# Patient Record
Sex: Female | Born: 1935 | Race: White | Hispanic: No | Smoking: Current every day smoker
Health system: Southern US, Community
[De-identification: ages and names within clinical notes are randomized; demographics above are authoritative.]

## PROBLEM LIST (undated history)

## (undated) DIAGNOSIS — R911 Solitary pulmonary nodule: Secondary | ICD-10-CM

## (undated) DIAGNOSIS — I480 Paroxysmal atrial fibrillation: Secondary | ICD-10-CM

## (undated) DIAGNOSIS — J449 Chronic obstructive pulmonary disease, unspecified: Secondary | ICD-10-CM

## (undated) DIAGNOSIS — Z720 Tobacco use: Secondary | ICD-10-CM

## (undated) HISTORY — PX: SKIN GRAFT: SHX250

## (undated) HISTORY — PX: PACEMAKER INSERTION: SHX728

## (undated) HISTORY — DX: Solitary pulmonary nodule: R91.1

## (undated) HISTORY — PX: SOFT TISSUE TUMOR RESECTION: SHX1054

## (undated) HISTORY — DX: Paroxysmal atrial fibrillation: I48.0

## (undated) HISTORY — DX: Tobacco use: Z72.0

## (undated) HISTORY — DX: Chronic obstructive pulmonary disease, unspecified: J44.9

---

## 1997-12-14 ENCOUNTER — Ambulatory Visit (HOSPITAL_COMMUNITY): Admission: RE | Admit: 1997-12-14 | Discharge: 1997-12-14 | Payer: Self-pay | Admitting: Cardiology

## 1999-04-09 ENCOUNTER — Ambulatory Visit (HOSPITAL_COMMUNITY): Admission: RE | Admit: 1999-04-09 | Discharge: 1999-04-09 | Payer: Self-pay | Admitting: Family Medicine

## 1999-04-09 ENCOUNTER — Encounter: Payer: Self-pay | Admitting: Family Medicine

## 1999-05-11 ENCOUNTER — Other Ambulatory Visit: Admission: RE | Admit: 1999-05-11 | Discharge: 1999-05-11 | Payer: Self-pay | Admitting: Family Medicine

## 1999-11-20 ENCOUNTER — Inpatient Hospital Stay (HOSPITAL_COMMUNITY): Admission: EM | Admit: 1999-11-20 | Discharge: 1999-11-23 | Payer: Self-pay | Admitting: *Deleted

## 2000-01-08 ENCOUNTER — Encounter: Admission: RE | Admit: 2000-01-08 | Discharge: 2000-01-08 | Payer: Self-pay | Admitting: Family Medicine

## 2000-01-08 ENCOUNTER — Encounter: Payer: Self-pay | Admitting: Family Medicine

## 2000-08-20 ENCOUNTER — Ambulatory Visit (HOSPITAL_COMMUNITY): Admission: RE | Admit: 2000-08-20 | Discharge: 2000-08-20 | Payer: Self-pay | Admitting: Cardiology

## 2000-09-09 ENCOUNTER — Ambulatory Visit (HOSPITAL_COMMUNITY): Admission: RE | Admit: 2000-09-09 | Discharge: 2000-09-09 | Payer: Self-pay | Admitting: Cardiology

## 2001-08-13 ENCOUNTER — Ambulatory Visit (HOSPITAL_COMMUNITY): Admission: RE | Admit: 2001-08-13 | Discharge: 2001-08-13 | Payer: Self-pay | Admitting: Cardiology

## 2001-10-16 ENCOUNTER — Encounter: Payer: Self-pay | Admitting: Family Medicine

## 2001-10-16 ENCOUNTER — Encounter: Admission: RE | Admit: 2001-10-16 | Discharge: 2001-10-16 | Payer: Self-pay | Admitting: Family Medicine

## 2001-11-12 ENCOUNTER — Ambulatory Visit (HOSPITAL_COMMUNITY): Admission: RE | Admit: 2001-11-12 | Discharge: 2001-11-12 | Payer: Self-pay | Admitting: Internal Medicine

## 2001-11-12 ENCOUNTER — Encounter: Payer: Self-pay | Admitting: Internal Medicine

## 2001-12-31 ENCOUNTER — Encounter (HOSPITAL_COMMUNITY): Admission: RE | Admit: 2001-12-31 | Discharge: 2002-03-31 | Payer: Self-pay | Admitting: Family Medicine

## 2002-04-14 ENCOUNTER — Encounter (HOSPITAL_COMMUNITY): Admission: RE | Admit: 2002-04-14 | Discharge: 2002-07-13 | Payer: Self-pay | Admitting: Family Medicine

## 2002-04-16 ENCOUNTER — Encounter: Payer: Self-pay | Admitting: Internal Medicine

## 2002-04-16 ENCOUNTER — Ambulatory Visit (HOSPITAL_COMMUNITY): Admission: RE | Admit: 2002-04-16 | Discharge: 2002-04-16 | Payer: Self-pay | Admitting: Internal Medicine

## 2002-09-29 ENCOUNTER — Encounter: Admission: RE | Admit: 2002-09-29 | Discharge: 2002-09-29 | Payer: Self-pay | Admitting: Family Medicine

## 2002-09-29 ENCOUNTER — Encounter: Payer: Self-pay | Admitting: Family Medicine

## 2003-07-04 ENCOUNTER — Encounter: Admission: RE | Admit: 2003-07-04 | Discharge: 2003-07-04 | Payer: Self-pay | Admitting: Internal Medicine

## 2003-09-07 ENCOUNTER — Encounter: Admission: RE | Admit: 2003-09-07 | Discharge: 2003-09-07 | Payer: Self-pay | Admitting: Family Medicine

## 2004-11-12 ENCOUNTER — Encounter: Admission: RE | Admit: 2004-11-12 | Discharge: 2004-11-12 | Payer: Self-pay | Admitting: General Surgery

## 2004-11-13 ENCOUNTER — Encounter (INDEPENDENT_AMBULATORY_CARE_PROVIDER_SITE_OTHER): Payer: Self-pay | Admitting: *Deleted

## 2004-11-13 ENCOUNTER — Ambulatory Visit (HOSPITAL_COMMUNITY): Admission: RE | Admit: 2004-11-13 | Discharge: 2004-11-13 | Payer: Self-pay | Admitting: General Surgery

## 2004-11-13 ENCOUNTER — Ambulatory Visit (HOSPITAL_BASED_OUTPATIENT_CLINIC_OR_DEPARTMENT_OTHER): Admission: RE | Admit: 2004-11-13 | Discharge: 2004-11-13 | Payer: Self-pay | Admitting: General Surgery

## 2004-11-21 ENCOUNTER — Encounter: Admission: RE | Admit: 2004-11-21 | Discharge: 2004-11-21 | Payer: Self-pay | Admitting: General Surgery

## 2004-12-11 ENCOUNTER — Ambulatory Visit: Payer: Self-pay | Admitting: Internal Medicine

## 2005-04-06 ENCOUNTER — Inpatient Hospital Stay (HOSPITAL_COMMUNITY): Admission: EM | Admit: 2005-04-06 | Discharge: 2005-04-10 | Payer: Self-pay | Admitting: *Deleted

## 2005-04-08 ENCOUNTER — Encounter (INDEPENDENT_AMBULATORY_CARE_PROVIDER_SITE_OTHER): Payer: Self-pay | Admitting: Cardiology

## 2005-04-26 ENCOUNTER — Ambulatory Visit: Payer: Self-pay | Admitting: Internal Medicine

## 2005-04-30 ENCOUNTER — Ambulatory Visit: Payer: Self-pay | Admitting: Cardiology

## 2005-06-10 ENCOUNTER — Ambulatory Visit: Payer: Self-pay | Admitting: Internal Medicine

## 2005-08-19 ENCOUNTER — Ambulatory Visit: Payer: Self-pay | Admitting: Internal Medicine

## 2005-11-07 ENCOUNTER — Other Ambulatory Visit: Admission: RE | Admit: 2005-11-07 | Discharge: 2005-11-07 | Payer: Self-pay | Admitting: Family Medicine

## 2005-11-08 ENCOUNTER — Encounter: Admission: RE | Admit: 2005-11-08 | Discharge: 2005-11-08 | Payer: Self-pay | Admitting: Family Medicine

## 2005-11-19 ENCOUNTER — Ambulatory Visit: Payer: Self-pay | Admitting: Internal Medicine

## 2005-11-20 ENCOUNTER — Ambulatory Visit: Payer: Self-pay | Admitting: Cardiology

## 2006-03-20 ENCOUNTER — Ambulatory Visit: Payer: Self-pay | Admitting: Internal Medicine

## 2006-04-10 ENCOUNTER — Encounter (HOSPITAL_COMMUNITY): Admission: RE | Admit: 2006-04-10 | Discharge: 2006-07-09 | Payer: Self-pay | Admitting: Internal Medicine

## 2006-06-03 ENCOUNTER — Inpatient Hospital Stay (HOSPITAL_COMMUNITY): Admission: EM | Admit: 2006-06-03 | Discharge: 2006-06-05 | Payer: Self-pay | Admitting: Emergency Medicine

## 2006-06-04 ENCOUNTER — Encounter (INDEPENDENT_AMBULATORY_CARE_PROVIDER_SITE_OTHER): Payer: Self-pay | Admitting: Interventional Cardiology

## 2006-06-15 ENCOUNTER — Inpatient Hospital Stay (HOSPITAL_COMMUNITY): Admission: EM | Admit: 2006-06-15 | Discharge: 2006-06-19 | Payer: Self-pay | Admitting: Emergency Medicine

## 2006-06-15 ENCOUNTER — Ambulatory Visit: Payer: Self-pay | Admitting: Internal Medicine

## 2006-06-15 ENCOUNTER — Encounter (INDEPENDENT_AMBULATORY_CARE_PROVIDER_SITE_OTHER): Payer: Self-pay | Admitting: Cardiology

## 2006-07-09 ENCOUNTER — Ambulatory Visit: Payer: Self-pay

## 2006-07-16 ENCOUNTER — Ambulatory Visit: Payer: Self-pay | Admitting: Internal Medicine

## 2006-09-18 ENCOUNTER — Ambulatory Visit: Payer: Self-pay | Admitting: Internal Medicine

## 2007-03-17 ENCOUNTER — Ambulatory Visit: Payer: Self-pay | Admitting: Internal Medicine

## 2007-09-11 DIAGNOSIS — J439 Emphysema, unspecified: Secondary | ICD-10-CM

## 2007-09-11 DIAGNOSIS — I4891 Unspecified atrial fibrillation: Secondary | ICD-10-CM

## 2007-09-11 DIAGNOSIS — F17200 Nicotine dependence, unspecified, uncomplicated: Secondary | ICD-10-CM | POA: Insufficient documentation

## 2007-09-11 DIAGNOSIS — J984 Other disorders of lung: Secondary | ICD-10-CM | POA: Insufficient documentation

## 2007-10-01 ENCOUNTER — Ambulatory Visit: Payer: Self-pay | Admitting: Internal Medicine

## 2007-10-08 ENCOUNTER — Encounter: Payer: Self-pay | Admitting: Internal Medicine

## 2007-12-29 ENCOUNTER — Telehealth: Payer: Self-pay | Admitting: Internal Medicine

## 2007-12-29 ENCOUNTER — Ambulatory Visit: Payer: Self-pay | Admitting: Internal Medicine

## 2007-12-29 DIAGNOSIS — J069 Acute upper respiratory infection, unspecified: Secondary | ICD-10-CM | POA: Insufficient documentation

## 2008-03-29 ENCOUNTER — Ambulatory Visit: Payer: Self-pay | Admitting: Internal Medicine

## 2008-09-26 ENCOUNTER — Ambulatory Visit: Payer: Self-pay | Admitting: Internal Medicine

## 2009-03-06 ENCOUNTER — Ambulatory Visit: Payer: Self-pay | Admitting: Internal Medicine

## 2009-04-05 ENCOUNTER — Ambulatory Visit: Payer: Self-pay | Admitting: Internal Medicine

## 2009-05-05 ENCOUNTER — Ambulatory Visit: Payer: Self-pay | Admitting: Internal Medicine

## 2009-09-04 ENCOUNTER — Ambulatory Visit: Payer: Self-pay | Admitting: Internal Medicine

## 2009-09-13 ENCOUNTER — Telehealth: Payer: Self-pay | Admitting: Internal Medicine

## 2009-11-27 ENCOUNTER — Telehealth: Payer: Self-pay | Admitting: Internal Medicine

## 2010-03-05 ENCOUNTER — Ambulatory Visit: Payer: Self-pay | Admitting: Internal Medicine

## 2010-04-02 ENCOUNTER — Ambulatory Visit: Payer: Self-pay | Admitting: Internal Medicine

## 2010-04-09 ENCOUNTER — Ambulatory Visit: Payer: Self-pay | Admitting: Internal Medicine

## 2010-06-14 LAB — PROTIME-INR

## 2010-07-20 ENCOUNTER — Ambulatory Visit
Admission: RE | Admit: 2010-07-20 | Discharge: 2010-07-20 | Payer: Self-pay | Source: Home / Self Care | Attending: Internal Medicine | Admitting: Internal Medicine

## 2010-07-20 ENCOUNTER — Encounter: Payer: Self-pay | Admitting: Internal Medicine

## 2010-07-27 ENCOUNTER — Telehealth (INDEPENDENT_AMBULATORY_CARE_PROVIDER_SITE_OTHER): Payer: Self-pay | Admitting: *Deleted

## 2010-08-08 ENCOUNTER — Ambulatory Visit: Admission: RE | Admit: 2010-08-08 | Discharge: 2010-08-08 | Payer: Self-pay | Source: Home / Self Care

## 2010-08-08 LAB — CONVERTED CEMR LAB: POC INR: 2.2

## 2010-08-14 NOTE — Progress Notes (Signed)
Summary: need to change xopenex to Maxair  Phone Note Outgoing Call   Call placed by: Carron Curie CMA,  Nov 27, 2009 11:20 AM Call placed to: insurance Summary of Call: Insurance comapny is requiring pt try MAxair before Xopenex will be approved. Pelase advsie if ok to change to maxair, if so please provide directions. Thanks.  Initial call taken by: Carron Curie CMA,  Nov 27, 2009 11:21 AM  Follow-up for Phone Call        OK to try Maxair, # 1, 2 puffs four times a day as needed rescue inhaler, refill as needed. Follow-up by: Waymon Budge MD,  Nov 28, 2009 10:08 AM  Additional Follow-up for Phone Call Additional follow up Details #1::        pt advised of change and rx sent.Carron Curie CMA  Nov 28, 2009 10:21 AM     New/Updated Medications: MAXAIR AUTOHALER 200 MCG/INH AERB (PIRBUTEROL ACETATE) 2 puffs four times a day as needed Prescriptions: MAXAIR AUTOHALER 200 MCG/INH AERB (PIRBUTEROL ACETATE) 2 puffs four times a day as needed  #1 x prn   Entered by:   Carron Curie CMA   Authorized by:   Waymon Budge MD   Signed by:   Carron Curie CMA on 11/28/2009   Method used:   Electronically to        [id number] (retail)       [address]       [place], [place]  [address]       Ph: [phone] or [phone]       Fax: [phone]   RxID:   [id number]

## 2010-08-14 NOTE — Assessment & Plan Note (Signed)
Summary: NP follow up - COPD   Primary Provider/Referring Provider:  Arvilla Market  CC:  1 week follow up - still prod cough and congestion.  finished abx and pred taper yesterday.  History of Present Illness: 03/06/09- COPD, CAD Acutely ill past 3 days- sore throat, then weakness, cough and dyspnea. Anorexia without nausea, vomiting or diarrhea. Denies fever, chills, headache. Scant thick wihite sputum.   May 05, 2009- COPD, CAD Needed to return to NP after my last visit, but feels she is doing well now. Today is baseline and she expects most days to have cough and phlegm. We discussed a trial of a cortisone inhaler with or instead of Spiriva. Wants flu vax.  September 04, 2009- COPD, CAD Pleased that she got flu vax. Breathing has been stable. Not worse.  Off coumadin. She uses Xopenex HFA maybe twice daily. She thinks Qvar is helping more than the Spiriva did. .Denies chest pains or productive cough. Dyspnea on stairs.  March 05, 2010- - COPD, CAD, tobacco Insurance wouldn't cover Xopenex, so we folowed their lead and switched to Maxair, used about twice daily.. She says this is too expensive. She had ablation then a pacemaker, so the Xopenex issue should no longer be important. Uses Qvar every evening. In last 2 months has had productive cough with clear mucus, but denies wheeze. She feels DOE is gradually worse, but is comfortable at rest or sleep. She continues to smoke 1 PPD despie all our efforts over the years. i explained this as a major reason for productive cough and worsening dyspnea.  April 02, 2010--Presents for an acute office visit. Complains of sore throat, fatigue, increased SOB,  prod cough, wheezing, chills/sweats x5days. Has had low grade fever.>>tx w/  augmentin/steroid taper.   April 09, 2010---Presents for 1 week follow up - still prod cough, congestion.  finished abx and pred taper yesterday She feels better but not totally gone. Feels cough is just lingering  on, wearing her down. Wheezing and dsypnea are less but not totally gone. Mucus is clearing. Denies chest pain,  orthopnea, hemoptysis, fever, n/v/d, edema, headache.   Medications Prior to Update: 1)  Coumadin 5 Mg  Tabs (Warfarin Sodium) .... As Directed 2)  Hydrochlorothiazide 25 Mg  Tabs (Hydrochlorothiazide) .... Take 1/2 Tablet By Mouth Once Daily 3)  Klor-Con 10 10 Meq  Tbcr (Potassium Chloride) .... Take 1 Tablet By Mouth Once A Day 4)  Maxair Autohaler 200 Mcg/inh Aerb (Pirbuterol Acetate) .... 2 Puffs Four Times A Day As Needed 5)  Qvar 80 Mcg/act Aers (Beclomethasone Dipropionate) .... 2 Puffs and Rinse Twice Every Day 6)  Lexapro 10 Mg Tabs (Escitalopram Oxalate) .... Take 1 By Mouth Once Daily 7)  Prednisone 10 Mg Tabs (Prednisone) .... 4 Tabs For 2 Days, Then 3 Tabs For 2 Days, 2 Tabs For 2 Days, Then 1 Tab For 2 Days, Then Stop 8)  Augmentin 875-125 Mg Tabs (Amoxicillin-Pot Clavulanate) .Marland Kitchen.. 1 By Mouth Two Times A Day 9)  Hydromet 5-1.5 Mg/71ml Syrp (Hydrocodone-Homatropine) .... 1/2-1 Tsp Every 4-6 Hr As Needed Cough, May Make You Sleepy  Current Medications (verified): 1)  Coumadin 5 Mg  Tabs (Warfarin Sodium) .... As Directed 2)  Hydrochlorothiazide 25 Mg  Tabs (Hydrochlorothiazide) .... Take 1/2 Tablet By Mouth Once Daily 3)  Klor-Con 10 10 Meq  Tbcr (Potassium Chloride) .... Take 1 Tablet By Mouth Once A Day 4)  Maxair Autohaler 200 Mcg/inh Aerb (Pirbuterol Acetate) .... 2 Puffs Four Times A Day As Needed  5)  Qvar 80 Mcg/act Aers (Beclomethasone Dipropionate) .... 2 Puffs and Rinse Twice Every Day 6)  Lexapro 10 Mg Tabs (Escitalopram Oxalate) .... Take 1 By Mouth Once Daily 7)  Hydromet 5-1.5 Mg/39ml Syrp (Hydrocodone-Homatropine) .... 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  Family History: Last updated: 12/29/2007 negative respiratory disease  Social History: Last updated: 04/02/2010 Patient is a current smoker 1ppd, x60yrs- counseling Divorced, one daughter 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  finished abx and pred taper yesterday Is Patient Diabetic? No Comments Medications reviewed with patient Daytime contact number verified with patient. Boone Master CNA/MA  April 09, 2010 11:24 AM    Physical Exam  Additional Exam:  General: A/Ox3; pleasant and cooperative, NAD,  SKIN: persistent small vasular lesions or avms on face- mala. NODES: no lymphadenopathy HEENT: [place]/AT, EOM- WNL, Conjuctivae- clear, PERRLA, TM-WNL, Nose- clear, Throat- clear and wnl, Mallampati II, dentures NECK: Supple w/ fair ROM, JVD- none, normal carotid impulses w/o bruits Thyroid- CHEST: coarse BS w/ no wheezing.  HEART: RRR, no m/g/r heard ABDOMEN: Soft and nl;  ZOX:WRUE, nl pulses, no edema  NEURO: Grossly intact to observation      Impression & Recommendations:  Problem # 1:  COPD (ICD-496) Exacerbation -slow to resolve.  Plan:  depo medrol IM , and xopenex neb in  office  Mucinex DM two times a day as needed cough/congestion Hydromet 1/2 -1 tsp every 4-6 hrs as needed cough, may make you sleepy.  Increase fluids.  and rest.   Please contact office for sooner follow up if symptoms do not improve or worsen  follow up 4  weeks with Dr. Maple Hudson and as needed   Complete Medication List: 1)  Coumadin 5 Mg Tabs (Warfarin sodium) .... As directed 2)  Hydrochlorothiazide 25 Mg Tabs (Hydrochlorothiazide) .... Take 1/2 tablet by mouth once daily 3)  Klor-con 10 10 Meq Tbcr (Potassium chloride) .... Take 1 tablet by mouth once a day 4)  Maxair Autohaler 200 Mcg/inh Aerb (Pirbuterol acetate) .... 2 puffs four times a day as needed 5)  Qvar 80 Mcg/act Aers (Beclomethasone dipropionate) .... 2 puffs and rinse twice every day 6)  Lexapro 10 Mg Tabs (Escitalopram oxalate) .... Take 1 by mouth once daily 7)  Hydromet 5-1.5 Mg/63ml Syrp (Hydrocodone-homatropine) .... 1/2-1 tsp every 4-6 hr as needed cough, may make you sleepy  Other Orders: Admin of Therapeutic Inj  intramuscular or subcutaneous (58527) Depo- Medrol 80mg  (J1040) Depo- Medrol 40mg  (J1030) Nebulizer Tx (78242) Est. Patient Level IV (35361)  Patient Instructions: 1)  Mucinex DM two times a day as needed cough/congestion 2)  Hydromet 1/2 -1 tsp every 4-6 hrs as needed cough, may make you sleepy.  3)  Increase fluids.  and rest.  4)   Please contact office for sooner follow up if symptoms do not improve or worsen  5)  follow up 4  weeks with Dr. Maple Hudson and as needed      Medication Administration  Injection # 1:    Medication: Depo- Medrol 40mg     Diagnosis: COPD (ICD-496)    Route: IM    Site: LUOQ gluteus    Exp Date: 10-2012    Lot #: [id number]    Mfr: [employer]    Comments: injection given by Reynaldo Minium, CMA    Patient tolerated injection without complications  Injection # 2:    Medication: Depo- Medrol 80mg     Diagnosis: COPD (ICD-496)    Route: IM    Site: LUOQ gluteus    Exp Date:  10-2012    Lot #: [id number]    Mfr: [employer]    Comments: injection given by Reynaldo Minium, CMA    Patient tolerated injection without complications  Orders Added: 1)  Admin of Therapeutic Inj  intramuscular or subcutaneous [96372] 2)  Depo- Medrol 80mg  [J1040] 3)  Depo- Medrol 40mg  [J1030] 4)  Nebulizer Tx [44315] 5)  Est. Patient Level IV [40086]

## 2010-08-14 NOTE — Assessment & Plan Note (Signed)
Summary: 6 months/apc   Primary Provider/Referring Provider:  Arvilla Market  CC:  Follow up visit.  History of Present Illness:  09/26/08- COPDCAD Main c/o dyspnea and "legs like jelly". Failed pulmonary rehab when it aggravated back trouble. Breathing limits her walking. Peripheral vascular diease she says was ruled out. Heart/ pacemaker is stable. Little routine cough and no chest pain. Feels comforatably stable within her limiting dyspnea.  03/06/09- COPD, CAD Acutely ill past 3 days- sore throat, then weakness, cough and dyspnea. Anorexia without nausea, vomiting or diarrhea. Denies fever, chills, headache. Scant thick wihite sputum.   May 05, 2009- COPD, CAD Needed to return to NP after my last visit, but feels she is doing well now. Today is baseline and she expects most days to have cough and phlegm. We discussed a trial of a cortisone inhaler with or instead of Spiriva. Wants flu vax.  September 04, 2009- COPD, CAD Pleased that she got flu vax. Breathing has been stable. Not worse.  Off coumadin. She uses Xopenex HFA maybe twice daily. She thinks Qvar is helping more than the Spiriva did .Denies chest pains or productive cough. Dyspnea on stairs.     Current Medications (verified): 1)  Coumadin 5 Mg  Tabs (Warfarin Sodium) .... As Directed 2)  Hydrochlorothiazide 25 Mg  Tabs (Hydrochlorothiazide) .... Take 1/2 Tablet By Mouth Once Daily 3)  Klor-Con 10 10 Meq  Tbcr (Potassium Chloride) .... Take 1 Tablet By Mouth Once A Day 4)  Xopenex Hfa 45 Mcg/act  Aero (Levalbuterol Tartrate) .... Inhale 2 Puffs Every 4 Hours As Needed 5)  Qvar 80 Mcg/act Aers (Beclomethasone Dipropionate) .... 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  The patient denies anorexia, fever, weight loss, weight gain, vision loss, decreased hearing, hoarseness, chest pain, syncope, peripheral edema, prolonged cough, headaches, hemoptysis, abdominal pain, and severe indigestion/heartburn.    Vital Signs:  Patient profile:   77 year old female Height:      64.5 inches Weight:      180.38 pounds O2 Sat:      98 % on Room air Pulse rate:   86 / minute BP sitting:   104 / 78  (left arm) Cuff size:   large  Vitals Entered By: Reynaldo Minium CMA (September 04, 2009 10:44 AM)  O2 Flow:  Room air  Physical Exam  Additional Exam:  General: A/Ox3; pleasant and cooperative, NAD, looks  comfortable and seems cheerier than she has in a while. SKIN: persistent small vasular lesions or avms on face- mala. NODES: no lymphadenopathy HEENT: [place]/AT, EOM- WNL, Conjuctivae- clear, PERRLA, TM-WNL, Nose- clear, Throat- clear and wnl, Melampatti II, dentures NECK: Supple w/ fair ROM, JVD- none, normal carotid impulses w/o bruits Thyroid- CHEST:Minimal cough and trace  endexpiratory wheeze HEART: RRR, no m/g/r heard ABDOMEN: Soft and nl;  VWU:[id number], nl pulses, no edema  NEURO: Grossly intact to observation      Impression & Recommendations:  Problem # 1:  COPD (ICD-496) Very stable despite ongoing smoking with little change, despite discussions and education on medical issues  and  support.  Problem # 2:  PULMONARY NODULE (ICD-518.89)  CXR 3/10 showed pacemaker but did not see the nodule. We wil update the cxr.  Other Orders: Est. Patient Level III (91478) T-2 View CXR (71020TC)  Patient Instructions: 1)  Please schedule a follow-up appointment in 6 months. 2)  A chest x-ray has been recommended.  Your imaging study may require preauthorization.  3)  Please do yourself a favor and work hard to quit the smoking.

## 2010-08-14 NOTE — Assessment & Plan Note (Signed)
Summary: Acute NP office visit - COPD   Primary Provider/Referring Provider:  Arvilla Market  CC:  sore throat, fatigue, increased SOB, prod cough, wheezing, and chills/sweats x5days - .  History of Present Illness: 03/06/09- COPD, CAD Acutely ill past 3 days- sore throat, then weakness, cough and dyspnea. Anorexia without nausea, vomiting or diarrhea. Denies fever, chills, headache. Scant thick wihite sputum.   May 05, 2009- COPD, CAD Needed to return to NP after my last visit, but feels she is doing well now. Today is baseline and she expects most days to have cough and phlegm. We discussed a trial of a cortisone inhaler with or instead of Spiriva. Wants flu vax.  September 04, 2009- COPD, CAD Pleased that she got flu vax. Breathing has been stable. Not worse.  Off coumadin. She uses Xopenex HFA maybe twice daily. She thinks Qvar is helping more than the Spiriva did. .Denies chest pains or productive cough. Dyspnea on stairs.  March 05, 2010- - COPD, CAD, tobacco Insurance wouldn't cover Xopenex, so we folowed their lead and switched to Maxair, used about twice daily.. She says this is too expensive. She had ablation then a pacemaker, so the Xopenex issue should no longer be important. Uses Qvar every evening. In last 2 months has had productive cough with clear mucus, but denies wheeze. She feels DOE is gradually worse, but is comfortable at rest or sleep. She continues to smoke 1 PPD despie all our efforts over the years. i explained this as a major reason for productive cough and worsening dyspnea.  April 02, 2010--Presents for an acute office visit. Complains of sore throat, fatigue, increased SOB,  prod cough, wheezing, chills/sweats x5days. Has had low grade fever. Denies chest pain,  orthopnea, hemoptysis, n/v/d, edema, headache. No otc used. Contnues to smoke.    Medications Prior to Update: 1)  Coumadin 5 Mg  Tabs (Warfarin Sodium) .... As Directed 2)  Hydrochlorothiazide 25  Mg  Tabs (Hydrochlorothiazide) .... Take 1/2 Tablet By Mouth Once Daily 3)  Klor-Con 10 10 Meq  Tbcr (Potassium Chloride) .... Take 1 Tablet By Mouth Once A Day 4)  Maxair Autohaler 200 Mcg/inh Aerb (Pirbuterol Acetate) .... 2 Puffs Four Times A Day As Needed 5)  Qvar 80 Mcg/act Aers (Beclomethasone Dipropionate) .... 2 Puffs and Rinse Twice Every Day 6)  Lexapro 10 Mg Tabs (Escitalopram Oxalate) .... Take 1 By Mouth Once Daily  Current Medications (verified): 1)  Coumadin 5 Mg  Tabs (Warfarin Sodium) .... As Directed 2)  Hydrochlorothiazide 25 Mg  Tabs (Hydrochlorothiazide) .... Take 1/2 Tablet By Mouth Once Daily 3)  Klor-Con 10 10 Meq  Tbcr (Potassium Chloride) .... Take 1 Tablet By Mouth Once A Day 4)  Maxair Autohaler 200 Mcg/inh Aerb (Pirbuterol Acetate) .... 2 Puffs Four Times A Day As Needed 5)  Qvar 80 Mcg/act Aers (Beclomethasone Dipropionate) .... 2 Puffs and Rinse Twice Every Day 6)  Lexapro 10 Mg Tabs (Escitalopram Oxalate) .... 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  Family History: Last updated: 12/29/2007 negative respiratory disease  Social History: Last updated: 04/02/2010 Patient is a current smoker 1ppd, x64yrs- counseling Divorced,  one daughter 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: Smoking Status: current (03/05/2010) Packs/Day: 1.0 (03/05/2010)  Social History: Patient is a current smoker 1ppd, x3yrs- counseling Divorced, one daughter 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 No Comments Medications reviewed with patient Daytime contact number verified with patient. Boone Master CNA/MA  April 02, 2010 3:29 PM    Physical Exam  Additional Exam:  General: A/Ox3; pleasant and cooperative, NAD,  SKIN: persistent small vasular lesions or avms on face- mala. NODES: no lymphadenopathy HEENT: [place]/AT, EOM- WNL, Conjuctivae- clear, PERRLA, TM-WNL, Nose- clear, Throat- clear and wnl, Mallampati II, dentures NECK: Supple w/ fair ROM, JVD- none, normal carotid impulses w/o bruits Thyroid- CHEST: coarse BS w/ no wheezing.  HEART: RRR, no m/g/r heard ABDOMEN: Soft and nl;  UJW:[id number], nl pulses, no edema  NEURO: Grossly intact to observation      Impression & Recommendations:  Problem # 1:  COPD (ICD-496) COPD flare w/ associated URI  Plan:  Augmentin 875mg  two times a day for 7 days Mucinex DM two times a day as needed cough/congestion Prednisone taper over next week.  Hydromet 1/2 -1 tsp every 4-6 hrs as needed cough, may make you sleepy.  Increase fluids.  and rest.  Tylenol as needed fever.  Please contact office for sooner follow up if symptoms do not improve or worsen  follow up 1 week. and as needed  Her updated medication list for this problem includes:    Hydromet 5-1.5 Mg/62ml Syrp (Hydrocodone-homatropine) .Marland Kitchen... 1/2-1 tsp every 4-6 hr as needed cough, may make you sleepy  Orders: Est. Patient Level III (47829)  Medications Added to Medication List This Visit: 1)  Prednisone 10 Mg Tabs (Prednisone) .... 4 tabs for 2 days,  then 3 tabs for 2 days, 2 tabs for 2 days, then 1 tab for 2 days, then stop 2)  Augmentin 875-125 Mg Tabs (Amoxicillin-pot clavulanate) .Marland Kitchen.. 1 by mouth two times a day 3)  Hydromet 5-1.5 Mg/35ml Syrp (Hydrocodone-homatropine) .... 1/2-1 tsp every 4-6 hr as needed cough, may make you sleepy  Complete Medication List: 1)  Coumadin 5 Mg Tabs (Warfarin sodium) .... As directed 2)  Hydrochlorothiazide 25 Mg Tabs (Hydrochlorothiazide) .... Take 1/2 tablet by mouth once daily 3)  Klor-con 10 10 Meq Tbcr (Potassium chloride) .... Take 1 tablet by mouth once a day 4)  Maxair Autohaler 200 Mcg/inh Aerb (Pirbuterol acetate) .... 2 puffs four times a day as needed 5)  Qvar 80 Mcg/act Aers (Beclomethasone dipropionate) .... 2 puffs and rinse twice every day 6)  Lexapro 10 Mg Tabs (Escitalopram oxalate) .... Take 1 by mouth once daily 7)  Prednisone 10 Mg Tabs (Prednisone) .... 4 tabs for 2 days, then 3 tabs for 2 days, 2 tabs for 2 days, then 1 tab for 2 days, then stop 8)  Augmentin  875-125 Mg Tabs (Amoxicillin-pot clavulanate) .Marland Kitchen.. 1 by mouth two times a day 9)  Hydromet 5-1.5 Mg/54ml Syrp (Hydrocodone-homatropine) .... 1/2-1 tsp every 4-6 hr as needed cough, may make you sleepy  Patient Instructions: 1)  Augmentin 875mg  two times a day for 7 days 2)  Mucinex DM two times a day as needed cough/congestion 3)  Prednisone taper over next week.  4)  Hydromet 1/2 -1 tsp every 4-6 hrs as needed cough, may make you sleepy.  5)  Increase fluids.  and rest.  6)  Tylenol as needed fever.  7)  Please contact office for sooner follow up if symptoms do not improve or worsen  8)  follow up 1 week. and as needed  Prescriptions: HYDROMET 5-1.5 MG/5ML SYRP (HYDROCODONE-HOMATROPINE) 1/2-1 tsp every 4-6 hr as needed cough, may make you sleepy  #8 oz x 0   Entered and Authorized by:   Rubye Oaks NP   Signed by:   Tammy Parrett NP on 04/02/2010   Method used:   Print then Give to Patient   RxID:    [id number] AUGMENTIN 875-125 MG TABS (AMOXICILLIN-POT CLAVULANATE) 1 by mouth two times a day  #14 x 0   Entered and Authorized by:   Rubye Oaks NP   Signed by:   Tammy Parrett NP on 04/02/2010   Method used:   Electronically to        [id number]       [address]       [place], [place]  [address]       Ph: [phone] or [phone]       Fax: [phone]   RxID:   [id number] PREDNISONE 10 MG TABS (PREDNISONE) 4 tabs for 2 days, then 3 tabs for 2 days, 2 tabs for 2 days, then 1 tab for 2 days, then stop  #20 x 0   Entered and Authorized by:   Rubye Oaks NP   Signed by:   Tammy Parrett NP on 04/02/2010   Method used:   Electronically to        [id number] ([facility]       [address]       [place], [place]  [address]       Ph: [phone] or [phone]       Fax: [phone]   RxID:   [id number]

## 2010-08-14 NOTE — Assessment & Plan Note (Signed)
Summary: 6 months/apc   Primary Provider/Referring Provider:  Arvilla Market  CC:  6 month follow up visit-COPd; SOB and wheezing all the time but increases with activity..  History of Present Illness: 03/06/09- COPD, CAD Acutely ill past 3 days- sore throat, then weakness, cough and dyspnea. Anorexia without nausea, vomiting or diarrhea. Denies fever, chills, headache. Scant thick wihite sputum.   May 05, 2009- COPD, CAD Needed to return to NP after my last visit, but feels she is doing well now. Today is baseline and she expects most days to have cough and phlegm. We discussed a trial of a cortisone inhaler with or instead of Spiriva. Wants flu vax.  September 04, 2009- COPD, CAD Pleased that she got flu vax. Breathing has been stable. Not worse.  Off coumadin. She uses Xopenex HFA maybe twice daily. She thinks Qvar is helping more than the Spiriva did. .Denies chest pains or productive cough. Dyspnea on stairs.  March 05, 2010- - COPD, CAD, tobacco Insurance wouldn't cover Xopenex, so we folowed their lead and switched to Maxair, used about twice daily.. She says this is too expensive. She had ablation then a pacemaker, so the Xopenex issue should no longer be important. Uses Qvar every evening. In last 2 months has had productive cough with clear mucus, but denies wheeze. She feels DOE is gradually worse, but is comfortable at rest or sleep. She continues to smoke 1 PPD despie all our efforts over the years. i explained this as a major reason for productive cough and worsening dyspnea.    Preventive Screening-Counseling & Management  Alcohol-Tobacco     Smoking Status: current     Smoking Cessation Counseling: yes     Packs/Day: 1.0     Year Started: 1959     Tobacco Counseling: to quit use of tobacco products  Comments: Referred to [facility] smoking cessation program.  Current Medications (verified): 1)  Coumadin 5 Mg  Tabs (Warfarin Sodium) .... As Directed 2)   Hydrochlorothiazide 25 Mg  Tabs (Hydrochlorothiazide) .... Take 1/2 Tablet By Mouth Once Daily 3)  Klor-Con 10 10 Meq  Tbcr (Potassium Chloride) .... Take 1 Tablet By Mouth Once A Day 4)  Maxair Autohaler 200 Mcg/inh Aerb (Pirbuterol Acetate) .... 2 Puffs Four Times A Day As Needed 5)  Qvar 80 Mcg/act Aers (Beclomethasone Dipropionate) .... 2 Puffs and Rinse Twice Every Day 6)  Lexapro 10 Mg Tabs (Escitalopram Oxalate) .... 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  The patient denies shortness of breath at rest, non-productive cough, coughing up blood, chest pain, irregular heartbeats, acid heartburn, indigestion, loss of appetite, weight change, abdominal pain, difficulty swallowing, sore throat, tooth/dental problems, headaches, nasal congestion/difficulty breathing through  nose, and sneezing.    Vital Signs:  Patient profile:   77 year old female Height:      64.5 inches Weight:      173 pounds BMI:     29.34 O2 Sat:      94 % on Room air Pulse rate:   86 / minute BP sitting:   140 / 78  (left arm) Cuff size:    regular  Vitals Entered By: Reynaldo Minium CMA (March 05, 2010 11:26 AM)  O2 Flow:  Room air CC: 6 month follow up visit-COPd; SOB and wheezing all the time but increases with activity.   Physical Exam  Additional Exam:  General: A/Ox3; pleasant and cooperative, NAD,  SKIN: persistent small vasular lesions or avms on face- mala. NODES: no lymphadenopathy HEENT: [place]/AT, EOM- WNL, Conjuctivae- clear, PERRLA, TM-WNL, Nose- clear, Throat- clear and wnl, Mallampati II, dentures NECK: Supple w/ fair ROM, JVD- none, normal carotid impulses w/o bruits Thyroid- CHEST: No cough or wheeze. Minimal dry crackle HEART: RRR, no m/g/r heard ABDOMEN: Soft and nl;  YQI:HKVQ, nl pulses, no edema  NEURO: Grossly intact to observation      Impression & Recommendations:  Problem # 1:  TOBACCO ABUSE (ICD-305.1)  Still smokes about one PPD. We again discussed medical issues and available support.  We are giving info on [facility] cessation program.  Problem # 2:  COPD (ICD-496) Fair control. There is gradual increase in bronchitis and dyspnea, partly related to the hot weather, but also to her ongoing smoking. We will let her try Proair as an alternaive to [employer] . She had failed trial od Spiriva.  Problem # 3:  PULMONARY NODULE (ICD-518.89) Nodule was not seen on last CXR in February as reviewed with her. We will check intermittently.  Medications Added to Medication List This Visit: 1)  Lexapro 10 Mg Tabs (Escitalopram oxalate) .... Take 1 by mouth once daily  Other Orders: Est. Patient Level IV (25956)  Patient Instructions: 1)  Please schedule a follow-up appointment in 6 months. 2)  Please call sooner if needed 3)  Flyer on [facility] smoking cessation program- please consider checking it out. 4)  Sample Proair rescue inhaler- albuterol- 2 puffs up to 4 times daily if needed as an alternative to Xopenex or Maxair. If you like it, ask your drug store what it would cost you.

## 2010-08-14 NOTE — Progress Notes (Signed)
Summary: returned call  Phone Note Call from Patient Call back at Home Phone [phone]   Caller: Patient Call For: young Summary of Call: pt returned call from [place].  Initial call taken by: Tivis Ringer, CNA,  September 13, 2009 10:33 AM  Follow-up for Phone Call        [facility].Reynaldo Minium CMA  September 13, 2009 1:57 PM   Additional Follow-up for Phone Call Additional follow up Details #1::        Pt aware of results.Reynaldo Minium CMA  September 13, 2009 4:01 PM

## 2010-08-16 NOTE — Assessment & Plan Note (Signed)
Summary: f3y   Visit Type:  3 year follow up Primary Provider:  Arvilla Market  CC:  shortness of breath-COPD.  History of Present Illness: [name] returns today after a long absence from our EP clinic.  She is a pleasant 77 yo woman with a h/o atrial flutter and atrial fib who had a very rapid ventricular response and underwent AV node ablation and BiV PPM.  From an arrhtymia perspective she has done well.  She denies c/p, or peripheral edema or syncope.  She has chronic dyspnea thought mostly due to her COPD.  She is still smoking cigarettes. No other complaints.  Problems Prior to Update: 1)  Uri, Acute  (ICD-465.9) 2)  Tobacco Abuse  (ICD-305.1) 3)  Atrial Fibrillation, Paroxysmal  (ICD-427.31) 4)  Pulmonary Nodule  (ICD-518.89) 5)  COPD  (ICD-496)  Current Medications (verified): 1)  Coumadin 5 Mg  Tabs (Warfarin Sodium) .... As Directed 2)  Hydrochlorothiazide 25 Mg  Tabs (Hydrochlorothiazide) .... Take 1/2 Tablet By Mouth Once Daily 3)  Klor-Con 10 10 Meq  Tbcr (Potassium Chloride) .... Take 1 Tablet By Mouth Once A Day 4)  Maxair Autohaler 200 Mcg/inh Aerb (Pirbuterol Acetate) .... 2 Puffs Four Times A Day As Needed 5)  Qvar 80 Mcg/act Aers (Beclomethasone Dipropionate) .... 2 Puffs and Rinse Twice Every Day 6)  Lexapro 10 Mg Tabs (Escitalopram Oxalate) .... Take 1 By Mouth Once Daily  Allergies (verified): 1)  ! 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  Vital Signs:  Patient profile:   77 year old female Height:      64.5 inches Weight:      167.25 pounds BMI:     28.37 Pulse rate:   88 / minute BP sitting:   127 / 86  (left arm) Cuff size:   regular  Vitals Entered By: Caralee Ates CMA (July 20, 2010 1:33 PM)  Physical Exam  General:  well developed, well nourished, in no acute distress Head:  normocephalic and atraumatic Eyes:   conjunctiva and sclera clear Mouth:  no deformity or lesions, sl. postnasal drip.   Neck:  no masses, thyromegaly, or abnormal cervical nodes Chest Wall:  Well healed PPM incision. Lungs:  decreased BS bilateral.  Expiratory wheezes bilaterally lower lobes. Heart:  irregular rhythm (hx. of A-fib with pacemaker), S1, S2 without murmurs, rubs, gallops, or clicks Abdomen:  bowel sounds positive; abdomen soft and non-tender without masses, or organomegaly Msk:  Back normal, normal gait. Muscle strength and tone normal. Pulses:  pulses normal in all 4 extremities Extremities:  No clubbing or cyanosis. Neurologic:  Alert and oriented x 3.   EKG  Procedure date:  07/20/2010  Findings:      Atrial fibrillation with a controlled ventricular response rate of: 81. Ventricle is paced.  PPM Follow Up Battery Voltage:  2.97 V     Battery Est. Longevity:  4.5 YRS     Right Ventricle  Amplitude: PACED mV, Impedance: 472 ohms, Threshold: 1.00 V at 0.40 msec Left Ventricle  Impedance: 892 ohms, Threshold: 1.50 V at 0.50 msec  Episodes MS Episodes:  0     Ventricular High Rate:  0     Ventricular Pacing:  99.9%  Parameters Mode:  VVIR     Lower Rate Limit:  70     Upper Rate Limit:  95 Next Remote Date:  10/18/2010     Next Cardiology  Appt Due:  07/16/2011 Tech Comments:  NORMAL DEVICE FUNCTION.  NO EPISODES SINCE LAST CHECK. NO CHANGES MADE. TRANSFERRING [facility] FROM [place]. CARELINK 10-18-10 AND ROV 12 MTHS W/GT. Vella Kohler  July 20, 2010 1:21 PM MD Comments:  Agree with above.  ICD Specifications Following MD:  Lewayne Bunting, MD     Impression & Recommendations:  Problem # 1:  ATRIAL FIBRILLATION, PAROXYSMAL (ICD-427.31) Her ventricular rate is well controlled. I will recheck in several months. Her updated medication list for this problem includes:    Coumadin 5 Mg Tabs (Warfarin sodium) .Marland Kitchen... As directed  Problem # 2:  COPD (ICD-496) She thinks that this is her biggest problem.  She will continue her inhalers. Her updated medication list for this problem includes:    Maxair Autohaler 200 Mcg/inh Aerb (Pirbuterol acetate) .Marland Kitchen... 2 puffs four times a day as needed    Qvar 80 Mcg/act Aers (Beclomethasone dipropionate) .Marland Kitchen... 2 puffs and rinse twice every day  Problem # 3:  TOBACCO ABUSE (ICD-305.1) I have asked her to count her cigarettes daily and to stop smoking by cutting back one cigaretted a day over a month.  Patient Instructions: 1)  Your physician recommends that you continue on your current medications as directed. Please refer to the Current Medication list given to you today. 2)  Your physician wants you to follow-up in: 6 months.  You will receive a reminder letter in the mail two months in advance. If you don't receive a letter, please call our office to schedule the follow-up appointment.  Appended Document: f3y pt is not enrolled in [facility].  Unable to enroll due to Device.  ROV in 6 mths w/device clinic. {USER.REALNAME}  {DATETIMESTAMP()}

## 2010-08-16 NOTE — Cardiovascular Report (Signed)
Summary: Office Visit   Office Visit   Imported By: Roderic Ovens 07/24/2010 13:47:49  _____________________________________________________________________  External Attachment:    Type:   Image     Comment:   External Document

## 2010-08-16 NOTE — Progress Notes (Signed)
Summary: inhaler prescription-LMTCB x 1  Phone Note Call from Patient Call back at Home Phone [phone]   Caller: Patient Call For: young Summary of Call: Pt states she tried to purchase a maxair auto inhaler and it cost sixty dollars, stated this was too expensive want to know if CY can prescribe something less expensive pls advise. Initial call taken by: Darletta Moll,  July 27, 2010 10:55 AM  Follow-up for Phone Call        Spoke with pt and she states unable to afford maxair and needs new rescue inhaler called in.  Has A fib so can not tolerate albuterol. Pls advise thanks Follow-up by: Vernie Murders,  July 27, 2010 11:19 AM  Additional Follow-up for Phone Call Additional follow up Details #1::        Her plan may favor a specific HFA rescue inhaler brand. Otherwise offer Proair, # 1, 2 puffs four times a day as needed  Additional Follow-up by: Waymon Budge MD,  July 27, 2010 12:43 PM    Additional Follow-up for Phone Call Additional follow up Details #2::    LMOMTCB Vernie Murders  July 27, 2010 1:55 PM   Spoke with pt and notified of the above recs per CDY.  Pt states that she would like proair, she found old sample and states that this worked well for her.  Rx was sent to pharm. Follow-up by: Vernie Murders,  July 27, 2010 3:13 PM  New/Updated Medications: PROAIR HFA 108 (90 BASE) MCG/ACT AERS (ALBUTEROL SULFATE) 2 puffs every 4-6 hrs as needed Prescriptions: PROAIR HFA 108 (90 BASE) MCG/ACT AERS (ALBUTEROL SULFATE) 2 puffs every 4-6 hrs as needed  #1 x 1   Entered by:   Vernie Murders   Authorized by:   Waymon Budge MD   Signed by:   Vernie Murders on 07/27/2010   Method used:   Electronically to        [id number] (retail)       [address]       [place], [place]  [address]       Ph: [phone] or [phone]       Fax: [phone]   RxID:   [id number]

## 2010-08-16 NOTE — Medication Information (Signed)
Summary: ccr/  Anticoagulant Therapy  Managed by: Weston Brass, PharmD Referring MD: Ladona Ridgel, MD PCP: Erma Heritage MD: Eden Emms MD, Theron Arista Indication 1: Atrial Fibrillation INR POC 2.2  Dietary changes: no    Health status changes: no    Bleeding/hemorrhagic complications: no    Recent/future hospitalizations: no    Any changes in medication regimen? no    Recent/future dental: no  Any missed doses?: no       Is patient compliant with meds? yes       Current Medications (verified): 1)  Coumadin 5 Mg  Tabs (Warfarin Sodium) .... As Directed 2)  Hydrochlorothiazide 25 Mg  Tabs (Hydrochlorothiazide) .... Take 1/2 Tablet By Mouth Once Daily 3)  Klor-Con 10 10 Meq  Tbcr (Potassium Chloride) .... Take 1 Tablet By Mouth Once A Day 4)  Maxair Autohaler 200 Mcg/inh Aerb (Pirbuterol Acetate) .... 2 Puffs Four Times A Day As Needed 5)  Qvar 80 Mcg/act Aers (Beclomethasone Dipropionate) .... 2 Puffs and Rinse Twice Every Day 6)  Lexapro 10 Mg Tabs (Escitalopram Oxalate) .... Take 1 By Mouth Once Daily 7)  Proair Hfa 108 (90 Base) Mcg/act Aers (Albuterol Sulfate) .... 2 Puffs Every 4-6 Hrs As Needed  Allergies: 1)  ! Warfarin Sodium (Warfarin Sodium)  Anticoagulation Management History:      The patient comes in today for her initial visit for anticoagulation therapy.  Positive risk factors for bleeding include an age of 75 years or older.  The bleeding index is 'intermediate risk'.  Positive CHADS2 values include Age > 46 years old.  Anticoagulation responsible provider: Eden Emms MD, Theron Arista.  INR POC: 2.2.  Cuvette Lot#: [id number].    Anticoagulation Management Assessment/Plan:      The patient's current anticoagulation dose is Coumadin 5 mg  tabs: as directed.  The next INR is due 09/05/2010.  Results were reviewed/authorized by Weston Brass, PharmD.  She was notified by Linward Headland, PharmD candidate.         Current Anticoagulation Instructions: INR 2.2 (INR goal: 2-3)  Take 1/2  tablet everyday except 1 tablet on Mondays, Wednesdays, and Fridays.  Recheck in 4 weeks.

## 2010-09-03 ENCOUNTER — Ambulatory Visit (INDEPENDENT_AMBULATORY_CARE_PROVIDER_SITE_OTHER)
Admission: RE | Admit: 2010-09-03 | Discharge: 2010-09-03 | Disposition: A | Discharge status: Home / Self Care | Payer: Medicare Other | Source: Ambulatory Visit | Attending: Internal Medicine | Admitting: Internal Medicine

## 2010-09-03 ENCOUNTER — Other Ambulatory Visit: Payer: Self-pay | Admitting: Internal Medicine

## 2010-09-03 ENCOUNTER — Encounter: Payer: Self-pay | Admitting: Internal Medicine

## 2010-09-03 ENCOUNTER — Ambulatory Visit (INDEPENDENT_AMBULATORY_CARE_PROVIDER_SITE_OTHER): Payer: Medicare Other | Admitting: Internal Medicine

## 2010-09-03 DIAGNOSIS — J984 Other disorders of lung: Secondary | ICD-10-CM

## 2010-09-03 DIAGNOSIS — J449 Chronic obstructive pulmonary disease, unspecified: Secondary | ICD-10-CM

## 2010-09-03 DIAGNOSIS — F17200 Nicotine dependence, unspecified, uncomplicated: Secondary | ICD-10-CM

## 2010-09-03 DIAGNOSIS — I4891 Unspecified atrial fibrillation: Secondary | ICD-10-CM

## 2010-09-05 ENCOUNTER — Encounter (INDEPENDENT_AMBULATORY_CARE_PROVIDER_SITE_OTHER): Payer: Medicare Other

## 2010-09-05 ENCOUNTER — Encounter: Payer: Self-pay | Admitting: Cardiology

## 2010-09-05 DIAGNOSIS — Z7901 Long term (current) use of anticoagulants: Secondary | ICD-10-CM

## 2010-09-05 DIAGNOSIS — I4891 Unspecified atrial fibrillation: Secondary | ICD-10-CM

## 2010-09-05 LAB — CONVERTED CEMR LAB: POC INR: 2.1

## 2010-09-11 NOTE — Medication Information (Signed)
Summary: rov/sp  Anticoagulant Therapy  Managed by: Bethena Midget, RN, BSN Referring MD: Ladona Ridgel, MD PCP: Erma Heritage MD: Shirlee Latch MD, Daxx Tiggs Indication 1: Atrial Fibrillation Lab Used: [facility] [place] Site: [address] INR POC 2.1 INR RANGE 2.0-3.0  Dietary changes: no    Health status changes: no    Bleeding/hemorrhagic complications: no    Recent/future hospitalizations: no    Any changes in medication regimen? yes       Details: Spriva added yesterday.   Recent/future dental: no  Any missed doses?: no       Is patient compliant with meds? yes       Allergies: 1)  ! Warfarin Sodium (Warfarin Sodium)  Anticoagulation Management History:      The patient is taking warfarin and comes in today for a routine follow up visit.  Positive risk factors for bleeding include an age of 75 years or older.  The bleeding index is 'intermediate risk'.  Positive CHADS2 values include Age > 96 years old.  Anticoagulation responsible provider: Shirlee Latch MD, Tadeusz Stahl.  INR POC: 2.1.  Cuvette Lot#: [id number].  Exp: 07/2011.    Anticoagulation Management Assessment/Plan:      The patient's current anticoagulation dose is Coumadin 5 mg  tabs: as directed.  The next INR is due 10/03/2010.  Anticoagulation instructions were given to patient.  Results were reviewed/authorized by Bethena Midget, RN, BSN.  She was notified by Bethena Midget, RN, BSN.         Prior Anticoagulation Instructions: INR 2.2 (INR goal: 2-3)  Take 1/2 tablet everyday except 1 tablet on Mondays, Wednesdays, and Fridays.  Recheck in 4 weeks.    Current Anticoagulation Instructions: INR 2.1 Continue 2.5mg s daily except 5mg s on Mondays, Wednesdays and Fridays. Recheck in 4 weeks.

## 2010-09-11 NOTE — Assessment & Plan Note (Signed)
Summary: 6 month /apc   Primary Provider/Referring Provider:  Dr Cam Hai  CC:  6 month follow up visit-COPD; SOB with rest and activity as well.Marland Kitchen  History of Present Illness:  March 05, 2010- - COPD, CAD, tobacco Insurance wouldn't cover Xopenex, so we folowed their lead and switched to Maxair, used about twice daily.. She says this is too expensive. She had ablation then a pacemaker, so the Xopenex issue should no longer be important. Uses Qvar every evening. In last 2 months has had productive cough with clear mucus, but denies wheeze. She feels DOE is gradually worse, but is comfortable at rest or sleep. She continues to smoke 1 PPD despie all our efforts over the years. i explained this as a major reason for productive cough and worsening dyspnea.  April 02, 2010--Presents for an acute office visit. Complains of sore throat, fatigue, increased SOB,  prod cough, wheezing, chills/sweats x5days. Has had low grade fever.>>tx w/  augmentin/steroid taper.   April 09, 2010---Presents for 1 week follow up - still prod cough, congestion.  finished abx and pred taper yesterday She feels better but not totally gone. Feels cough is just lingering on, wearing her down. Wheezing and dsypnea are less but not totally gone. Mucus is clearing. Denies chest pain,  orthopnea, hemoptysis, fever, n/v/d, edema, headache.  September 03, 2010- - COPD, CAD, tobacco, hx AFib/ablation Nurse-CC: 6 month follow up visit-COPD; SOB with rest and activity as well. She sees little change in chronic dyspnea, espcially with exertion. She has reduced to 1/2 pack cigs/ day, but isn't sure she can get lower. Noting herself wheeze more. Uses Proair 1-2x/ day .  Denies cough, phlegm, wheeze. Occasionally aware resting tremor left hand.      Preventive Screening-Counseling & Management  Alcohol-Tobacco     Smoking Status: current     Smoking Cessation Counseling: yes     Packs/Day: 0.75     Year Started:  1959     Tobacco Counseling: to quit use of tobacco products  Current Medications (verified): 1)  Coumadin 5 Mg  Tabs (Warfarin Sodium) .... As Directed 2)  Hydrochlorothiazide 25 Mg  Tabs (Hydrochlorothiazide) .... Take 1/2 Tablet By Mouth Once Daily 3)  Klor-Con 10 10 Meq  Tbcr (Potassium Chloride) .... Take 1 Tablet By Mouth Once A Day 4)  Lexapro 10 Mg Tabs (Escitalopram Oxalate) .... Take 1 By Mouth Once Daily 5)  Proair Hfa 108 (90 Base) Mcg/act Aers (Albuterol Sulfate) .... 2 Puffs Every 4-6 Hrs As Needed  Allergies (verified): 1)  ! 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  The patient denies shortness of breath at rest, productive cough, non-productive cough, coughing up blood, chest pain, irregular heartbeats, acid heartburn, indigestion, loss of appetite, weight  change, abdominal pain, difficulty swallowing, sore throat, tooth/dental problems, headaches, nasal congestion/difficulty breathing through nose, and sneezing.    Vital Signs:  Patient profile:   77 year old female Height:      64.5 inches Weight:      168.38 pounds BMI:     28.56 O2  Sat:      98 % on Room air Pulse rate:   84 / minute BP sitting:   116 / 78  (left arm) Cuff size:   regular  Vitals Entered By: Reynaldo Minium CMA (September 03, 2010 1:45 PM)  O2 Flow:  Room air CC: 6 month follow up visit-COPD; SOB with rest and activity as well.   Physical Exam  Additional Exam:  General: A/Ox3; pleasant and cooperative, NAD,  SKIN: persistent small vasular lesions or avms on face- mala. NODES: no lymphadenopathy HEENT: [place]/AT, EOM- WNL, Conjuctivae- clear, PERRLA, TM-WNL, Nose- clear, Throat- clear and wnl, Mallampati II, dentures NECK: Supple w/ fair ROM, JVD- none, normal carotid impulses w/o bruits Thyroid- CHEST: coarse BS with mild wheeze right lung.  HEART: RRR, no m/g/r heard ABDOMEN: Soft and nl;  BJY:NWGN, nl pulses, no edema  NEURO: Grossly intact to observation, I don't see tremor right now.       Impression & Recommendations:  Problem # 1:  COPD (ICD-496) Given her awareness of mild wheeze and hx of AFib, I would like to try again with Spiriva. Her incidental tremor is not progressive and I suggested she report it to her PCP.   Problem # 2:  TOBACCO ABUSE (ICD-305.1)  We again addressed efforts and support for smoking cessation.   Problem # 3:  PULMONARY NODULE (ICD-518.89)  No nodule seen on CXR a year ago, but with ongong smoking and COPD we will update.   Orders: Est. Patient Level IV (99214) T-2 View CXR (71020TC)  Medications Added to Medication List This Visit: 1)  Spiriva Handihaler 18 Mcg Caps (Tiotropium bromide monohydrate) .Marland Kitchen.. 1  daily  Patient Instructions: 1)  Please schedule a follow-up appointment in 6 months. Please call sooner if needed.  2)  Script sample- restart Spiriva, once daily 3)  A chest x-ray has been recommended.  Your imaging study may require preauthorization.  4)  Tell your primary doctor about your tremor Prescriptions: SPIRIVA HANDIHALER 18 MCG CAPS (TIOTROPIUM BROMIDE MONOHYDRATE) 1  daily  #30 x  prn   Entered and Authorized by:   Waymon Budge MD   Signed by:   Waymon Budge MD on 09/03/2010   Method used:   Print then Give to Patient   RxID:   [id number]

## 2010-09-24 ENCOUNTER — Encounter: Payer: Self-pay | Admitting: Internal Medicine

## 2010-10-03 ENCOUNTER — Ambulatory Visit (INDEPENDENT_AMBULATORY_CARE_PROVIDER_SITE_OTHER): Payer: Medicare Other | Admitting: *Deleted

## 2010-10-03 DIAGNOSIS — I4891 Unspecified atrial fibrillation: Secondary | ICD-10-CM

## 2010-10-03 LAB — POCT INR: INR: 2.1

## 2010-10-03 NOTE — Patient Instructions (Signed)
INR 2.1 Cont with current regimen Return to clinic in 4 weeks 

## 2010-10-05 ENCOUNTER — Encounter: Payer: Self-pay | Admitting: Internal Medicine

## 2010-10-15 ENCOUNTER — Encounter: Payer: Self-pay | Admitting: Internal Medicine

## 2010-10-31 ENCOUNTER — Encounter: Payer: Medicare Other | Admitting: *Deleted

## 2010-11-07 ENCOUNTER — Ambulatory Visit (INDEPENDENT_AMBULATORY_CARE_PROVIDER_SITE_OTHER): Payer: Medicare Other | Admitting: *Deleted

## 2010-11-07 DIAGNOSIS — I4891 Unspecified atrial fibrillation: Secondary | ICD-10-CM

## 2010-11-07 LAB — POCT INR: INR: 2.7

## 2010-11-27 NOTE — Assessment & Plan Note (Signed)
[facility]                             PULMONARY OFFICE NOTE   NAME:[name], [name]                    MRN:          [id number]  DATE:03/17/2007                            DOB:          Dec 08, 1930    PROBLEMS:  1. Chronic obstructive pulmonary disease.  2. History of lung nodule.  3. Congestive heart failure.  4. Paroxysmal atrial fibrillation.  5. Tobacco abuse.   HISTORY:  She is down to smoking about a half a pack a day.  She again  refers to some persistent cough with white sputum as a summer cold  going on now for 6 weeks with no chest pain, fever, blood, or  palpitation.  No ankle edema.  Chest CT on Nov 20, 2005, had shown no  change in small lung nodules back to 2004, consistent with being benign.  We agreed to get a chest x-ray to follow this up.  We spent time talking  about tobacco cessation, support methods and medical concerns.  I  answered her questions.   OBJECTIVE:  Weight 172 pounds, BP 132/70, pulse 75, room air saturation  97%.  Loose cough, odor of tobacco.  Regular heart sounds without  murmur.  I do not find adenopathy or edema.   IMPRESSION:  1. History of lung nodules.  2. Tobacco abuse.  3. Chronic obstructive pulmonary disease with chronic bronchitis      exacerbation.   PLAN:  1. Smoking cessation as above.  2. Chest x-ray.  3. Nebulizer treatment here, Xopenex 1.25 mg, Depo-Medrol 80 mg IM.  4. Doxycycline for 7 days.   Schedule return 6 months but earlier p.r.n.     Clinton D. Maple Hudson, MD, Tonny Bollman, FACP  Electronically Signed    CDY/MedQ  DD: 03/17/2007  DT: 03/18/2007  Job #: [id number]   cc:   Donia Guiles, M.D.  Francisca December, M.D.

## 2010-11-30 NOTE — Discharge Summary (Signed)
[name]             ACCOUNT NO.:  [id number]   MEDICAL RECORD NO.:  [id number]          PATIENT TYPE:  INP   LOCATION:  3713                         FACILITY:  [facility]   PHYSICIAN:  Francisca December, M.D.  DATE OF BIRTH:  Nov 03, 1930   DATE OF ADMISSION:  04/06/2005  DATE OF DISCHARGE:  04/10/2005                                 DISCHARGE SUMMARY   DISCHARGE DIAGNOSES:  1.  Paroxysm atrial fibrillation, currently sinus rhythm.  2.  Chronic obstructive pulmonary disease, recent exacerbation.  3.  Hypertension.  4.  Tobacco abuse.  5.  History of depression.  6.  History of Bell's palsy.   HISTORY OF PRESENT ILLNESS:  Please see admission history and physical  examination. [name] is a 77 year old woman with a history of  tobacco abuse who was admitted with atrial fibrillation and uncontrolled  ventricular response as well as marked dyspnea. Chest x-ray and clinical  examination indicated the likelihood of COPD exacerbation.   Her chest x-ray showed mild edema and changes of COPD. Admission laboratory  showed a white blood cell count of 7900, hemoglobin of 14.5, hematocrit  43.1. BNP was 361. CK-MB and troponin were negative x2. Serum electrolytes  were normal. BUN was 14, creatinine 1.3, glucose 167 on admission. Liver  associated enzymes normal. Arterial blood gas showed a pH of 7.42, pCO2 of  45, pO2 of 73. O2 saturation of 85%. The amiodarone level was 0.8. BNP  subsequently fell to 19 during hospitalization.   HOSPITAL COURSE:  The patient was admitted to telemetry and placed on IV  Cardizem, given Xopenex nebulization, and started subcutaneous Lovenox. She  was continued on her home amiodarone. Benicar and HCTZ were held. She  converted to sinus rhythm spontaneously within 24 hours. She slowly  responded to Xopenex nebulization, p.o. prednisone, and Zithromax  antibiotic. She was seen by Dr. Tresa Endo and Dr. Jomarie Longs from the hospitalist  service.   On  hospital day two, she was started on Warfarin and on the day of discharge  her INR has risen to 2.8.   A 2-D echocardiogram during hospitalization showed normal left ventricular  systolic function, no significant valvular abnormality, mild left atrial  enlargement and no LVH. Chest x-ray showed resolution of edema and evidence  of COPD at discharge.   DISCHARGE MEDICATIONS:  1.  Amiodarone 300 mg p.o. daily.  2.  Warfarin 2.5 alternating with 5 mg daily with 2.5 mg on the day of      discharge.  3.  Lexapro 10 mg p.o. daily.  4.  Spiriva 18 mcg daily.  5.  Nicotine patch 14 mg per 24 hours daily.  6.  Zithromax 250 mg daily for two more doses.  7.  HCTZ 25 mg p.o. daily.  8.  K-Dur 20 mEq p.o. daily.  9.  Prednisone decreasing dosages per internal medicine recommendation.   The patient was counseled regarding tobacco cessation during the  hospitalization and as above placed on nicotine patch. She was also seen by  the tobacco cessation counselor. She will also be discharged on home O2 at  least for  temporary purposes at two liters per minute.   DISPOSITION:  To home.   CONDITION ON DISCHARGE:  Improved.   FOLLOW UP:  The patient is to see her primary care physician, Dr. Arvilla Market,  on May 01, 2005 at 1:45 p.m. She is to follow up with me on April 25, 2005 at 10:20 in the morning. She will also be seen in our office for PT/INR  check on April 12, 2005.      Francisca December, M.D.  Electronically Signed     JHE/MEDQ  D:  04/10/2005  T:  04/11/2005  Job:  [id number]   cc:   Donia Guiles, M.D.  Fax: [phone]

## 2010-11-30 NOTE — Discharge Summary (Signed)
[facility]  Patient:    [name], [name]                    MRN: [id number] Adm. Date:  [id number] Disc. Date: [id number] Attending:  Anastasio Auerbach CC:         Desma Maxim, M.D.             Francisca December, M.D.                           Discharge Summary  DATE OF BIRTH:  05-18-2031  DISCHARGE DIAGNOSES: 1. Left lower lobe pneumonia.     a. Hypoxia (room air ABG 7.43/36/56). 2.  Hypotension, resolved. 3.  Dehydration. 4.  Chronic obstructive pulmonary disease.     a. Baseline dyspnea on exertion. 5.  History of atrial fibrillation.     a. Normal sinus rhythm at this time.     b. Cardioversion three years ago, on amiodarone now.     c. History of congestive heart failure. 6.  Chronic hypertension. 7.  Bells palsy one year ago.     a. Negative MRI (August 2001).     b. Still left face droop. 8.  Dermatofibroma sarcoma.     a. Removed in 1995 and 1996.  DISCHARGE MEDICATIONS: 1. (New) Levaquin 500 mg q.d. through 05/22 (total 14-day therapy). 2. (New) Humibid LA 600 mg two p.o. b.i.d. times 10 days. 3. (New) Albuterol nebulizer 2.5 mg q.i.d. 4. (New) Atrovent nebulizer 0.5 mg q.i.d. 5. (New) After 10 days of nebulizers, begin Combivent MDI with spacer 2 puffs    q.i.d. 6. (New) Robitussin AC 10 ml q.4h. p.r.n. cough. 7. Amiodarone 200 mg q.d. 8. Wellbutrin SR 150 mg b.i.d. 9. Altace 2.5 mg q.d.  CONDITION ON DISCHARGE:  Stable.  Oxygen saturation is 93% on room air. Weight 190.4.  Blood pressure 132/71 mmHg, heart rate 64, respiratory rate 20. Oxygen saturation 93% on room air.  DISPOSITION:  The patient is discharged home with [facility] to deliver nebulizers.  RECOMMENDED DIET:  As tolerated.  Drink plenty of water.  RECOMMENDED ACTIVITY:  Nonstrenuous.  FOLLOWUP:  Dr. Arvilla Market on Tuesday, May 22 at 10:30 a.m.  The patient was instructed to contact Dr. Arvilla Market is she develops trouble breathing, fevers, becomes unable to  take the medications, or has other problems or questions.  CONSULTANTS:  None.  PROCEDURES PERFORMED: 1. Chest x-ray (May 8):  Left lower lobe infiltrates. 2. Chest x-ray (May 10):  No change.  No pleural effusion.  HOSPITAL COURSE: #1 - LEFT LOWER LOBE PNEUMONIA:  [name] is a 77 year old Caucasian female with chronic obstructive pulmonary disease who presented with a 3-week history of nonproductive cough and a 2-week history of progressive shortness of breath.  Over the previous two days she developed fever.  She denied chest pain or nausea and vomiting.  Appetite poor.  She had smoked for about 45 years, but quit in March of this year.  She has a history of pneumonia requiring intubation approximately 10 years ago and would want that intervention done again if needed.  On presentation, she was hypotensive with a blood pressure of 93/55 mmHg.  Her respiratory rate was up at 24 and she was saturating at 94% on room air according to the pulse oximeter.  Room air ABG demonstrated a pH of 7.43, pCO2 of 36, and a pO2 of 56.  Her white count on admission  was 12,300 with an absolute neutrophil count of 9,900.  She was diagnosed with left lower lobe community-acquired pneumonia by exam and x-ray as well as clinical presentation.  The hypotension was treated with IV fluids and resolved.  She was placed on 2 liters of oxygen as well as antibiotics, nebulizers, and expectorant.  She had no significant wheezing on presentation nor throughout the hospital stay and, therefore, I did not opt to put her on steroids.  She responded to therapy and was saturating well on room air prior to discharge.  Her symptoms had markedly improved and she will go home to complete a total of 14 days of therapy including nebulizers.  She will switch to metered dose inhalers after 10 days.  Her chest x-ray should be followed to clearing.  DISCHARGE LABORATORY DATA:  Hemoglobin 12.3, WBCs 7,400, platelet  count 220,000.  Sodium 143, potassium 4.0, chloride 111, bicarbonate 27, BUN 7, creatinine 0.9, glucose 92.  Of note, 1 of 2 blood cultures grew coag-negative Staph which I believe is a contaminant. DD:  01/03/00 TD:  01/04/00 Job: [id number]

## 2010-11-30 NOTE — Assessment & Plan Note (Signed)
[facility]                             PULMONARY OFFICE NOTE   NAME:HOFFMANArmetta, [name]                    MRN:          [id number]  DATE:09/21/2006                            DOB:          1931/03/13    PROBLEM:  1. Chronic obstructive pulmonary disease.  2. History of lung nodule.  3. Congestive heart failure.  4. Paroxysmal atrial fibrillation.  5. Tobacco abuse.   HISTORY:  She has had a pacemaker and AV ablation but still in and out  of atrial fibrillation.  She had a cold and with that had productive  cough, clear mucus, scratchy throat, no real fever.  It lasted about a  week.  She still smokes about a half a pack per day and we discussed  this again.  She stopped Spiriva and is not sure why.  She would like to  have a breathing treatment today and indicates she does feel somewhat  congested.  Pulmonary function testing in November 2006 had shown severe  obstructive airways disease with an FEV-1 53% of predicted and limited  response to bronchodilator.  Chest CT in May 2007 showed no change in  scattered small pulmonary nodules going back to 2004 and interpreted by  the radiologist as compatible with benign nodules.  This was discussed  with her especially in the context of her continued smoking.  She has  had no chest pain or blood.   MEDICATION:  Potassium, Coumadin, Combivent rescue inhaler usually used  t.i.d.  She has also had Xopenex rescue inhaler.   ALLERGIES:  NO MEDICATION ALLERGY.   OTHER MEDICINES:  Most of her other medicines have been discontinued.   OBJECTIVE:  Weight 174 pounds, BP was not recorded, pulse regular 78,  room air saturation 95%.  She looks comfortable.  Breath sounds are  quite diminished.  No accessory muscle use.  There is minimal congestion  in the bases but overall pretty quiet.  Heart sounds are regular.  I do  not hear a murmur.  There is no peripheral edema.  No adenopathy.   IMPRESSION:  1.  Chronic obstructive pulmonary disease with mild bronchitic      exacerbation following what sounds like a viral respiratory illness      a couple weeks ago.  2. Ongoing tobacco abuse.   PLAN:  1. We refilled Xopenex as her primary rescue inhaler with discussion.  2. Neb treatment here today Xopenex 0.63.  Depo-Medrol 80 mg IM with      steroid talk.  3. Schedule return in 6 months.  Will need a chest x-ray on return.     Lisa D. Maple Hudson, MD, Tonny Bollman, FACP  Electronically Signed    CDY/MedQ  DD: 09/21/2006  DT: 09/21/2006  Job #: [id number]   cc:   Donia Guiles, M.D.  Francisca December, M.D.

## 2010-11-30 NOTE — H&P (Signed)
NAMEKEVIONNA, HEFFLER NO.:  [id number]   MEDICAL RECORD NO.:  [id number]          PATIENT TYPE:  INP   LOCATION:  1824                         FACILITY:  [facility]   PHYSICIAN:  Francisca December, M.D.  DATE OF BIRTH:  22-Nov-1930   DATE OF ADMISSION:  06/03/2006  DATE OF DISCHARGE:                              HISTORY & PHYSICAL   PRIMARY CARE PHYSICIAN:  Donia Guiles, MD, at [facility] at  [place].   CARDIOLOGIST:  Corliss Marcus, MD   CHIEF COMPLAINT:  Palpitations with an elevated heart rate.   HISTORY OF PRESENT ILLNESS:  Ms. Wolz is a 77 year old Caucasian  female with a history of paroxysmal atrial fibrillation, maintaining  normal sinus rhythm on Rythmol SR 325 mg twice daily, systemic  anticoagulation with Coumadin, and hypertension.  She presented to the  office today complaining of a sudden onset of palpitations on yesterday  morning.  She used her blood pressure cuff to check her heart rate and  obtained a reading between the 120s and the 130s.  Associated with the  palpitations are shortness of breath, dizziness (lightheadedness),  nausea without vomiting and fatigue.  On yesterday afternoon, her heart  rate slowed into the 70s; however, she had a recurrence at approximately  5 p.m. which lasted until the presented to the office today.  She says  she feels like I've hit by a truck.  On yesterday, she admits to being  compliant with her medications; however, today she denies taking any  medications because she was afraid to do so.  Upon presentation to the  office, a 12-lead EKG was obtained and revealed atrial flutter with  __________  A-V block with a ventricular rate of 138 beats per minute.  There were no ischemic changes noted on the EKG.  Ms. Riggi's  breathing was labored; however, O2 saturation on room air was 99%.  The  patient denies chest pain, however, admits to chest tightness.   PAST MEDICAL HISTORY:  1. Paroxysmal atrial  fibrillation.  2. Hypertension.  3. Normal LV systolic function via 2 D echocardiogram September 2006,      with an EF of 55-65%.  4. Tobacco abuse.  5. History of COPD.  6. Depression.   ALLERGIES:  No known drug allergies.   CURRENT MEDICATIONS:  1. Lexapro 10 mg daily.  2. Spiriva 18 mcg 1 puff daily.  3. HCTZ 25 mg daily.  4. Coumadin take as directed.  5. Potassium chloride mEq daily.  6. Rythmol SR 325 mg twice daily.  7. Combivent inhaler 1-2 puffs q.i.d. p.r.n.   FAMILY HISTORY:  1. Father deceased age 28, cerebral hemorrhage.  2. Mother deceased age 60, CHF, Alzheimer's.  3. Sister deceased age 61, aggressive liver cancer.   SOCIAL HISTORY:  Widowed with 1 adult daughter who is healthy.  She  lives independently in [place] and is a retired Technical sales engineer from [employer].  She has been smoking for 50 years and is now down to 1/2 pack-per-  day.  She admits to occasional alcohol, however, denies illicit drug  use.   REVIEW  OF SYSTEMS:  All other systems reviewed are negative other than  what is stated in the HPI.   PHYSICAL EXAMINATION:  GENERAL:  A 77 year old, ill-appearing female,  pleasant and cooperative, appearing in mild distress.  VITAL SIGNS:  Blood pressure 96/70, heart rate 100 beats per minute,  weight 182, height 5 feet 4-1/2 inches.  HEENT:  Unremarkable.  NECK:  Supple without JVD or bilateral carotid bruits.  Carotid  upstrokes 2+.  PULMONARY:  Diffuse expiratory wheezing.  No use of accessory muscles.  CV:  Irregular rate, irregular rhythm.  Normal S1 and S2 without  murmurs, gallops, clicks, or rubs.  ABDOMEN:  Soft, nontender, nondistended with active bowel sounds.  No  masses, organomegaly, or bilateral bruits.  EXTREMITIES:  No peripheral edema.  DP and PT pulses 2+/2 bilaterally.  SKIN:  Warm and dry without rashes or lesions.  NEUROLOGIC:  No focal motor or sensory deficits.  PSYCH:  Normal mood and affect.  BACK:  No kyphosis or  scoliosis.   LABORATORY DATA:  1. EKG, June 03, 2006, atrial flutter with variable A-V block with      a ventricular rate of 138 beats per minute.  No evidence of      ischemia.  2. 2 D echocardiogram, April 08, 2005.  Normal LV systolic      function with an EF of 55-65%.  No regional wall motion      abnormalities.  Mitral valve area by pressure half-time was 2.93 sq      cm.  Left atrial size was at the upper limits of normal.   ASSESSMENT:  1. Atrial flutter.  2. Dyspnea.  3. Chest tightness.   PLAN:  1. Admit to cardiac telemetry unit under the service of Dr. Corliss Marcus with a diagnosis of atrial flutter.  2. Cardiac panel including troponin I q.8h. x3 with the first set to      be drawn now.  3. Obtain BMET, PT, INR, PTT, CBC, TSH, BNP, magnesium, portable chest      x-ray, and EKG upon admission.  4. Obtain 2 D echocardiogram for a diagnosis of atrial flutter once      heart rate is less than 100 beats per minute.  5. Start IV Cardizem 10 mg bolus with a 5 mg/hr drip, titrating to      maintain heart rate less than 110 beats per minute at 5-20 mg/hr.  6. Continue home medications, keeping systolic blood pressure greater      than 90 mmHg.  7. Increase Rythmol SR dosage to 425 mg twice daily.  8. Initiate cardiology p.r.n. orders.  9. Daily BMET and CBC.  10.Obtain a smoking cessation consult.  11.Coumadin per pharmacy dosing with daily PT/INR checks.   Dr. Corliss Marcus was the supervising physician present in the office  today during the patient's evaluation.  The patient was seen,  interviewed, and examined by Dr. Amil Amen, who participated in the  medical decision making and plan of care.      Tylene Fantasia, [place]      Francisca December, M.D.  Electronically Signed    RDM/MEDQ  D:  06/03/2006  T:  06/03/2006  Job:  [id number]   cc:   Donia Guiles, M.D.

## 2010-11-30 NOTE — Discharge Summary (Signed)
[name], [name]             ACCOUNT NO.:  [id number]   MEDICAL RECORD NO.:  [id number]          PATIENT TYPE:  INP   LOCATION:  4738                         FACILITY:  [facility]   PHYSICIAN:  Francisca December, M.D.  DATE OF BIRTH:  07-Feb-1931   DATE OF ADMISSION:  06/03/2006  DATE OF DISCHARGE:  06/05/2006                               DISCHARGE SUMMARY   ADMISSION DIAGNOSIS:  Atrial flutter.   DISCHARGE DIAGNOSES:  1. Atrial flutter, status post intravenous digoxin and oral Rythmol SR      425 mg twice daily with spontaneous conversion to normal sinus      rhythm.  2. Hypertension, controlled.  3. History of paroxysmal atrial fibrillation.  4. Normal left ventricular systolic function via 2-D echocardiogram      September 2006 with an ejection fraction of 55-65%.  5. Tobacco abuse with recommended cessation  6. Chronic obstructive pulmonary disease.  7. Depression.   PROCEDURE:  A 2-D echocardiogram done on June 04, 2006:  LV systolic  function was at the lower limits of normal with an EF of 50%.  There was  no diagnostic evidence of LV regional wall motion abnormalities.  LV  wall thickness was mildly to moderately increased.   HOSPITAL COURSE:  [name] is a 77 year old female with a known  history of atrial fibrillation, maintaining normal sinus rhythm on oral  Rythmol SR 325 mg twice daily and Coumadin as directed.  She was  admitted to the [facility] on June 03, 2006 with a  diagnosis of atrial flutter.  She was started on IV Cardizem bolus with  the plan to continue with IV Cardizem drip.  However, she received a  local reaction to the IV Cardizem with rash, so the IV Cardizem was  discontinued.  She was given IV Solu-Medrol and was then started on IV  digoxin 0.25 mg.  Upon admission, [name] was hypotensive, so she  was given 0.45% normal saline bolus of 250 mL and her blood pressure was  rechecked.  Her IV Lopressor was held for systolic  blood pressure of  less than 90, and she was then given another 1/2 saline bolus of 250 mL  of sodium chloride.  Serial cardiac enzymes were obtained and were  negative with a peak troponin of 0.02.  Chest x-ray was negative for  acute findings.  BNP was elevated at 663 secondary to atrial flutter  however was decreased prior to admission to 209.  Later on the evening  of admission, the patient converted to normal sinus rhythm.  A 12-lead  EKG obtained prior to discharge revealed normal sinus rhythm with a  ventricular rate of 60 beats per minute without evidence of ischemia.  During the admission, the patient's Rythmol SR was increased to 425 mg  twice daily.  On June 05, 2006 the patient was discharged to home in  stable condition without any further complaint of palpitations, fatigue,  shortness of breath, dizziness (lightheadedness).  She was discharged to  home on the increased dosage of Rythmol SR, and her hydrochlorothiazide  was cut down to one-half  tablet daily secondary to hypotension.   LABORATORY DATA:  Serial cardiac enzymes:  CK total 55, 47, 91, and 92  respectively.  CK-MB 1.3, 1.3, 3.2, and 3.7 respectively.  Troponin I  0.01 x3 and 0.02 respectively.  BNP 663 and 209.  Sodium 142, potassium  4.1, chloride 106, CO2 27, glucose 81 BUN 17, creatinine 1.1.  PT and  INR upon discharge 21.8 and 1.8 respectively.  TSH 1.159.  White blood  count 9.8, hemoglobin 16.4, hematocrit 48.6, platelets 251.   X-RAYS:  COPD.  No acute abnormality.   EKG:  As stated in the hospital course.   CONDITION UPON DISCHARGE:  As stated in the hospital course.   DISCHARGE MEDICATIONS:  1. Rythmol SR 425 mg twice daily.  This represents an increase in      dosage.  2. Coumadin 5 mg take as directed.  3. Hydrochlorothiazide 25 mg 1/2 tablet daily.  This represents a      decrease in dosage.  4. K-Dur 10 mEq daily.  5. Lexapro 10 mg daily.  6. Spiriva Inhaler 1 puff daily.  7. Combivent  inhaler as needed.   DISCHARGE INSTRUCTIONS:  Low salt diet.   FOLLOWUP APPOINTMENTS:  The patient is scheduled to call [facility] at [phone] to schedule a followup appointment with Dr. Corliss Marcus for 1 week.      Tylene Fantasia, [place]      Francisca December, M.D.  Electronically Signed    RDM/MEDQ  D:  06/06/2006  T:  06/06/2006  Job:  [id number]   cc:   Donia Guiles, M.D.

## 2010-11-30 NOTE — H&P (Signed)
[name]             ACCOUNT NO.:  [id number]   MEDICAL RECORD NO.:  [id number]          PATIENT TYPE:  INP   LOCATION:  2904                         FACILITY:  [facility]   PHYSICIAN:  Andres Shad. Rudean Curt, MD     DATE OF BIRTH:  [birth year]   DATE OF ADMISSION:  06/15/2006  DATE OF DISCHARGE:                              HISTORY & PHYSICAL   CHIEF COMPLAINT:  Weakness and shortness of breath.   HISTORY OF PRESENT ILLNESS:  [name] is a 77 year old woman with a  past medical history of COPD, asthma, CHF, atrial fibrillation, and  hypertension.  She presents today with the Chief Complaint of weakness  and shortness of breath worsening over the last 24 hours.  She was an  inpatient at [facility] from November 20-23, 2007, with  Admission Diagnosis of atrial flutter. She was on the cardiology service  at that time and, during that admission, spontaneously converted to  normal sinus rhythm.  She was managed medically by the cardiology  service and had an otherwise uncomplicated hospital course.  However,  she states that she has felt quite weak for the last 2 weeks since her  discharge, and she has had worsening constitutional symptoms and dyspnea  over the last 24-48 hours.   PAST MEDICAL HISTORY:  As mentioned above, the patient has had numerous  admissions for pneumonia in the past.   SOCIAL HISTORY:  The patient is divorced.  She smokes cigarettes.   HOME MEDICATIONS:  1. Rythmol SR 425 mg twice daily.  2. Coumadin 5 mg daily.  3. Hydrochlorothiazide 12.5 mg daily.  4. K-Dur 10 mEq daily.  5. Lexapro 10 mg daily.  6. Spiriva 1 puff daily.  7. Combivent inhaler as needed.   ALLERGIES:  DILTIAZEM to which the patient a local reaction during  infusion.   EMERGENCY DEPARTMENT COURSE:  On initial evaluation, the patient was  markedly dyspneic, had decreased breath sounds and wheezes throughout  her lung fields, and had markedly labored breathing.  Vital signs  showed  blood pressure of 179/70, pulse 62, respiratory rate 30, oxygen  saturation 85% on room air, 99% on 12 liters by face mask.  She was  given 80 mg of Lasix in the emergency department, and several hours  later on my initial evaluation, the patient had significant subjective  improvement.  A chest x-ray was performed which was significant for a  standard lung volume suggestive of COPD, cardiomegaly, and increased  vascular markings as compared with the previous study on 20 November.   PHYSICAL EXAMINATION:  GENERAL APPEARANCE:  The patient was lying on the  bed in the emergency department, alert, oriented, and appropriate.  She  had mildly labored breathing but was able to speak full sentences. She  was wearing a nonrebreather face mask.  HEENT:  Mucous membranes were dry.  NECK:  There was no jugular venous distention.  CHEST: Air movement was diminished throughout.  The patient had very  prolonged expiration and expiratory wheezes.  CARDIOVASCULAR:  Heart sounds were faint.  She was tachycardic and had  normal S1  and S2.  ABDOMEN: Normal bowel sounds.  Soft, nontender, nondistended.  No  organomegaly.  EXTREMITIES: The patient had chronic skin changes on her lower  extremities.  Pulses were palpable, and she had no edema.   LABORATORY STUDIES:  Sodium 138, potassium 0.5, chloride 104,  bicarbonate 29, BUN 11, creatinine not available, glucose 125.  Hemoglobin 17, hematocrit 50.  Venous pH 7.34, venous pCO2 54.   Electrocardiogram: The patient varied between sinus rhythm with a rate  of approximately 60 and atrial fibrillation or atrial flutter with a  rate between 100 and 114.   CK-MB was less than 1.0.  Troponin was less than 0.05. Urinalysis showed  moderate hemoglobin, moderate leukocyte esterase, positive nitrite, 7-10  white blood cells, many bacteria.   ASSESSMENT AND PLAN:  [name] is a 77 year old being admitted to the  hospital with hypoxemia and dyspnea.  It  is unclear if this is primarily  due to chronic obstructive pulmonary disease or pulmonary edema.  She  does seem to have increased vascular markings on her chest x-ray;  however, she did not have rales on her lung exam, and her dyspnea is out  of proportion to the radiographic findings.  She also did not have  jugular venous distention or peripheral edema.  She has, however,  responded quite well to diuresis suggesting that she may have  bronchospasm that was induced by some fluid retention.  An  echocardiogram had been done during her previous admission showing an  ejection fraction of approximately 50%. She does, however, have a  history of hypertension, so she may have some diastolic dysfunction.   PLAN:  1. Congestive heart failure:  The patient will receive another dose of      furosemide tonight. She will also be started on lisinopril.  A BNP      will be checked to help differentiate whether this is truly      congestive heart failure or predominantly a COPD exacerbation.  Her      intake and urine output will be closely monitored during this      hospitalization.  2. Atrial fibrillation and atrial flutter:  I have discussed the case      with Dr. Mayford Knife from [facility], who is covering for Dr.      Amil Amen.  Based on her recommendation, we will hold the Rythmol      during this hospitalization or until further instructed by      cardiology.  The patient continues to alternate between atrial      fibrillation/atrial flutter and sinus rhythm.  She will be admitted      to a monitored floor, and her extended electrolytes including      magnesium will be monitored.  3. Chronic obstructive pulmonary disease:  The patient will be given      Xopenex nebulizers and treated with steroids, and this will be      followed clinically.  4. Urinary tract infection: The patient will be started on ceftriaxone     to treat both likely urinary pathogens as well as any pathogens      that  could be contributing to the COPD exacerbation.  Her urine has      been sent to the lab for culture, and antibiotics will be either      discontinued or modified based on the results of the urine culture.  5. Hypertension:  The patient remain on hydrochlorothiazide.  I am  also starting lisinopril mainly because of the      concern for heart failure but also to help control her blood      pressure which was elevated upon her arrival in the emergency      department.  6. Depression: The patient will continue Lexapro.      Andres Shad. Rudean Curt, MD  Electronically Signed     PML/MEDQ  D:  06/15/2006  T:  06/15/2006  Job:  [id number]   cc:   Donia Guiles, M.D.  Francisca December, M.D.

## 2010-11-30 NOTE — Consult Note (Signed)
NAMEJESSIA, KIEF             ACCOUNT NO.:  [id number]   MEDICAL RECORD NO.:  [id number]          PATIENT TYPE:  INP   LOCATION:  2904                         FACILITY:  [facility]   PHYSICIAN:  Doylene Canning. Ladona Ridgel, MD    DATE OF BIRTH:  13-Nov-1930   DATE OF CONSULTATION:  06/15/2006  DATE OF DISCHARGE:                                 CONSULTATION   CONSULTATION REQUESTED BY:  Dr. Armanda Magic   INDICATION FOR CONSULTATION:  Evaluation of patient with hypotension and  atrial fibrillation with a rapid ventricular response.   HISTORY OF PRESENT ILLNESS:  The patient is a 77 year old woman with  longstanding COPD and tobacco abuse with a history of  paroxysmal/persistent atrial fibrillation for many years.  She had been  on multiple medications including amiodarone and most recently high-dose  Rythmol.  She presented to the hospital this time after having prolonged  palpitations for several days and was found to be in atrial fibrillation  with a rapid ventricular response with intermittent wide QRS tachycardia  which most likely represents rate-related aberration.  The patient has  not had syncope, but she does admit to some dizziness.  I note review of  her EKGs and telemetry strips from past admissions demonstrated  symptomatic tachybrady with positive over 3 seconds at times and atrial  fibrillation with a rapid ventricular response at other times.  The  patient has denied chest pain except when she feels her heart racing  really fast but not at other times.  She has had no syncope.  She does  occasionally have dizzy spells.  The patient has a cough which has  really been mostly nonproductive in terms of sputum production.   PAST MEDICAL HISTORY:  1. Hypertension.  2. COPD, for which she is followed by our pulmonologist at [facility].  3. She has a history depression.  4. History of Bell's palsy.  5. History of mild-to-moderate obesity.   MEDICATIONS:  1. Rythmol SR 425 twice  daily.  2. Coumadin/HCTZ 25 mg 1/2 tab daily.  3. Potassium supplementation.  4. Lexapro.  5. Spiriva and combination inhalers.   FAMILY HISTORY:  Notable for father dying of stroke at age 40 and a  mother dying of complications of heart failure and Alzheimer's dementia.  She had a sister who died of liver cancer.   SOCIAL HISTORY:  The patient is widowed.  She has one adult daughter.  She lives in [place].  She has a greater than 50 pack-year smoking  history but now smokes 1/2 pack per day.  She rarely drinks alcoholic  beverages.   REVIEW OF SYSTEMS:  Really as noted in the HPI.  One thing that she does  note is prolonged dyspnea for over several years now.  She does note  fatigue and weakness and has trouble keeping up with her grandchildren.  Her rest of review of systems was reviewed and found to be negative  except for some problems with depression.   PHYSICAL EXAM:  GENERAL:  She is a pleasant 28 woman in no acute  distress.  VITAL SIGNS:  Blood pressure  was 95/50, pulse was 120 and irregularly  irregular, respirations were 24.  Oxygen saturation was 95%.  Temperature is 98.  HEENT:  Normal.  NECK:  Revealed no jugular distension.  There is no thyromegaly.  Trachea is midline.  The carotids are 2+ and symmetric.  LUNGS:  The lung exam revealed scattered wheezes but no rales or  rhonchi.  There was a mild increased work of breathing.  CARDIOVASCULAR:  Distant with an irregularly irregular tachycardia.  There are no murmurs, rubs, or gallops appreciated.  The PMI was could  not be appreciated.  ABDOMEN:  Soft, nontender, nondistended.  There is no organomegaly.  The  bowel sounds are present.  There is no rebound or guarding.  EXTREMITIES:  Demonstrated no cyanosis, clubbing, or edema.  The pulses  were 2+ and symmetric.  NEUROLOGIC:  Alert and oriented x3 with cranial nerves intact.  The  strength was 5/5 and symmetric.   The EKG demonstrates atrial fibrillation  with a rapid ventricular  response with intermittent aberration.  I cannot definitively rule out  nonsustained VT.   IMPRESSION:  1. Atrial fibrillation with a rapid ventricular response with      intermittent wide QRS tachycardia most likely secondary to Rythmol.  2. Hypotension secondary to #1.  3. Severe chronic obstructive pulmonary disease.  4. Congestive heart failure likely diastolic and in origin, though we      need to rule out systolic dysfunction secondary to a tachycardia-      induced cardiomyopathy.  At this point, the patient has failed      multiple medical therapies for her atrial fibrillation, and I have      recommended that because beta blockers are relatively      contraindicated with her chronic obstructive pulmonary disease and      because she has had unusual reaction to Cardizem (right arm redness      and swelling, questionable allergy), that I would suggest gentle      hydration tonight for her hypotension and give her intravenous      digoxin and give low dose IV Lopressor if no evidence of worsening      wheezing.  Ultimately, I think AV node ablation and pacemaker are      warranted.  I would recommend a 2-D echocardiogram to help and      assist the treatment.      Doylene Canning. Ladona Ridgel, MD  Electronically Signed     GWT/MEDQ  D:  06/15/2006  T:  06/16/2006  Job:  [id number]   cc:   Francisca December, M.D.

## 2010-11-30 NOTE — Op Note (Signed)
[name], [name]             ACCOUNT NO.:  [id number]   MEDICAL RECORD NO.:  [id number]          PATIENT TYPE:  INP   LOCATION:  2023                         FACILITY:  [facility]   PHYSICIAN:  Doylene Canning. Ladona Ridgel, MD    DATE OF BIRTH:  [birth year]   DATE OF PROCEDURE:  06/16/2006  DATE OF DISCHARGE:                               OPERATIVE REPORT   PROCEDURE PERFORMED:  Implantation of a dual-chamber biventricular  pacemaker followed by AV node ablation.   INTRODUCTION:  The patient is a 77 year old woman with a history of  atrial fibrillation and who has failed multiple medical therapies and  despite high dose Rythmol, presented with atrial fibrillation and rapid  ventricular response, congestive heart failure and wide QRS rhythm.  It  appeared that the wide QRS rhythm was in fact A fib with aberrant  conduction secondary to the Rythmol.  Rythmol was held.  She is now  referred for AV node ablation and pacemaker implantation secondary to  all the above.   PROCEDURE:  After informed consent was obtained, the patient is taken  diagnostic EP lab in fasting state.  After usual preparation and  draping, intravenous fentanyl Midazolam was given for sedation.  30 mL  lidocaine was infiltrated in the left infraclavicular region.  A 6 cm  incision was carried out over this region.  Electrocautery utilized to  dissect down to the fascial plane.  The left subclavian vein was  punctured after 10 mL of contrast demonstrated it to be patent.  The  Medtronic model 5076 52-cm active fixation pacing lead serial number  [id number] was advanced to the right ventricle and the Medtronic model  5076 45-cm active fixation pacing lead, serial number [id number] was  advanced to the right atrium.  Mapping was sharply carried out in the  right ventricle and at the final site on the RV septum.  The R-waves  measured 8 mV and the pacing threshold was 0.7 volts at 0.5  milliseconds.  The impedance was 923 ohms.   With the ventricular lead in  satisfactory position, attention was then turned placement of the atrial  lead which was placed in the residual right atrial appendage where P-  waves measured 4 mV and the pace impedance was 531 ohms.  It should be  noted that multiple sites were attempted for pacing but the pacing  thresholds were high in all.  The final site the threshold was 2 volts  at 0.5 milliseconds.  10 volts pacing in both the atrial and ventricle  did not stimulate the diaphragm.  With both the atrial and ventricular  leads in satisfactory position, attention was then turned to placement  of left ventricular lead.  It should be noted the left ventricular lead  was placed secondary to the patient's congestive heart failure and A  fib.  The 6-French hexapolar catheter was advanced by way of the EP  guiding catheter into the coronary sinus without difficulty.  Venography  of the coronary sinus then carried out demonstrating a posterior lateral  vein which was quite a nicely located vein for LV pacing.  The Medtronic  model 4194 88-cm bipolar lead was advanced into the posterolateral vein.  The lead serial number was [id number].  At the final site the pacing  threshold was 2.5 volts at 0.5 milliseconds and no diaphragmatic  stimulation was seen.  It should be noted that the pacing threshold in  the unipolar mode was 2 volts at 0.5.  With these satisfactory  parameters, the lead was secured to the subpectoralis fascia with a  figure-of-eight silk suture.  Sewing sleeve was secured with silk  suture.  Electrocautery utilized to make subcutaneous pocket.  Kanamycin  irrigation utilized to irrigate the pocket.  Electrocautery utilized to  assure hemostasis.  The Medtronic Insync III dual-chamber biventricular  pacemaker serial number [id number] was connected to the atrial RV and LV  leads and placed in the subcutaneous pocket.  Generator secured with  silk suture.  At this point additional  kanamycin was utilized to  irrigate the pocket.  The incision was closed with layer of 2-0 Vicryl  followed by layer of 3-0 Vicryl followed by layer of 4-0 Vicryl.  Benzoin was painted on the skin and Steri-Strips were applied and  patient was prepped for AV node ablation.   After the patient was prepped in the usual manner, a 7-French  quadripolar ablation catheter was inserted percutaneously in the right  femoral vein and advanced to the His bundle region.  The ablation  catheter was easily maneuvered into the region of the AV node.  Two RF  energy applications were subsequently delivered to the region of the AV  node.  The HV interval was 40 milliseconds.  It should be noted that  during RF energy application heart block immediately ensued.  The  patient was observed for approximately 10 minutes and had no AV node  conduction and the catheter was removed.  Hemostasis was assured, the  patient was returned to her room in satisfactory condition.   COMPLICATIONS:  There were no immediate procedure complications.   RESULTS:  This demonstrates successful implantation of a dual-chamber  biventricular pacemaker followed by successful AV node ablation in a  patient with atrial fibrillation, congestive heart failure who has  failed multiple antiarrhythmic drug therapy including amiodarone and  Rythmol and who was intolerant of AV nodal blocking drugs, including  Toprol and Cardizem secondary to her severe lung disease and allergic  reaction.      Doylene Canning. Ladona Ridgel, MD  Electronically Signed     GWT/MEDQ  D:  06/16/2006  T:  06/17/2006  Job:  [id number]   cc:   Francisca December, M.D.  Armanda Magic, M.D.

## 2010-11-30 NOTE — Consult Note (Signed)
[name], [name]             ACCOUNT NO.:  [id number]   MEDICAL RECORD NO.:  [id number]          PATIENT TYPE:  INP   LOCATION:  2904                         FACILITY:  [facility]   PHYSICIAN:  Armanda Magic, M.D.     DATE OF BIRTH:  20-Dec-1930   DATE OF CONSULTATION:  06/15/2006  DATE OF DISCHARGE:                                 CONSULTATION   REFERRING PHYSICIAN:  Celene Kras, M.D.   PRIMARY CARDIOLOGIST:  Francisca December, M.D.   PRIMARY CARE PHYSICIAN:  Donia Guiles, M.D.   CHIEF COMPLAINT:  Palpitations, wide complex tachycardia, and chest  pain.   HISTORY OF PRESENT ILLNESS:  This is 77 year old white female with a  history of paroxysmal atrial fibrillation and has been on Rythmol SR 325  mg b.i.d. until November 20 when she was admitted with recurrent atrial  arrhythmias, primarily atrial flutter, and Rythmol with increased to 425  mg b.i.d..  Since that admission, she has had increasing palpitations  with intermittent chest pressure only during the palpitations.  She has  noted increasing shortness of breath over several weeks.  She called me  this morning on call, very short of breath on the phone, and complaining  of palpitations, instructed to go to the emergency room where she was  found to be in mild shortness of breath and mild congestive heart  failure.  She was admitted to the hospitalist service.  Later this  afternoon, she developed wide complex tachycardia that became fairly  incessant but irregular with breaks of normal sinus rhythm and narrow  complex tachycardia consistent with atrial fibrillation with RVR and  aberration. She did hold Rythmol today because she has been feeling so  poorly.   PAST MEDICAL HISTORY:  1. Includes paroxysmal atrial fibrillation  with prior amiodarone      failure.  2. Hypertension.  3. Normal LV function, EF 55-65% in September 2006.  4. Tobacco abuse.  5. COPD.  6. Depression.  7. Congestive heart failure.  8. Bell's  palsy.  9. Dermatofibroma sarcoma.   ALLERGIES:  She has no known drug allergies.   MEDICATIONS:  1. Rythmol SR 425 mg b.i.d.  2. Coumadin.  3. Hydrochlorothiazide 25 mg 1/2 tablet daily.  4. K-Dur 10 mEq a day.  5. Lexapro 10 mg a day.  6. Spiriva inhaler 1 puff daily.  7. Combivent inhaler p.r.n.   FAMILY HISTORY:  Father died of 63 of cerebral hemorrhage. Her mother  died at 42 of CHF and Alzheimer's.  Her sister died at 19 of liver  cancer.   SOCIAL HISTORY:  She is widowed with one adult daughter.  She lives  independently in [place].  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 She  does smoke about one-half a pack per day but has smoked for 50 years.  She occasionally drinks alcohol.   REVIEW OF SYSTEMS:  Otherwise as seen in HPI is negative.   PHYSICAL EXAMINATION:  VITAL SIGNS: Blood pressure is 92/53, heart rate  121-141 beats per minute.  GENERAL: A well-developed, well-nourished white female in mild  respiratory distress.  HEENT: Benign.  NECK:  Supple without lymphadenopathy, no bruits.  LUNGS:  Have diffuse wheezing.  HEART:  Irregularly irregular and tachycardiac.  ABDOMEN:  Soft, nontender, nondistended.  Normoactive bowel sounds.  No  hepatosplenomegaly.  EXTREMITIES:  No cyanosis, erythema or edema, +2 dorsalis pedis pulses  bilaterally.   LABORATORY DATA:  Sodium 138, potassium 4.5, chloride 104, bicarb 31,  BUN 11, glucose 125. Hemoglobin 17, hematocrit 50. Myoglobin 70.3 and  83.4, MB less than 1 and 1.2, troponin less than 0.05 x2.  BNP 522.   EKG #1 shows wide complex tachycardia which is irregular consistent with  atrial fibrillation with aberrancy. EKG #2 shows normal sinus rhythm  with PAC with aberration versus PVC. EKG #3 shows wide complex  tachycardia with intermittent atrial fibrillation, narrow complex.   ASSESSMENT:  1. Wide complex tachycardia with intermittent narrow complex      tachycardia consistent with atrial fibrillation with  intermittent      aberration in the 120-140 heart rate with intermittent normal sinus      rhythm.  She has failed amiodarone and Rythmol.  2. Dyspnea secondary to congestive heart failure.  3. Mild heart failure with elevated BNP secondary rapid atrial      fibrillation.  4. Wheezing, probably cardiac, congestive heart failure,  but does      have a history of chronic obstructive pulmonary disease with      bronchospasm.  5. Chest pressure only with palpitations. Normal catheterization per      the patient several years ago.  There is no documentation in the      computer, and chart is unavailable  6. Borderline hypotension secondary rapid atrial fibrillation.  7. Tobacco abuse.  8. Chronic obstructive pulmonary disease.   PLAN:  IV fluid bolus 200 mL to increase blood pressure, then Lopressor  2.5 mg IV and then q.6 h as blood pressure tolerates. The patient is  allergic IV CARDIZEM.  IV digitalis if needed for rate control. Hold  Rythmol.  Follow cardiac enzymes.  Check a TSH. Discussed with Dr. Ladona Ridgel.  I  think, given the fact the patient has failed amiodarone and Rythmol, and  she has hemodynamically significant atrial fibrillation, she needs AV  node ablation pacemaker. Dr. Ladona Ridgel is aware and will see the patient.      Armanda Magic, M.D.  Electronically Signed     TT/MEDQ  D:  06/15/2006  T:  06/16/2006  Job:  [id number]   cc:   Donia Guiles, M.D.  Francisca December, M.D.

## 2010-11-30 NOTE — Discharge Summary (Signed)
[name], [name]             ACCOUNT NO.:  [id number]   MEDICAL RECORD NO.:  [id number]          PATIENT TYPE:  INP   LOCATION:  2023                         FACILITY:  [facility]   PHYSICIAN:  Andres Shad. Rudean Curt, MD     DATE OF BIRTH:  12/23/1930   DATE OF ADMISSION:  06/15/2006  DATE OF DISCHARGE:  06/19/2006                               DISCHARGE SUMMARY   DISCHARGE DIAGNOSES:  1. Atrial fibrillation with rapid ventricular response.  2. Status post atrioventricular node ablation and pacemaker placement.  3. Chronic obstructive pulmonary disease exacerbation.  4. Urinary tract infection.  5. Asthma.  6. Hypertension.  7. Smoking.   DISCHARGE MEDICATIONS:  1. Hydrochlorothiazide 25 mg daily.  2. Lisinopril 5 mg daily.  3. Coumadin 5 mg alternating with 2.5 mg daily alternating every other      day.  4. Digoxin 0.25 mg daily.  5. Prednisone taper over 5 days.  6. Spiriva inhaler daily.  7. Combivent inhaler 3x daily until asymptomatic.  8. Lexapro 10 mg daily.  9. K-Dur 20 mEq daily.   SUMMARY OF HOSPITALIZATION:  [name] is a 77 year old woman with a  past medical history significant for COPD, asthma, and atrial  fibrillation who presented on the 2nd of December with the chief  complaint of weakness and shortness of breath which worsened over 24  hours.  She was recently an inpatient at [facility] in late  November, 2007 with an admission diagnosis of atrial flutter.  She was  on a cardiology service at that time and during that admission  spontaneously converted to normal sinus rhythm.  She has been managed  medically by the cardiology service and has had atrial fibrillation that  has been somewhat refractory to a number of medication courses.  She has  also had some medication intolerances, including an allergic reaction to  diltiazem.  On initial evaluation in the emergency room, the patient was  markedly dyspneic, had decreased breath sounds and wheezes  throughout  her lung fields, and had markedly labored breathing.  Her vital signs  were notable for a blood pressure of 179/70, a pulse had varied between  the 50s and the 120s, a respiratory rate of 30, and an oxygen saturation  of 85% on room air.  Her heart rhythm varied between normal sinus  rhythm, sinus bradycardia, and atrial fibrillation with aberrant  conduction and rapid ventricular response.  Her physical exam was  notable for diminished air movement throughout her lung fields and with  prolonged expiration and wheezes.  She had no peripheral edema.  Initial  laboratory studies were notable for largely normal electrolytes, slight  polycythemia with a hematocrit of 50 and hemoglobin of 17 and a venous  pCO2 of 54.  CK-MB and troponin were normal.  Urinalysis showed moderate  hemoglobin, hematocrit, leukocyte esterase, positive nitrites, 7-10  white blood cells, and many bacteria.  A recent echocardiogram had shown  an ejection fraction of 50%.  BNP which was checked the following day  was 391, and her INR was 2.5.  The patient was admitted to the internal  medicine service in a stepdown telemetry bed and Cardiology and  Electrophysiology were promptly consulted.  On the 3rd of December, the  patient was taken to the EP lab where she had an AV node ablation and a  biventricular pacemaker placement.  This procedure was uncomplicated and  the patient recuperated well other than a hematoma at the left  subclavian access site.  During the remainder of her hospitalization,  the patient required some oxygen, but by the 6th of December, she was  breathing comfortably on room air with oxygen saturations 93 to 94%.  She remained afebrile and denied any subjective respiratory symptoms  other than mild wheezing.   ASSESSMENT AND PLAN:  1. Atrial fibrillation.  The patient is now rate controlled with her      pacemaker and atrioventricular node ablation.  She will be sent      home on  Coumadin 5 mg alternating with 2.5 mg as well as digoxin,      and she will follow up with Cardiology on Monday, December 10.  2. Urinary tract infection.  The patient was treated with intravenous      ceftriaxone during her hospitalization.  A follow-up urinalysis      done on December 4 showed rare bacteria and 0-2 white blood cells,      suggesting that she was adequately treated with this regimen.  She      received a total of 5 days of intravenous ceftriaxone.  The urine      culture was not initially sent from the emergency department, so      the bacterial concentration and species is not known; however, I      will not continue further treatment at this time because her urine      completely cleared on her intravenous therapy.  3. Chronic obstructive pulmonary disease exacerbation.  It seems like      bronchospasm related to mild congestive heart failure was the route      cause of this exacerbation.  She is markedly improved at this time.      I will continue her on a tapering course of prednisone over the      next 5 days as well as short and long-acting bronchodilators.  4. Hypertension.  The patient will be discharged on      hydrochlorothiazide and lisinopril.  5. Smoking.  The patient was strongly urged to quit smoking, and she      has resolved to do so.      Andres Shad. Rudean Curt, MD  Electronically Signed     PML/MEDQ  D:  06/19/2006  T:  06/20/2006  Job:  [id number]   cc:   Francisca December, M.D.  Doylene Canning. Ladona Ridgel, MD  Donia Guiles, M.D.

## 2010-11-30 NOTE — Assessment & Plan Note (Signed)
[facility]                               PULMONARY OFFICE NOTE   NAME:[name], [name]                    MRN:          [id number]  DATE:03/20/2006                            DOB:          18-Jan-1931    PROBLEM LIST:  1. Chronic obstructive pulmonary disease.  2. History of lung nodule.  3. Congestive heart failure.  4. Paroxysmal atrial fibrillation.  5. Tobacco abuse.   HISTORY:  She is still smoking about a half a pack a day.  We talked about  Chantix again but she clearly has no motivation to stick with anything.  She  does tell me that she has quit twice this year already.  she recognizes no  change in her breathing.  Little cough.  No phlegm.  No chest pain.  She  mentions that her thighs ache when she walks without calf pain or anginal  type chest pain.  She blamed it on not walking enough.  I discussed this as  possibly reflecting claudication and asked that she follow up with her other  doctors.  She rarely uses her Combivent inhaler and I explained that it  would be going off market.  We will probably switch to Atrovent.  Spiriva  works very well on a regular basis and she has had no acute events this  summer.   CURRENT MEDICATIONS:  1. Warfarin.  2. Potassium 20 mEq.  3. HCTZ 25 mg.  4. Spiriva once daily.  5. Rythmol SR 225 mg q.12.h.  6. Combivent rescue inhaler used occasionally.   OBJECTIVE:  VITAL SIGNS:  Weight:  188 lb.  Blood pressure:  108/64.  Pulse:  Regular at 110.  Room air saturation 98%.  HEART:  Heart sounds are regular.  There is no neck vein distention.  No  carotid bruit.  LUNGS:  Lung fields are quiet, unlabored, clear but distant and expiratory  phase is slow.  There is no peripheral edema or cyanosis.   IMPRESSION:  Stable chronic obstructive pulmonary disease with background of  paroxysmal atrial fibrillation because of which I have been avoiding  stimulant bronchodilators unless we really need.  Note,  that computerized  tomography scan in May was stable back to 2004 showing no change in  scattered small pulmonary nodules consistent with benign scarring.  We will  drop this issue for now.   PLAN:  Smoking cessation was reemphasized.  Walk as tolerated but see her  other doctors about the exertional leg pain.  Scheduled to return to me in  six months, earlier p.r.n.                                   Clinton D. Maple Hudson, MD, [facility], FACP   CDY/MedQ  DD:  03/20/2006  DT:  03/20/2006  Job #:  [id number]   cc:   Francisca December, M.D.  Donia Guiles, M.D.

## 2010-11-30 NOTE — H&P (Signed)
[name], [name]             ACCOUNT NO.:  [id number]   MEDICAL RECORD NO.:  [id number]          PATIENT TYPE:  INP   LOCATION:  3713                         FACILITY:  [facility]   PHYSICIAN:  Francisca December, M.D.  DATE OF BIRTH:  1930-11-28   DATE OF ADMISSION:  04/06/2005  DATE OF DISCHARGE:                                HISTORY & PHYSICAL   REASON FOR ADMISSION:  Shortness of breath and tachycardia.   HISTORY OF PRESENT ILLNESS:  [name] is a 77 year old woman well  known to me with long history of paroxysmal atrial fibrillation.  She was  recently seen in the early part of September complaining of intermittent  palpitations and extreme fatigue and lightheadedness.  A 2-week event  monitor was obtained and documented paroxysms of atrial fibrillation with  rapid ventricular response associated with these symptoms.  She had been  chronically maintained on amiodarone 200 mg.  This was increased to 800 mg  p.o. daily for 7 days and then 400 mg p.o. daily.  She called earlier this  evening with complaints of marked dyspnea and significant fatigue.  This  worsened significantly with exertion.  She has orthopnea.  She denies any  cough or sputum production, no fevers.  She was speaking in only fragmented  sentences.  I requested she come to [facility] Emergency Room, and she will be  admitted at this time for control of rapid ventricular response of atrial  fibrillation and what appears to be a COPD exacerbation at this point.   PAST MEDICAL HISTORY:  1.  Paroxysmal atrial fibrillation, known hypertensive heart disease, normal      LV systolic function.  2.  History of COPD>  3.  Tobacco abuse.  4.  Hypertension.  5.  Depression.   CURRENT MEDICATIONS:  1.  Amiodarone 400 mg p.o. daily.  2.  Benicar 20 mg p.o. daily.  3.  Hydrochlorothiazide 25 mg p.o. daily.  4.  Lexapro 10 mg p.o. daily.   ALLERGIES:  No known drug allergies.   SOCIAL HISTORY:  She is widowed,  accompanied in the emergency room by her  daughter this evening.  She lives independently, is retired.  She smokes  upwards of a pack of cigarettes a day.  No alcohol.   FAMILY HISTORY:  Noncontributory.   REVIEW OF SYSTEMS:  She denies any visual changes or headache.  No  difficulty with hearing.  No trouble with swallowing.  No cough or  hemoptysis, no hematochezia.  No abdominal pain, melena, diarrhea,  constipation.  No hematuria, nocturia, dysuria.  She has no significant  arthritic changes except in her hands.  No muscle weakness or pain.  Never  had a stroke to her knowledge.   PHYSICAL EXAMINATION:  VITAL SIGNS: Blood pressure 160/70, pulse 125 and  fairly regular, respiratory rate 32, temperature 96.4, O2 saturation on room  air 95%.  GENERAL:  This is a somewhat ill-appearing 77 year old woman using accessory  muscles of respirations, speaking in fragmented sentences.  HEENT:  Unremarkable.  Head is atraumatic and normocephalic.  Pupils equal,  round, and reactive to light  and accommodation.  Extraocular movements are  intact.  Sclerae anicteric.  Oral mucosa somewhat dry but pink.  Teeth and  gums in good repair.  NECK:  Supple.  There is no thyromegaly or masses.  Carotid upstrokes are  normal.  There is no bruit.  There is no JVD.  CHEST:  Loud inspiratory and expiratory wheezes throughout.  No rales.  HEART:  Tachycardic.  No murmur or gallop.  ABDOMEN:  Soft, nontender.  No midline pulsatile mass.  Bowel sounds present  in all quadrants.  No hepatomegaly.  EXTERNAL GENITALIA:  Without lesions, atrophic.  RECTAL:  Not performed.  EXTREMITIES:  Full range of motion, no edema.  Distal pulses are intact.  NEUROLOGIC:  Cranial nerves II-XII intact.  Motor and sensory grossly  intact.  Gait not tested.  SKIN:  Warm, dry, and clear.   ACCESSORY CLINICAL DATA:  Hemogram is normal as are the serum electrolytes.  BUN is 18, creatinine 1.3, glucose 99.   Electrocardiogram  shows atrial fibrillation/flutter at rate of 110 to 125.   Chest x-ray: No edema.  Normal heart size.  Changes of COPD.  Other  laboratory assessment including cardiac enzymes and BNP are pending at this  time.   IMPRESSION:  1.  Recurrent atrial fibrillation with uncontrolled ventricular response.      This has led to probably mild congestive heart failure, which is not      clinically detectable, but also bronchitis/bronchospasm.  2.  Normal left ventricular systolic function and no significant valvular      lesions by my recollection of her last echocardiogram.  3.  Chronic obstructive pulmonary disease ongoing exacerbation.  4.  Hypertension.  5.  Tobacco abuse, ongoing.  6.  History of depression.   PLAN:  1.  The patient is admitted for subcutaneous Lovenox and IV metoprolol as      well as IV Cardizem for rate control.  2.  She will be treated with fairly mild doses of furosemide in case there      is a component of heart failure.  3.  We will obtain a 2-D echocardiogram.  4.  Xopenex nebulization q.4-6 h.  5.  Continue amiodarone 400 mg p.o. daily, Lexapro 10 mg p.o. daily.  Will      apply a nicotine patch 14 mg to skin.  6.  Strict I's and O's, daily weights.  7.  CK-MB and troponin q.12 h. x2.  8.  CBC every 3 days.  9.  Check an amiodarone level.   Will likely need TEE-guided cardioversion in the near future, and will  obtain tobacco cessation counselor support.   In the emergency room, she has received IV Cardizem 15 mg, Xopenex  nebulization, subcutaneous Lovenox, and IV metoprolol.      Francisca December, M.D.  Electronically Signed     JHE/MEDQ  D:  04/06/2005  T:  04/07/2005  Job:  [id number]   cc:   Donia Guiles, M.D.  Fax: [phone]

## 2010-11-30 NOTE — Assessment & Plan Note (Signed)
[facility]                         ELECTROPHYSIOLOGY OFFICE NOTE   NAME:HOFFMANVyolet, Lisa                    MRN:          [id number]  DATE:07/16/2006                            DOB:          1931-01-23    [name] returns today for followup.  She is a very pleasant 77-year-  old woman with a history of longstanding lung disease, persistent atrial  fibrillation, having failed amiodarone and Rythmol, who underwent AV  node ablation and pacemaker insertion a month ago.  Her procedure was  complicated by fairly significant postoperative hematoma.  The patient  states that since discharge from the hospital she continues to have  problems with weakness and fatigue.  Many of her medications have been  discontinued.  She is still on Digoxin and Coumadin and inhalers.  She  has also been on intermittent diuretic therapy with HCTZ.  The patient  states that she feels poorly intermittently.  She denies palpitations.  Over the last week, her symptoms have improved.  She denies syncope.  The patient denies dietary indiscretion.   PHYSICAL EXAMINATION:  Her physical examination today is notable for a  pleasant 77 year old woman in no acute distress.  The blood pressure was  118/74, the pulse was 76 and regular, the respirations were 18, the  weight was 183 pounds.  NECK:  No jugular venous distention.  LUNGS:  Clear bilaterally to auscultation.  There were no wheezes, rales  or rhonchi.  CARDIOVASCULAR:  Regular rate, normal S1 and S2.  There were no murmurs,  rubs or gallops.  EXTREMITIES:  No cyanosis, clubbing or edema.   EKG:  AV sequential pacing at 70 BPM.   Interrogation of her pacemaker demonstrates a [employer] In-Sync-3 with T-  waves greater than 2, R-waves greater than 11, a pace impedence of 476  in the atrium, 548 in the right ventricle, 646 in the left ventricle.  Threshold 0.5 at 0.4 in both the atrium, the right ventricle, and 1.5 at  0.4 in the left ventricle.  Battery voltage was 3.04 volts.  Her output  was turned down to 80 BPM 1 week ago.   IMPRESSION:  1. Symptomatic atrial fibrillation with a rapid ventricular response,      having failed amiodarone and Rythmol in the past.  2. Prozac-arrhythmia on Rythmol.  3. Status post node ablation and Bi-V pacemaker implantation.  4. Congestive heart failure, now with transient hypotension.   DISCUSSION:  [name]'s blood pressure has actually improved in the  last week.  By her histogram report, she has been out of rhythm  approximately 14% of the time since her pacemaker was placed.  The  patient does note that she had improved in the last week or so.  I have  recommended that we undergo a period of watchful waiting, that she  continue on her Digoxin and Coumadin today, and that she follow back in  approximately 1-2 months.  If her atrial fibrillation has increased in  severity, then consideration for a trial of Tikosyn would be a  consideration.  If her atrial fibrillation is fairly minimal, then we  would continue on with her present medical therapy.     Doylene Canning. Ladona Ridgel, MD  Electronically Signed    GWT/MedQ  DD: 07/16/2006  DT: 07/16/2006  Job #: [id number]   cc:   Francisca December, M.D.

## 2010-11-30 NOTE — Op Note (Signed)
[name], [name]             ACCOUNT NO.:  [id number]   MEDICAL RECORD NO.:  [id number]          PATIENT TYPE:  AMB   LOCATION:  DSC                          FACILITY:  [facility]   PHYSICIAN:  Gita Kudo, M.D. DATE OF BIRTH:  04/11/1931   DATE OF PROCEDURE:  11/13/2004  DATE OF DISCHARGE:                                 OPERATIVE REPORT   OPERATIVE PROCEDURE:  Excision two skin lesions - left forearm and right  chest.   SURGEON:  Gita Kudo, M.D.   ANESTHESIA:  MAC - IV sedation, local 1% Xylocaine.   PREOPERATIVE DIAGNOSIS:  Two skin lesions, possible cancer.   POSTOPERATIVE DIAGNOSIS:  Two skin lesions, possible cancer, pending  pathology.   CLINICAL SUMMARY:  A 77 year old female with rather recent development of  skin lesions. She gives a remote history of approximately 1992 of a skin  cancer removed by Dr. Maple Hudson and then follow-up surgery in [facility] with  wide excision and skin grafting. Do not know the pathology. She comes in to  have these two newer lesions removed.   OPERATIVE FINDINGS:  The two lesions were raised, not ulcerated. They were  approximately 3.5 cm in size each.   OPERATIVE PROCEDURE:  The patient was given intravenous sedation and  positioned, prepped and draped in standard fashion. The left forearm was  approached first. After infiltration of Xylocaine, a longitudinally oriented  elliptical incision was made with wide but conservative margins around the  area. This was carried down into the fat and then the lesion removed, marked  with a suture and sent for pathology. The wound was closed with a  combination of 3-0 Vicryl, 3-0 nylon and 4-0 nylon, followed by Steri-Strips  and a sterile dressing.   The right skin lesion under the clavicle was approached in a similar  fashion. Again the Xylocaine with epinephrine was infiltrated -  approximately 20 cc. Elliptical incision based obliquely and then closure of  the same.  Specimen sent  after marking.  Sterile dressings were applied. The  patient went to the recovery room to be discharged and followed as an  outpatient.   ADDENDUM:  A preoperative chest x-ray showed a new lesion - right upper  lung. Possibly malignant - primary versus metastatic. We will get a CT scan  and workup as an outpatient after she returns to the office.      MRL/MEDQ  D:  11/13/2004  T:  11/13/2004  Job:  [id number]   cc:   Donia Guiles, M.D.  [address] [place]  [place] [address]  Fax: [phone]

## 2010-11-30 NOTE — Consult Note (Signed)
[name], [name]             ACCOUNT NO.:  [id number]   MEDICAL RECORD NO.:  [id number]          PATIENT TYPE:  INP   LOCATION:  3713                         FACILITY:  [facility]   PHYSICIAN:  Theone Stanley, MD   DATE OF BIRTH:  1930-10-25   DATE OF CONSULTATION:  04/07/2005  DATE OF DISCHARGE:                                   CONSULTATION   REQUESTING PHYSICIAN:  Francisca December, M.D., of [facility].   REASON FOR CONSULTATION:  COPD management.   HISTORY OF PRESENT ILLNESS:  [name] is a very pleasant 77 year old  female whose main complaint is increasing shortness of breath, chest  pressure, weakness, fatigue.  The shortness of breath is ongoing since she  does have an ongoing COPD; however, she has had acute exacerbation over the  past week.  She normally coughs and does not feel there is any increase in  cough or sputum production; however, since Wednesday of last week she had  had increasing wheezing episodes.  No fever or chills.  The patient states  that she has a feeling of an elephant sitting on her chest, weakness and  fatigue x2 weeks.  In addition, she has orthopnea, which she normally has  but has gotten progressively worse.  Overall there is a feeling from the  patient that since they increased her amiodarone, she has had problems.  She  denies being around any sick people and has not traveled out of [place].   PAST MEDICAL HISTORY:  1.  The patient has had a diagnosis of COPD for the past seven to eight      years.  He has no home O2.  She still smokes.  2.  She has atrial fibrillation.  3.  Hypertension.  4.  Depression.  5.  History of Bell's palsy.  6.  History of dermatofibrosarcoma removed in 1995 and 1996.   MEDICATIONS:  1.  Amiodarone 400 mg one daily.  2.  Benicar/hydrochlorothiazide 20/12.5 mg one p.o. daily.   The patient states that she does not use any nebulizers.  She was using  Spiriva; however, because she did not feel  this helped, she stopped that.   ALLERGIES:  No known drug allergies.   FAMILY HISTORY:  None.   SOCIAL HISTORY:  The patient lives in [place], is divorced, has one  child.  Smokes one pack a day for greater or equal than 50 years.  No  alcohol or illicit drug use.   REVIEW OF SYSTEMS:  Please see HPI.  Otherwise, all systems were negative.   PHYSICAL EXAMINATION:  HEENT:  Head atraumatic, normocephalic.  Eyes 4 mm,  equal and reactive to light.  Extraocular movements intact.  Ears are normal  without discharge.  Throat clear, no erythema, no exudate.  Mucosa moist.  NECK:  Supple, no lymphadenopathy, no JVD.  CARDIAC:  Regular rate and rhythm, no murmurs, rubs or gallops appreciated.  LUNGS:  The patient had expiratory wheezing bilaterally with some evidence  of diminished air movement.  EXTREMITIES:  No edema, cyanosis or clubbing.  The patient's hand  bilaterally have a bluish discoloration.  She states this has happened after  starting amiodarone.  NEUROLOGIC:  The patient is alert and oriented x3.  5/5 strength in the  upper and lower extremities.  GENITOURINARY:  Deferred.   LABS, RADIOLOGY:  A chest x-ray showed COPD/emphysema.  No infiltrates.   White count of 7, hemoglobin 14, hematocrit 46, platelets of 212.  Sodium  139, potassium 4.2, chloride at 106, BUN at 18, glucose at 99, creatinine at  1.3.  B-natriuretic peptide was 361.  Initial cardiac enzymes were negative.  EKG shows no acute changes.   ASSESSMENT AND PLAN:  1.  Chronic obstructive pulmonary disease exacerbation.  Based on the      patient's presentation, I suspect that this is a chronic obstructive      pulmonary disease exacerbation; however, with the patient being on      amiodarone, we need to consider a pulmonary process secondary to      amiodarone although this is less likely.  Also, cardiac etiology should      be considered.  She has an elevated BNP.  In the meantime, I will treat      her as  if she has a chronic obstructive pulmonary disease exacerbation,      place her on Xopenex and Atrovent for the first 24 hours and then switch      her to Spiriva.  This has been clinically shown to be better since it is      longer-acting and give her azithromycin IV and ask for a smoking      cessation consult, guaifenesin 600 mg one p.o. b.i.d., ABG in the      morning, also start her on Solu-Medrol tonight and switch her to      prednisone tomorrow.  Lovenox subcu.  Protonix.  A single dose of Lasix.      I am sure cardiology would consider an echocardiogram, but I will leave      that up to them.  2.  Hypertension.  She is on Benicar/hydrochlorothiazide.  We will leave      that up to cardiology whether they would like to continue this or not.  3.  Lexapro.  Will continue with this here in the hospital 10 mg one p.o.      daily.  4.  Paroxysmal atrial fibrillation.  The patient is currently in atrial      fibrillation.  Cardiology will be managing it.      Theone Stanley, MD  Electronically Signed     AEJ/MEDQ  D:  04/06/2005  T:  04/08/2005  Job:  [id number]

## 2010-12-05 ENCOUNTER — Ambulatory Visit (INDEPENDENT_AMBULATORY_CARE_PROVIDER_SITE_OTHER): Payer: Medicare Other | Admitting: *Deleted

## 2010-12-05 DIAGNOSIS — I4891 Unspecified atrial fibrillation: Secondary | ICD-10-CM

## 2010-12-05 LAB — POCT INR: INR: 2.3

## 2011-01-01 ENCOUNTER — Telehealth: Payer: Self-pay | Admitting: Internal Medicine

## 2011-01-01 MED ORDER — POTASSIUM CHLORIDE ER 10 MEQ PO TBCR: 10.0000 meq | EXTENDED_RELEASE_TABLET | Freq: Every day | ORAL | Status: DC

## 2011-01-01 MED ORDER — WARFARIN SODIUM 5 MG PO TABS: ORAL_TABLET | ORAL | Status: DC

## 2011-01-01 NOTE — Telephone Encounter (Signed)
Refill of coumadin 5 mg and khlor con uses [facility] said was requested last week pt almost out needs asap

## 2011-01-02 ENCOUNTER — Other Ambulatory Visit: Payer: Self-pay | Admitting: *Deleted

## 2011-01-02 ENCOUNTER — Ambulatory Visit (INDEPENDENT_AMBULATORY_CARE_PROVIDER_SITE_OTHER): Payer: Medicare Other | Admitting: *Deleted

## 2011-01-02 DIAGNOSIS — I4891 Unspecified atrial fibrillation: Secondary | ICD-10-CM

## 2011-01-02 LAB — POCT INR: INR: 2.1

## 2011-01-02 MED ORDER — HYDROCHLOROTHIAZIDE 25 MG PO TABS: ORAL_TABLET | ORAL | Status: DC

## 2011-01-14 ENCOUNTER — Encounter: Payer: Self-pay | Admitting: Internal Medicine

## 2011-01-15 ENCOUNTER — Encounter: Payer: Self-pay | Admitting: Internal Medicine

## 2011-01-15 ENCOUNTER — Ambulatory Visit (INDEPENDENT_AMBULATORY_CARE_PROVIDER_SITE_OTHER): Payer: Medicare Other | Admitting: Internal Medicine

## 2011-01-15 VITALS — BP 143/88 | HR 88 | Ht 63.0 in | Wt 163.4 lb

## 2011-01-15 DIAGNOSIS — I4891 Unspecified atrial fibrillation: Secondary | ICD-10-CM

## 2011-01-15 DIAGNOSIS — F17200 Nicotine dependence, unspecified, uncomplicated: Secondary | ICD-10-CM

## 2011-01-15 DIAGNOSIS — I428 Other cardiomyopathies: Secondary | ICD-10-CM

## 2011-01-15 DIAGNOSIS — Z45018 Encounter for adjustment and management of other part of cardiac pacemaker: Secondary | ICD-10-CM | POA: Insufficient documentation

## 2011-01-15 DIAGNOSIS — I5032 Chronic diastolic (congestive) heart failure: Secondary | ICD-10-CM

## 2011-01-15 DIAGNOSIS — I509 Heart failure, unspecified: Secondary | ICD-10-CM

## 2011-01-15 LAB — PACEMAKER DEVICE OBSERVATION
BATTERY VOLTAGE: 2.964 V
LV LEAD THRESHOLD: 1.5 V
VENTRICULAR PACING PM: 100

## 2011-01-15 NOTE — Assessment & Plan Note (Signed)
Her ventricular rate appears to be well controlled status post AV node ablation. She will continue her current medical therapy.

## 2011-01-15 NOTE — Assessment & Plan Note (Signed)
I discussed the importance of stopping smoking. She states that when she comes back to see his again, she will continue to reduce her tobacco consumption.

## 2011-01-15 NOTE — Progress Notes (Signed)
HPI [name] returns today for followup. She is a pleasant 77 year old woman with a history of chronic atrial fibrillation, symptomatic bradycardia, status post by the pacemaker insertion. She has chronic congestive heart failure class I to class II. The patient continues to smoke cigarettes though she is down to less than half a pack a day. She denies chest pain or peripheral edema. No claudication. She has no PND and no orthopnea. Allergies  Allergen Reactions  . Warfarin Sodium     REACTION: hives, (can take coumadin)     Current Outpatient Prescriptions  Medication Sig Dispense Refill  . albuterol (PROAIR HFA) 108 (90 BASE) MCG/ACT inhaler Inhale 2 puffs into the lungs every 6 (six) hours as needed.        Marland Kitchen escitalopram (LEXAPRO) 10 MG tablet Take 10 mg by mouth daily.        . hydrochlorothiazide 25 MG tablet 1/2 tab po qd  30 tablet  12  . potassium chloride (K-DUR) 10 MEQ tablet Take 1 tablet (10 mEq total) by mouth daily.  30 tablet  12  . tiotropium (SPIRIVA) 18 MCG inhalation capsule Place 18 mcg into inhaler and inhale daily.        Marland Kitchen warfarin (COUMADIN) 5 MG tablet Take as directed by Anticoagulation clinic- Pt needs brand name Coumadin    30 tablet  3     Past Medical History  Diagnosis Date  . Tobacco abuse   . Paroxysmal atrial fibrillation     AICD/pacemaker  . Pulmonary nodule   . COPD (chronic obstructive pulmonary disease)     Spirometry 06/10/05, FEV1 46% and ratio 51% with 14% better after albuterol    ROS:   All systems reviewed and negative except as noted in the HPI.   Past Surgical History  Procedure Date  . Pacemaker insertion   . Soft tissue tumor resection     From right forearm  . Skin graft      Family History  Problem Relation Age of Onset  . Other Neg Hx     Respiratory disease     History   Social History  . Marital Status: Divorced    Spouse Name: N/A    Number of Children: N/A  . 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 Smoking status: Current Everyday Smoker -- 1.0 packs/day for 50 years  . Smokeless tobacco: Not on file   Comment: Counseling  . Alcohol Use: Not on file  . Drug Use: Not on file  . Sexually Active: Not on file   Other Topics Concern  . Not on file   Social History Narrative   Divorced1 daughter     BP 143/88  Pulse 88  Ht 5\' 3"  (1.6 m)  Wt 163 lb 6.4 oz (74.118 kg)  BMI 28.95 kg/m2  Physical Exam:  Well appearing elderly woman, NAD HEENT: Unremarkable Neck:  No JVD, no thyromegally Lymphatics:  No adenopathy Back:  No CVA tenderness Lungs:  Clear. Well-healed pacemaker incision. HEART:  Iregular rate rhythm, no murmurs, no rubs, no clicks Abd:  Soft, positive bowel sounds, no organomegally, no rebound, no guarding Ext:  2 plus pulses, no edema, no cyanosis, no clubbing Skin:  No rashes no nodules Neuro:  CN II through XII intact, motor grossly intact  DEVICE  Normal device function.  See PaceArt for details.   Assess/Plan:

## 2011-01-15 NOTE — Assessment & Plan Note (Signed)
Her device is working normally. We'll recheck in several months. 

## 2011-01-15 NOTE — Assessment & Plan Note (Signed)
Her symptoms are class II. She will continue her current medical therapy. She will maintain a low-sodium diet. 

## 2011-01-15 NOTE — Patient Instructions (Addendum)
Your physician wants you to follow-up in: 12 months with Dr.TaylorYou will receive a reminder letter in the mail two months in advance. If you don't receive a letter, please call our office to schedule the follow-up appointment.   6 months with Paula/Kristin in the pacer clinic.

## 2011-01-30 ENCOUNTER — Encounter: Payer: Medicare Other | Admitting: *Deleted

## 2011-02-07 ENCOUNTER — Ambulatory Visit (INDEPENDENT_AMBULATORY_CARE_PROVIDER_SITE_OTHER): Payer: Medicare Other | Admitting: *Deleted

## 2011-02-07 DIAGNOSIS — I4891 Unspecified atrial fibrillation: Secondary | ICD-10-CM

## 2011-03-01 ENCOUNTER — Encounter: Payer: Self-pay | Admitting: Internal Medicine

## 2011-03-04 ENCOUNTER — Encounter: Payer: Self-pay | Admitting: Internal Medicine

## 2011-03-04 ENCOUNTER — Ambulatory Visit (INDEPENDENT_AMBULATORY_CARE_PROVIDER_SITE_OTHER): Payer: Medicare Other | Admitting: Internal Medicine

## 2011-03-04 VITALS — BP 122/82 | HR 83 | Ht 64.5 in | Wt 162.4 lb

## 2011-03-04 DIAGNOSIS — J449 Chronic obstructive pulmonary disease, unspecified: Secondary | ICD-10-CM

## 2011-03-04 DIAGNOSIS — J984 Other disorders of lung: Secondary | ICD-10-CM

## 2011-03-04 MED ORDER — ALBUTEROL SULFATE HFA 108 (90 BASE) MCG/ACT IN AERS: 2.0000 | INHALATION_SPRAY | Freq: Four times a day (QID) | RESPIRATORY_TRACT | Status: DC | PRN

## 2011-03-04 NOTE — Progress Notes (Signed)
Subjective:    Patient ID: [name], female    DOB: January 13, 1931, 77 y.o.   MRN: [id number]  HPI 03/04/11- 63 yo F smoker followed for tobacco abuse, COPD, hx lung nodule, complicated by PAFib, chronic diastolic heart failure/ pacemaker/ coumadin Last here September 03, 2010- Still smoking about 1/2 ppd, feels she is doing better and we kept the issue up and active, reminding again of our many conversations about this.  Spiriva really helps wheeze with minor dry mouth.  CXR- 09/03/10 COPD, ICD, but no new or active process. Has had more than one Pneumovax- I told her that was probably sufficient. She commented on her tendency to wait too long before seeking help. Gets prednisone about once/ year.  Review of Systems Constitutional:   No-   weight loss, night sweats, fevers, chills, fatigue, lassitude. HEENT:   No-  headaches, difficulty swallowing, tooth/dental problems, sore throat,       No-  sneezing, itching, ear ache, nasal congestion, post nasal drip,  CV:  No-   chest pain, orthopnea, PND, swelling in lower extremities, anasarca, dizziness, palpitations Resp: No- acute  shortness of breath with exertion or at rest.              Little   productive cough,  No non-productive cough,  No-  coughing up of blood.              No-   change in color of mucus.  No- wheezing.   Skin: No-   rash or lesions. GI:  No-   heartburn, indigestion, abdominal pain, nausea, vomiting, diarrhea,                 change in bowel habits, loss of appetite GU: No-   dysuria, change in color of urine, no urgency or frequency.  No- flank pain. MS:  No-   joint pain or swelling.  No- decreased range of motion.  No- back pain. Neuro- grossly normal to observation, Or:  Psych:  No- change in mood or affect. No depression or anxiety.  No memory loss.      Objective:   Physical Exam General- Alert, Oriented, Affect-appropriate, Distress- none acute bright and cheerfull affect today Skin- rash-none, lesions-  none, excoriation- none Lymphadenopathy- none Head- atraumatic            Eyes- Gross vision intact, PERRLA, conjunctivae clear secretions            Ears- Hearing, canals normal             Nose- Clear, No-Septal dev, mucus, polyps, erosion, perforation             Throat- Mallampati II , mucosa clear , drainage- none, tonsils- atrophic Neck- flexible , trachea midline, no stridor , thyroid nl, carotid no bruit Chest - symmetrical excursion , unlabored           Heart/CV- RRR( paced) , no murmur , no gallop  , no rub, nl s1 s2                           - JVD- none , edema- none, stasis changes- none, varices- none           Lung- clear to P&A, wheeze- none, cough- none , dullness-none, rub- none    Surprisingly clear today           Chest wall-  Abd- tender-no, distended-no, bowel sounds-present, HSM- no Br/  Gen/ Rectal- Not done, not indicated Extrem- cyanosis- none, clubbing, none, atrophy- none, strength- nl Neuro- grossly intact to observation         Assessment & Plan:

## 2011-03-04 NOTE — Patient Instructions (Signed)
We are glad you feel well.  Please keep trying to cut down and stop your cigarettes.

## 2011-03-07 ENCOUNTER — Ambulatory Visit (INDEPENDENT_AMBULATORY_CARE_PROVIDER_SITE_OTHER): Payer: Medicare Other | Admitting: *Deleted

## 2011-03-07 DIAGNOSIS — I4891 Unspecified atrial fibrillation: Secondary | ICD-10-CM

## 2011-03-08 ENCOUNTER — Encounter: Payer: Self-pay | Admitting: Internal Medicine

## 2011-03-08 NOTE — Assessment & Plan Note (Signed)
Severe COPD  and still smoking, but has been stable. Present meds are sufficient.

## 2011-03-08 NOTE — Assessment & Plan Note (Signed)
We will watch with CXR but no concerning nodule is apparent now.

## 2011-03-29 ENCOUNTER — Other Ambulatory Visit: Payer: Self-pay | Admitting: Internal Medicine

## 2011-04-04 ENCOUNTER — Ambulatory Visit (INDEPENDENT_AMBULATORY_CARE_PROVIDER_SITE_OTHER): Payer: Medicare Other | Admitting: *Deleted

## 2011-04-04 DIAGNOSIS — I4891 Unspecified atrial fibrillation: Secondary | ICD-10-CM

## 2011-04-04 LAB — POCT INR: INR: 3

## 2011-05-02 ENCOUNTER — Ambulatory Visit (INDEPENDENT_AMBULATORY_CARE_PROVIDER_SITE_OTHER): Payer: Medicare Other | Admitting: *Deleted

## 2011-05-02 DIAGNOSIS — I4891 Unspecified atrial fibrillation: Secondary | ICD-10-CM

## 2011-05-02 LAB — POCT INR: INR: 2.8

## 2011-05-02 MED ORDER — WARFARIN SODIUM 5 MG PO TABS: ORAL_TABLET | ORAL | Status: DC

## 2011-06-12 ENCOUNTER — Telehealth: Payer: Self-pay | Admitting: Internal Medicine

## 2011-06-12 NOTE — Telephone Encounter (Signed)
Pt calling re appt, made for pt 07-25-11 @ 12p

## 2011-06-13 ENCOUNTER — Ambulatory Visit (INDEPENDENT_AMBULATORY_CARE_PROVIDER_SITE_OTHER): Payer: Medicare Other | Admitting: *Deleted

## 2011-06-13 DIAGNOSIS — I4891 Unspecified atrial fibrillation: Secondary | ICD-10-CM

## 2011-06-13 LAB — POCT INR: INR: 2.3

## 2011-07-01 ENCOUNTER — Ambulatory Visit: Payer: Medicare Other | Admitting: Internal Medicine

## 2011-07-22 ENCOUNTER — Encounter: Payer: Self-pay | Admitting: Internal Medicine

## 2011-07-22 ENCOUNTER — Ambulatory Visit (INDEPENDENT_AMBULATORY_CARE_PROVIDER_SITE_OTHER): Payer: Medicare Other | Admitting: Internal Medicine

## 2011-07-22 VITALS — BP 120/80 | HR 88 | Ht 64.0 in | Wt 159.2 lb

## 2011-07-22 DIAGNOSIS — J449 Chronic obstructive pulmonary disease, unspecified: Secondary | ICD-10-CM

## 2011-07-22 DIAGNOSIS — F17200 Nicotine dependence, unspecified, uncomplicated: Secondary | ICD-10-CM

## 2011-07-22 NOTE — Patient Instructions (Signed)
Continue present meds- please call as needed   

## 2011-07-22 NOTE — Progress Notes (Signed)
Subjective:    Patient ID: [name], female    DOB: 1930-09-06, 78 y.o.   MRN: [id number]  HPI 03/04/11- 78 yo F smoker followed for tobacco abuse, COPD, hx lung nodule, complicated by PAFib, chronic diastolic heart failure/ pacemaker/ coumadin Last here September 03, 2010- Still smoking about 1/2 ppd, feels she is doing better and we kept the issue up and active, reminding again of our many conversations about this.  Spiriva really helps wheeze with minor dry mouth.  CXR- 09/03/10 COPD, ICD, but no new or active process. Has had more than one Pneumovax- I told her that was probably sufficient. She commented on her tendency to wait too long before seeking help. Gets prednisone about once/ year.   07/22/11- 78 yo F smoker followed for tobacco abuse, COPD, hx lung nodule, complicated by PAFib, chronic diastolic heart failure/ pacemaker/ coumadin  She is pleased to report feeling extremely well at this time.  She had flu vaccine. Has not had a significant respiratory illness this winter.  Still smoking just under 1 pack per day. I talked with her again about smoking cessation options and motivation.  She likes Spiriva.  *RADIOLOGY REPORT* 08/2010 Clinical Data: Shortness of breath and wheezing. Smoker.  CHEST - 2 VIEW  Comparison: 09/04/2009  Findings: There is a left chest wall ICD with leads in the right  atrial appendage and right ventricle.  The heart size is normal.  No pleural effusions or pulmonary edema.  Increased lung volumes.  There are coarsened interstitial markings suggestive of COPD.  No airspace consolidation.  Review of the visualized osseous structures is unremarkable.  IMPRESSION:  1. No acute cardiopulmonary abnormalities.  Original Report Authenticated By: Rosealee Albee, M.D.    Review of Systems Constitutional:   No-   weight loss, night sweats, fevers, chills, fatigue, lassitude. HEENT:   No-  headaches, difficulty swallowing, tooth/dental problems, sore  throat,       No-  sneezing, itching, ear ache, nasal congestion, post nasal drip,  CV:  No-   chest pain, orthopnea, PND, swelling in lower extremities, anasarca, dizziness, palpitations Resp: No- acute  shortness of breath with exertion or at rest.              Little   productive cough,  No non-productive cough,  No-  coughing up of blood.              No-   change in color of mucus.  No- wheezing.   Skin: No-   rash or lesions. GI:  No-   heartburn, indigestion, abdominal pain, nausea, vomiting, diarrhea,                 change in bowel habits, loss of appetite GU: MS:  No-   joint pain or swelling.  No- decreased range of motion.  No- back pain. Neuro- grossly normal to observation, Or:  Psych:  No- change in mood or affect. No depression or anxiety.  No memory loss.      Objective:   Physical Exam General- Alert, Oriented, Affect-appropriate, Distress- none acute, bright and cheerfull affect today Skin- rash-none, lesions- none, excoriation- none Lymphadenopathy- none Head- atraumatic            Eyes- Gross vision intact, PERRLA, conjunctivae clear secretions            Ears- Hearing, canals normal             Nose- Clear, No-Septal dev, mucus, polyps, erosion,  perforation             Throat- Mallampati II , mucosa clear , drainage- none, tonsils- atrophic Neck- flexible , trachea midline, no stridor , thyroid nl, carotid no bruit Chest - symmetrical excursion , unlabored           Heart/CV- RRR( paced) , no murmur , no gallop  , no rub, nl s1 s2                           - JVD- none , edema- none, stasis changes- none, varices- none           Lung- clear to P&A, distant, wheeze- none, cough- none , dullness-none, rub- none    Surprisingly clear today           Chest wall-  Abd- tender-no, distended-no, bowel sounds-present, HSM- no Br/ Gen/ Rectal- Not done, not indicated Extrem- cyanosis- none, clubbing, none, atrophy- none, strength- nl Neuro- grossly intact to  observation

## 2011-07-23 NOTE — Assessment & Plan Note (Signed)
Well controlled chronic bronchitis and emphysema. She looks better today than I have seen her in quite a long time. Hopefully she will get through this winter without an acute viral infection. We reviewed medications again.

## 2011-07-23 NOTE — Assessment & Plan Note (Signed)
Smoking cessation counseling an attempted motivation again today.

## 2011-07-25 ENCOUNTER — Ambulatory Visit (INDEPENDENT_AMBULATORY_CARE_PROVIDER_SITE_OTHER): Payer: Medicare Other | Admitting: *Deleted

## 2011-07-25 ENCOUNTER — Encounter: Payer: Self-pay | Admitting: Internal Medicine

## 2011-07-25 ENCOUNTER — Other Ambulatory Visit: Payer: Self-pay | Admitting: Internal Medicine

## 2011-07-25 DIAGNOSIS — I495 Sick sinus syndrome: Secondary | ICD-10-CM

## 2011-07-25 DIAGNOSIS — I4891 Unspecified atrial fibrillation: Secondary | ICD-10-CM

## 2011-07-25 LAB — PACEMAKER DEVICE OBSERVATION
BATTERY VOLTAGE: 2.947 V
RV LEAD IMPEDENCE PM: 477 Ohm
RV LEAD THRESHOLD: 1 V

## 2011-07-25 LAB — POCT INR: INR: 3

## 2011-07-25 NOTE — Progress Notes (Signed)
PPM check 

## 2011-09-03 ENCOUNTER — Telehealth: Payer: Self-pay | Admitting: Internal Medicine

## 2011-09-03 MED ORDER — AZITHROMYCIN 250 MG PO TABS: ORAL_TABLET | ORAL | Status: AC

## 2011-09-03 NOTE — Telephone Encounter (Signed)
Per CY-okay to give Zpak #1 take as directed no refills. Pt aware that Rx sent. Will call back if no better.

## 2011-09-03 NOTE — Telephone Encounter (Signed)
Encounter closed in error, will forward msg to CDY

## 2011-09-03 NOTE — Telephone Encounter (Signed)
Spoke with pt. She c/o loss of appetite, vomiting, increased SOB, chest tightness and prod cough with large amounts of sputum- ? Color she states that she just swallows it. No fever or other complaints. Please advise recs thanks! Allergies  Allergen Reactions  . Warfarin Sodium     REACTION: hives, (can take coumadin)

## 2011-09-09 ENCOUNTER — Ambulatory Visit (INDEPENDENT_AMBULATORY_CARE_PROVIDER_SITE_OTHER): Payer: Medicare Other | Admitting: *Deleted

## 2011-09-09 DIAGNOSIS — I4891 Unspecified atrial fibrillation: Secondary | ICD-10-CM

## 2011-09-09 LAB — POCT INR: INR: 3.7

## 2011-09-11 ENCOUNTER — Other Ambulatory Visit: Payer: Self-pay | Admitting: Internal Medicine

## 2011-09-11 ENCOUNTER — Other Ambulatory Visit: Payer: Self-pay

## 2011-09-12 ENCOUNTER — Other Ambulatory Visit: Payer: Self-pay | Admitting: Internal Medicine

## 2011-10-02 ENCOUNTER — Ambulatory Visit (INDEPENDENT_AMBULATORY_CARE_PROVIDER_SITE_OTHER): Payer: Medicare Other | Admitting: *Deleted

## 2011-10-02 DIAGNOSIS — I4891 Unspecified atrial fibrillation: Secondary | ICD-10-CM

## 2011-10-02 LAB — POCT INR: INR: 2.7

## 2011-10-30 ENCOUNTER — Ambulatory Visit (INDEPENDENT_AMBULATORY_CARE_PROVIDER_SITE_OTHER): Payer: Medicare Other | Admitting: *Deleted

## 2011-10-30 DIAGNOSIS — I4891 Unspecified atrial fibrillation: Secondary | ICD-10-CM

## 2011-11-07 ENCOUNTER — Other Ambulatory Visit: Payer: Self-pay | Admitting: Internal Medicine

## 2011-11-26 ENCOUNTER — Other Ambulatory Visit: Payer: Self-pay | Admitting: Internal Medicine

## 2011-11-27 ENCOUNTER — Ambulatory Visit (INDEPENDENT_AMBULATORY_CARE_PROVIDER_SITE_OTHER): Payer: Medicare Other | Admitting: Pharmacist

## 2011-11-27 DIAGNOSIS — I4891 Unspecified atrial fibrillation: Secondary | ICD-10-CM

## 2012-01-01 ENCOUNTER — Ambulatory Visit (INDEPENDENT_AMBULATORY_CARE_PROVIDER_SITE_OTHER): Payer: Medicare Other | Admitting: *Deleted

## 2012-01-01 DIAGNOSIS — I4891 Unspecified atrial fibrillation: Secondary | ICD-10-CM

## 2012-01-11 ENCOUNTER — Other Ambulatory Visit: Payer: Self-pay | Admitting: Internal Medicine

## 2012-01-20 ENCOUNTER — Ambulatory Visit: Payer: Medicare Other | Admitting: Internal Medicine

## 2012-01-29 ENCOUNTER — Ambulatory Visit (INDEPENDENT_AMBULATORY_CARE_PROVIDER_SITE_OTHER): Payer: Medicare Other | Admitting: Pharmacist

## 2012-01-29 DIAGNOSIS — I4891 Unspecified atrial fibrillation: Secondary | ICD-10-CM

## 2012-01-29 LAB — POCT INR: INR: 2.8

## 2012-02-05 ENCOUNTER — Encounter: Payer: Self-pay | Admitting: Internal Medicine

## 2012-02-24 ENCOUNTER — Ambulatory Visit (INDEPENDENT_AMBULATORY_CARE_PROVIDER_SITE_OTHER): Payer: Medicare Other | Admitting: Internal Medicine

## 2012-02-24 ENCOUNTER — Ambulatory Visit (INDEPENDENT_AMBULATORY_CARE_PROVIDER_SITE_OTHER): Payer: Medicare Other | Admitting: Pharmacist

## 2012-02-24 ENCOUNTER — Encounter: Payer: Self-pay | Admitting: Internal Medicine

## 2012-02-24 VITALS — BP 92/54 | HR 72 | Ht 65.0 in | Wt 147.2 lb

## 2012-02-24 DIAGNOSIS — I4891 Unspecified atrial fibrillation: Secondary | ICD-10-CM

## 2012-02-24 DIAGNOSIS — I5032 Chronic diastolic (congestive) heart failure: Secondary | ICD-10-CM

## 2012-02-24 DIAGNOSIS — J069 Acute upper respiratory infection, unspecified: Secondary | ICD-10-CM

## 2012-02-24 DIAGNOSIS — Z45018 Encounter for adjustment and management of other part of cardiac pacemaker: Secondary | ICD-10-CM

## 2012-02-24 LAB — PACEMAKER DEVICE OBSERVATION
LV LEAD THRESHOLD: 1.5 V
VENTRICULAR PACING PM: 100

## 2012-02-24 NOTE — Assessment & Plan Note (Signed)
Her atrial fibrillation is now chronic. She will continue her anticoagulation for thromboembolic prevention.

## 2012-02-24 NOTE — Progress Notes (Signed)
HPI [name] returns today for followup. She is a 78 year old woman with atrial fibrillation and a rapid ventricular response. She is status post AV node ablation and biventricular pacemaker insertion nearly 6 years ago. Since then she has remained out of the hospital. She does admit to a sedentary lifestyle and has had gradual leg weakness over the last several months. No syncope. No peripheral edema. No Known Allergies   Current Outpatient Prescriptions  Medication Sig Dispense Refill  . escitalopram (LEXAPRO) 10 MG tablet Take 10 mg by mouth daily.        . hydrochlorothiazide (HYDRODIURIL) 25 MG tablet TAKE 1/2 TABLET EVERY DAY  45 tablet  2  . KLOR-CON 10 10 MEQ tablet TAKE 1 TABLET BY MOUTH EVERY DAY  30 tablet  11  . PROAIR HFA 108 (90 BASE) MCG/ACT inhaler TAKE 2 PUFFS EVERY 4-6 HRS AS NEEDED  1 Inhaler  2  . SPIRIVA HANDIHALER 18 MCG inhalation capsule INHALE CONTENT OF 1 CAPSULE EVERY DAY AS DIRECTED  30 each  2  . warfarin (COUMADIN) 5 MG tablet TAKE AS DIRECTED BY ANTICOAGULATION CLINIC  30 tablet  3     Past Medical History  Diagnosis Date  . Tobacco abuse   . Paroxysmal atrial fibrillation     AICD/pacemaker  . Pulmonary nodule   . COPD (chronic obstructive pulmonary disease)     Spirometry 06/10/05, FEV1 46% and ratio 51% with 14% better after albuterol    ROS:   All systems reviewed and negative except as noted in the HPI.   Past Surgical History  Procedure Date  . Pacemaker insertion   . Soft tissue tumor resection     From right forearm  . Skin graft      Family History  Problem Relation Age of Onset  . Other Neg Hx     Respiratory disease     History   Social History  . Marital Status: Divorced    Spouse Name: N/A    Number of Children: N/A  . 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 Smoking status: Current Everyday Smoker -- 1.0 packs/day for 50 years  . Smokeless  tobacco: Never Used   Comment: Counseling, down to less than half ppd as of 03/04/11  . Alcohol Use: No  . Drug Use: Not on file  . Sexually Active: Not on file   Other Topics Concern  . Not on file   Social History Narrative   Divorced1 daughter     BP 92/54  Pulse 72  Ht 5\' 5"  (1.651 m)  Wt 147 lb 2.4 oz (66.747 kg)  BMI 24.49 kg/m2  Physical Exam:  Well appearing 78 year old woman, NAD HEENT: Unremarkable Neck:  No JVD, no thyromegally Lungs:  Clear with no wheezes, rales, or rhonchi. HEART:  Regular rate rhythm, no murmurs, no rubs, no clicks Abd:  soft, positive bowel sounds, no organomegally, no rebound, no guarding Ext:  2 plus pulses, no edema, no cyanosis, no clubbing Skin:  No rashes no nodules Neuro:  CN II through XII intact, motor grossly intact  DEVICE  Normal device function.  See PaceArt for details.   Assess/Plan:

## 2012-02-24 NOTE — Assessment & Plan Note (Signed)
Her device is working normally. She has a Medtronic biventricular pacemaker. She is approximately 2 years from elective replacement.

## 2012-02-24 NOTE — Patient Instructions (Addendum)
Your physician wants you to follow-up in: 12 months with Dr. Taylor. You will receive a reminder letter in the mail two months in advance. If you don't receive a letter, please call our office to schedule the follow-up appointment.    

## 2012-02-24 NOTE — Assessment & Plan Note (Signed)
Her heart failure symptoms remain class II. She will continue her current medical therapy and maintain a low-sodium diet. I've recommended that she start exercising by walking.

## 2012-04-06 ENCOUNTER — Ambulatory Visit (INDEPENDENT_AMBULATORY_CARE_PROVIDER_SITE_OTHER): Payer: Medicare Other | Admitting: Pharmacist

## 2012-04-06 DIAGNOSIS — I4891 Unspecified atrial fibrillation: Secondary | ICD-10-CM

## 2012-04-14 ENCOUNTER — Other Ambulatory Visit: Payer: Self-pay | Admitting: Internal Medicine

## 2012-04-20 ENCOUNTER — Encounter: Payer: Self-pay | Admitting: Internal Medicine

## 2012-04-20 ENCOUNTER — Ambulatory Visit (INDEPENDENT_AMBULATORY_CARE_PROVIDER_SITE_OTHER): Payer: Medicare Other | Admitting: Internal Medicine

## 2012-04-20 VITALS — BP 122/72 | HR 82 | Ht 64.0 in | Wt 148.2 lb

## 2012-04-20 DIAGNOSIS — J984 Other disorders of lung: Secondary | ICD-10-CM

## 2012-04-20 DIAGNOSIS — F17200 Nicotine dependence, unspecified, uncomplicated: Secondary | ICD-10-CM

## 2012-04-20 DIAGNOSIS — J449 Chronic obstructive pulmonary disease, unspecified: Secondary | ICD-10-CM

## 2012-04-20 DIAGNOSIS — Z23 Encounter for immunization: Secondary | ICD-10-CM

## 2012-04-20 NOTE — Patient Instructions (Addendum)
Flu vax  Please seriously think about trying to quit smoking. The patches and electronic cigarettes are helping people who want to quit.  Please call as needed.

## 2012-04-20 NOTE — Progress Notes (Signed)
Subjective:    Patient ID: [name], female    DOB: [birth year], 78 y.o.   MRN: [id number]  HPI 03/04/11- 30 yo F smoker followed for tobacco abuse, COPD, hx lung nodule, complicated by PAFib, chronic diastolic heart failure/ pacemaker/ coumadin Last here September 03, 2010- Still smoking about 1/2 ppd, feels she is doing better and we kept the issue up and active, reminding again of our many conversations about this.  Spiriva really helps wheeze with minor dry mouth.  CXR- 09/03/10 COPD, ICD, but no new or active process. Has had more than one Pneumovax- I told her that was probably sufficient. She commented on her tendency to wait too long before seeking help. Gets prednisone about once/ year.   07/22/11- 25 yo F smoker followed for tobacco abuse, COPD, hx lung nodule, complicated by PAFib, chronic diastolic heart failure/ pacemaker/ coumadin  She is pleased to report feeling extremely well at this time.  She had flu vaccine. Has not had a significant respiratory illness this winter.  Still smoking just under 1 pack per day. I talked with her again about smoking cessation options and motivation.  She likes Spiriva.  *RADIOLOGY REPORT* 08/2010 Clinical Data: Shortness of breath and wheezing. Smoker.  CHEST - 2 VIEW  Comparison: 09/04/2009  Findings: There is a left chest wall ICD with leads in the right  atrial appendage and right ventricle.  The heart size is normal.  No pleural effusions or pulmonary edema.  Increased lung volumes.  There are coarsened interstitial markings suggestive of COPD.  No airspace consolidation.  Review of the visualized osseous structures is unremarkable.  IMPRESSION:  1. No acute cardiopulmonary abnormalities.  Original Report Authenticated By: Rosealee Albee, M.D.   04/20/12- 51 yo F smoker followed for tobacco abuse, COPD, hx lung nodule, complicated by PAFib, chronic diastolic heart failure/ pacemaker/ coumadin SOB with acitivity-not at rest; leg  weakness She considers her breathing at baseline with usual dyspnea on exertion and leg weakness but nothing new. COPD assessment test (CABG) score 18/40 CXR 01/21/12- Dr Clelia Croft- COPD, nothing new.  Review of Systems- see HPI Constitutional:   No-   weight loss, night sweats, fevers, chills, fatigue, lassitude. HEENT:   No-  headaches, difficulty swallowing, tooth/dental problems, sore throat,       No-  sneezing, itching, ear ache, nasal congestion, post nasal drip,  CV:  No-   chest pain, orthopnea, PND, swelling in lower extremities, anasarca, dizziness, palpitations Resp: No- acute  shortness of breath with exertion or at rest.              Little   productive cough,  No non-productive cough,  No-  coughing up of blood.              No-   change in color of mucus.  No- wheezing.   Skin: No-   rash or lesions. GI:  No-   heartburn, indigestion, abdominal pain, nausea, vomiting,  GU: MS:  No-   joint pain or swelling.   Neuro- nothing new Psych:  No- change in mood or affect. No depression or anxiety.  No memory loss.    Objective:   Physical Exam General- Alert, Oriented, Affect-appropriate, Distress- none acute, bright and cheerfull affect today Skin- rash-none, lesions- none, excoriation- none Lymphadenopathy- none Head- atraumatic            Eyes- Gross vision intact, PERRLA, conjunctivae clear secretions. R eye- chronic droop.  Ears- Hearing, canals normal             Nose- Clear, No-Septal dev, mucus, polyps, erosion, perforation             Throat- Mallampati II , mucosa clear , drainage- none, tonsils- atrophic Neck- flexible , trachea midline, no stridor , thyroid nl, carotid no bruit Chest - symmetrical excursion , unlabored           Heart/CV- RRR( paced) , no murmur , no gallop  , no rub, nl s1 s2                           - JVD- none , edema- none, stasis changes- none, varices- none           Lung- + crackles in bases, distant, + expiratory wheeze in upper  chest, cough- none , dullness-none, rub- none               Chest wall-  Abd-  Br/ Gen/ Rectal- Not done, not indicated Extrem- cyanosis- none, clubbing, none, atrophy- none, strength- nl Neuro- grossly intact to observation

## 2012-04-21 DIAGNOSIS — Z23 Encounter for immunization: Secondary | ICD-10-CM

## 2012-04-26 NOTE — Assessment & Plan Note (Signed)
Continued surveillance is appropriate

## 2012-04-26 NOTE — Assessment & Plan Note (Signed)
She still smokes and is frank about making no effort despite our conversations. I spoke with her about medical consequences and available support.

## 2012-04-26 NOTE — Assessment & Plan Note (Signed)
She has a long smoking history and she knows she has COPD. Plan-smoking cessation emphasized. Flu shot

## 2012-05-20 ENCOUNTER — Ambulatory Visit (INDEPENDENT_AMBULATORY_CARE_PROVIDER_SITE_OTHER): Payer: Medicare Other | Admitting: Pharmacist

## 2012-05-20 DIAGNOSIS — I4891 Unspecified atrial fibrillation: Secondary | ICD-10-CM

## 2012-06-09 ENCOUNTER — Other Ambulatory Visit: Payer: Self-pay | Admitting: Internal Medicine

## 2012-07-01 ENCOUNTER — Ambulatory Visit (INDEPENDENT_AMBULATORY_CARE_PROVIDER_SITE_OTHER): Payer: Medicare Other | Admitting: Pharmacist

## 2012-07-01 DIAGNOSIS — I4891 Unspecified atrial fibrillation: Secondary | ICD-10-CM

## 2012-07-01 LAB — POCT INR: INR: 2.3

## 2012-08-14 ENCOUNTER — Ambulatory Visit (INDEPENDENT_AMBULATORY_CARE_PROVIDER_SITE_OTHER): Payer: Medicare Other | Admitting: *Deleted

## 2012-08-14 DIAGNOSIS — I4891 Unspecified atrial fibrillation: Secondary | ICD-10-CM

## 2012-08-14 LAB — POCT INR: INR: 3

## 2012-09-07 ENCOUNTER — Ambulatory Visit (INDEPENDENT_AMBULATORY_CARE_PROVIDER_SITE_OTHER): Payer: Medicare Other | Admitting: *Deleted

## 2012-09-07 ENCOUNTER — Other Ambulatory Visit: Payer: Self-pay | Admitting: Internal Medicine

## 2012-09-07 DIAGNOSIS — I4891 Unspecified atrial fibrillation: Secondary | ICD-10-CM

## 2012-09-07 DIAGNOSIS — Z45018 Encounter for adjustment and management of other part of cardiac pacemaker: Secondary | ICD-10-CM

## 2012-09-07 LAB — PACEMAKER DEVICE OBSERVATION
BATTERY VOLTAGE: 2.906 V
LV LEAD THRESHOLD: 1.5 V
RV LEAD IMPEDENCE PM: 518 Ohm
RV LEAD THRESHOLD: 1 V

## 2012-09-07 NOTE — Progress Notes (Signed)
Pacer check in clinic  

## 2012-09-22 ENCOUNTER — Encounter: Payer: Self-pay | Admitting: Internal Medicine

## 2012-09-24 ENCOUNTER — Encounter: Payer: Self-pay | Admitting: Internal Medicine

## 2012-09-25 ENCOUNTER — Ambulatory Visit (INDEPENDENT_AMBULATORY_CARE_PROVIDER_SITE_OTHER): Payer: Medicare Other | Admitting: *Deleted

## 2012-09-25 DIAGNOSIS — I4891 Unspecified atrial fibrillation: Secondary | ICD-10-CM

## 2012-09-25 LAB — POCT INR: INR: 2.2

## 2012-10-07 ENCOUNTER — Telehealth: Payer: Self-pay | Admitting: Internal Medicine

## 2012-10-07 MED ORDER — WARFARIN SODIUM 5 MG PO TABS: ORAL_TABLET | ORAL | Status: DC

## 2012-10-07 NOTE — Telephone Encounter (Signed)
Rx sent.  Unable to reach pt to inform her.

## 2012-10-07 NOTE — Telephone Encounter (Signed)
New Problem:     Patient called in needing a refill of her warfarin (COUMADIN) 5 MG tablet.  Patient is out of her medication.

## 2012-10-19 ENCOUNTER — Ambulatory Visit: Payer: Medicare Other | Admitting: Internal Medicine

## 2012-11-06 ENCOUNTER — Ambulatory Visit (INDEPENDENT_AMBULATORY_CARE_PROVIDER_SITE_OTHER): Payer: Medicare Other | Admitting: *Deleted

## 2012-11-06 DIAGNOSIS — I4891 Unspecified atrial fibrillation: Secondary | ICD-10-CM

## 2012-11-06 LAB — POCT INR: INR: 2.8

## 2012-11-19 ENCOUNTER — Other Ambulatory Visit: Payer: Self-pay | Admitting: *Deleted

## 2012-11-19 MED ORDER — WARFARIN SODIUM 5 MG PO TABS: ORAL_TABLET | ORAL | Status: DC

## 2012-12-06 ENCOUNTER — Other Ambulatory Visit: Payer: Self-pay | Admitting: Internal Medicine

## 2012-12-14 ENCOUNTER — Ambulatory Visit (INDEPENDENT_AMBULATORY_CARE_PROVIDER_SITE_OTHER)
Admission: RE | Admit: 2012-12-14 | Discharge: 2012-12-14 | Disposition: A | Discharge status: Home / Self Care | Payer: Medicare Other | Source: Ambulatory Visit | Attending: Internal Medicine | Admitting: Internal Medicine

## 2012-12-14 ENCOUNTER — Ambulatory Visit (INDEPENDENT_AMBULATORY_CARE_PROVIDER_SITE_OTHER): Payer: Medicare Other | Admitting: Internal Medicine

## 2012-12-14 ENCOUNTER — Encounter: Payer: Self-pay | Admitting: Internal Medicine

## 2012-12-14 VITALS — BP 110/76 | HR 82 | Ht 64.0 in | Wt 141.2 lb

## 2012-12-14 DIAGNOSIS — F17200 Nicotine dependence, unspecified, uncomplicated: Secondary | ICD-10-CM

## 2012-12-14 DIAGNOSIS — J449 Chronic obstructive pulmonary disease, unspecified: Secondary | ICD-10-CM

## 2012-12-14 DIAGNOSIS — J438 Other emphysema: Secondary | ICD-10-CM

## 2012-12-14 DIAGNOSIS — J4489 Other specified chronic obstructive pulmonary disease: Secondary | ICD-10-CM

## 2012-12-14 DIAGNOSIS — J439 Emphysema, unspecified: Secondary | ICD-10-CM

## 2012-12-14 MED ORDER — TIOTROPIUM BROMIDE MONOHYDRATE 18 MCG IN CAPS: 18.0000 ug | ORAL_CAPSULE | Freq: Every day | RESPIRATORY_TRACT | Status: DC

## 2012-12-14 MED ORDER — ALBUTEROL SULFATE HFA 108 (90 BASE) MCG/ACT IN AERS: 2.0000 | INHALATION_SPRAY | Freq: Four times a day (QID) | RESPIRATORY_TRACT | Status: DC | PRN

## 2012-12-14 NOTE — Progress Notes (Signed)
Subjective:    Patient ID: [name], female    DOB: 1931-03-23, 79 y.o.   MRN: [id number]  HPI 03/04/11- 28 yo F smoker followed for tobacco abuse, COPD, hx lung nodule, complicated by PAFib, chronic diastolic heart failure/ pacemaker/ coumadin Last here September 03, 2010- Still smoking about 1/2 ppd, feels she is doing better and we kept the issue up and active, reminding again of our many conversations about this.  Spiriva really helps wheeze with minor dry mouth.  CXR- 09/03/10 COPD, ICD, but no new or active process. Has had more than one Pneumovax- I told her that was probably sufficient. She commented on her tendency to wait too long before seeking help. Gets prednisone about once/ year.   07/22/11- 34 yo F smoker followed for tobacco abuse, COPD, hx lung nodule, complicated by PAFib, chronic diastolic heart failure/ pacemaker/ coumadin  She is pleased to report feeling extremely well at this time.  She had flu vaccine. Has not had a significant respiratory illness this winter.  Still smoking just under 1 pack per day. I talked with her again about smoking cessation options and motivation.  She likes Spiriva.  *RADIOLOGY REPORT* 08/2010 Clinical Data: Shortness of breath and wheezing. Smoker.  CHEST - 2 VIEW  Comparison: 09/04/2009  Findings: There is a left chest wall ICD with leads in the right  atrial appendage and right ventricle.  The heart size is normal.  No pleural effusions or pulmonary edema.  Increased lung volumes.  There are coarsened interstitial markings suggestive of COPD.  No airspace consolidation.  Review of the visualized osseous structures is unremarkable.  IMPRESSION:  1. No acute cardiopulmonary abnormalities.  Original Report Authenticated By: Rosealee Albee, M.D.   04/20/12- 46 yo F smoker followed for tobacco abuse, COPD, hx lung nodule, complicated by PAFib, chronic diastolic heart failure/ pacemaker/ coumadin SOB with acitivity-not at rest; leg  weakness She considers her breathing at baseline with usual dyspnea on exertion and leg weakness but nothing new. COPD assessment test (CABG) score 18/40 CXR 01/21/12- Dr Clelia Croft- COPD, nothing new.  12/14/12- 54 yo F smoker followed for tobacco abuse, COPD, hx lung nodule, complicated by PAFib, chronic diastolic heart failure/ pacemaker-ICD/ coumadin FOLLOWS FOR: Continutes to have SOB with activity-leg weakness as well; denies any wheezing, cough, or congestion. Still smokes with no effort to stop- discussed again. Very good winter, no colds. No acute events. Discussed meds.  Review of Systems- see HPI Constitutional:   No-   weight loss, night sweats, fevers, chills, fatigue, lassitude. HEENT:   No-  headaches, difficulty swallowing, tooth/dental problems, sore throat,       No-  sneezing, itching, ear ache, nasal congestion, post nasal drip,  CV:  No-   chest pain, orthopnea, PND, swelling in lower extremities, anasarca, dizziness, palpitations Resp: No- acute  shortness of breath with exertion or at rest.              No- productive cough,  No non-productive cough,  No-  coughing up of blood.              No-   change in color of mucus.  No- wheezing.   Skin: Cutaneous horns she picks at. I suggested she seem dermatologist. GI:  No-   heartburn, indigestion, abdominal pain, nausea, vomiting,  GU: MS:  No-   joint pain or swelling.   Neuro- nothing new Psych:  No- change in mood or affect. No depression or anxiety.  No  memory loss.    Objective:   Physical Exam General- Alert, Oriented, Affect-appropriate, Distress- none acute, bright and cheerfull affect today Skin- Actinic keratoses, picked -at. Lymphadenopathy- none Head- atraumatic            Eyes- Gross vision intact, PERRLA, conjunctivae clear secretions. R eye- chronic droop.            Ears- Hearing, canals normal             Nose- Clear, No-Septal dev, mucus, polyps, erosion, perforation             Throat- Mallampati II ,  mucosa clear , drainage- none, tonsils- atrophic Neck- flexible , trachea midline, no stridor , thyroid nl, carotid no bruit Chest - symmetrical excursion , unlabored           Heart/CV- RRR( paced) , no murmur , no gallop  , no rub, nl s1 s2                           - JVD- none , edema- none, stasis changes- none, varices- none           Lung- clear,very distant, unlabored, cough- none , dullness-none, rub- none               Chest wall- L ICD/ pacemaker Abd-  Br/ Gen/ Rectal- Not done, not indicated Extrem- cyanosis- none, clubbing, none, atrophy- none, strength- nl Neuro- grossly intact to observation

## 2012-12-14 NOTE — Patient Instructions (Addendum)
Med refills   Order- CXR   Dx COPD  Please keep trying to stop smoking !  Please call as needed

## 2012-12-18 ENCOUNTER — Ambulatory Visit (INDEPENDENT_AMBULATORY_CARE_PROVIDER_SITE_OTHER): Payer: Medicare Other | Admitting: *Deleted

## 2012-12-18 DIAGNOSIS — I4891 Unspecified atrial fibrillation: Secondary | ICD-10-CM

## 2012-12-18 LAB — POCT INR: INR: 1.7

## 2012-12-25 NOTE — Assessment & Plan Note (Signed)
We talked for 10 or 15 minutes about the importance of smoking cessation and available support to help her quit. I get no sense that she is motivated to try.

## 2012-12-25 NOTE — Assessment & Plan Note (Signed)
At least moderate COPD, better controlled. Emphasis on smoking cessation.

## 2013-01-11 ENCOUNTER — Ambulatory Visit (INDEPENDENT_AMBULATORY_CARE_PROVIDER_SITE_OTHER): Payer: Medicare Other | Admitting: *Deleted

## 2013-01-11 DIAGNOSIS — I4891 Unspecified atrial fibrillation: Secondary | ICD-10-CM

## 2013-01-26 ENCOUNTER — Other Ambulatory Visit: Payer: Self-pay | Admitting: Internal Medicine

## 2013-02-16 ENCOUNTER — Ambulatory Visit (INDEPENDENT_AMBULATORY_CARE_PROVIDER_SITE_OTHER): Payer: Medicare Other | Admitting: *Deleted

## 2013-02-16 DIAGNOSIS — I4891 Unspecified atrial fibrillation: Secondary | ICD-10-CM

## 2013-03-03 ENCOUNTER — Encounter: Payer: Self-pay | Admitting: Internal Medicine

## 2013-03-03 ENCOUNTER — Ambulatory Visit (INDEPENDENT_AMBULATORY_CARE_PROVIDER_SITE_OTHER): Payer: Medicare Other | Admitting: *Deleted

## 2013-03-03 ENCOUNTER — Ambulatory Visit (INDEPENDENT_AMBULATORY_CARE_PROVIDER_SITE_OTHER): Payer: Medicare Other | Admitting: Internal Medicine

## 2013-03-03 VITALS — BP 118/0 | HR 72 | Ht 64.0 in | Wt 138.8 lb

## 2013-03-03 DIAGNOSIS — Z45018 Encounter for adjustment and management of other part of cardiac pacemaker: Secondary | ICD-10-CM

## 2013-03-03 DIAGNOSIS — I4891 Unspecified atrial fibrillation: Secondary | ICD-10-CM

## 2013-03-03 LAB — PACEMAKER DEVICE OBSERVATION
BATTERY VOLTAGE: 2.86 V
RV LEAD IMPEDENCE PM: 518 Ohm

## 2013-03-03 LAB — POCT INR: INR: 2.1

## 2013-03-03 NOTE — Progress Notes (Signed)
HPI [name] returns today for followup. She is a pleasant 79 year old woman with complete heart block, and chronic atrial fibrillation, and congestive heart failure, status post biventricular pacemaker insertion. In the interim, she has been stable. She denies chest pain, shortness of breath except with exertion, or syncope. She is approximately one year out from elective replacement. The patient denies chest pain. She has class II heart failure symptoms.  She has minimal peripheral edema. No Known Allergies   Current Outpatient Prescriptions  Medication Sig Dispense Refill  . albuterol (PROAIR HFA) 108 (90 BASE) MCG/ACT inhaler Inhale 2 puffs into the lungs 4 (four) times daily as needed for wheezing.  8.5 each  11  . escitalopram (LEXAPRO) 10 MG tablet Take 10 mg by mouth daily.        . hydrochlorothiazide (HYDRODIURIL) 25 MG tablet TAKE 1/2 TABLET EVERY DAY  45 tablet  3  . potassium chloride (KLOR-CON 10) 10 MEQ tablet Take 1 tab daily  30 tablet  2  . tiotropium (SPIRIVA HANDIHALER) 18 MCG inhalation capsule Place 1 capsule (18 mcg total) into inhaler and inhale daily.  30 capsule  11  . warfarin (COUMADIN) 5 MG tablet TAKE AS DIRECTED BY ANTICOAGULATION CLINIC  30 tablet  3   No current facility-administered medications for this visit.     Past Medical History  Diagnosis Date  . Tobacco abuse   . Paroxysmal atrial fibrillation     AICD/pacemaker  . Pulmonary nodule   . COPD (chronic obstructive pulmonary disease)     Spirometry 06/10/05, FEV1 46% and ratio 51% with 14% better after albuterol    ROS:   All systems reviewed and negative except as noted in the HPI.   Past Surgical History  Procedure Laterality Date  . Pacemaker insertion    . Soft tissue tumor resection      From right forearm  . Skin graft       Family History  Problem Relation Age of Onset  . Other Neg Hx     Respiratory disease     History   Social History  . Marital Status: Divorced    Spouse Name: N/A    Number of Children: N/A  . 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 Smoking status: Current Every Day Smoker -- 1.00 packs/day for 50 years  . Smokeless tobacco: Never Used     Comment: Counseling, down to less than half ppd as of 03/04/11  . Alcohol Use: No  . Drug Use: Not on file  . Sexual Activity: Not on file   Other Topics Concern  . Not on file   Social History Narrative   Divorced   1 daughter     BP 118/0  Pulse 72  Ht 5\' 4"  (1.626 m)  Wt 138 lb 12.8 oz (62.959 kg)  BMI 23.81 kg/m2  Physical Exam:  elderly appearing 79 year old woman, NAD HEENT: Unremarkable Neck:  7 cm JVD, no thyromegally Lungs:  Clear with no wheezes, rales, or rhonchi. Decreased breath sounds. HEART:  Regular rate rhythm, no murmurs, no rubs, no clicks Abd:  soft, positive bowel sounds, no organomegally, no rebound, no guarding Ext:  2 plus pulses, no edema, no cyanosis, no clubbing Skin:  No rashes no nodules Neuro:  CN II through XII intact, motor grossly intact  EKG - atrial fibrillation with ventricular pacing  DEVICE  Normal device function.  See PaceArt  for details. She is approaching elective replacement  Assess/Plan:

## 2013-03-03 NOTE — Assessment & Plan Note (Signed)
Her Medtronic biventricular pacemaker is working satisfactorily. We'll recheck in several months.

## 2013-03-03 NOTE — Assessment & Plan Note (Signed)
Her atrial fibrillation is now permanent. Her ventricular rate is well controlled. She will continue her current dose of anticoagulation.

## 2013-03-03 NOTE — Patient Instructions (Addendum)
Your physician recommends that you schedule a follow-up appointment in: 3 months as scheduled in device clinic and 12 months with Dr Ladona Ridgel

## 2013-04-02 ENCOUNTER — Ambulatory Visit (INDEPENDENT_AMBULATORY_CARE_PROVIDER_SITE_OTHER): Payer: Medicare Other | Admitting: *Deleted

## 2013-04-02 DIAGNOSIS — I4891 Unspecified atrial fibrillation: Secondary | ICD-10-CM

## 2013-04-09 ENCOUNTER — Ambulatory Visit (INDEPENDENT_AMBULATORY_CARE_PROVIDER_SITE_OTHER): Payer: Medicare Other

## 2013-04-09 ENCOUNTER — Telehealth: Payer: Self-pay | Admitting: Internal Medicine

## 2013-04-09 DIAGNOSIS — Z23 Encounter for immunization: Secondary | ICD-10-CM

## 2013-04-09 NOTE — Telephone Encounter (Signed)
These have been added to pt chart.

## 2013-04-30 ENCOUNTER — Ambulatory Visit (INDEPENDENT_AMBULATORY_CARE_PROVIDER_SITE_OTHER): Payer: Medicare Other | Admitting: Pharmacist

## 2013-04-30 DIAGNOSIS — I4891 Unspecified atrial fibrillation: Secondary | ICD-10-CM

## 2013-04-30 LAB — POCT INR: INR: 2.5

## 2013-06-03 ENCOUNTER — Ambulatory Visit (INDEPENDENT_AMBULATORY_CARE_PROVIDER_SITE_OTHER): Payer: Medicare Other | Admitting: *Deleted

## 2013-06-03 DIAGNOSIS — I4891 Unspecified atrial fibrillation: Secondary | ICD-10-CM

## 2013-06-03 DIAGNOSIS — I442 Atrioventricular block, complete: Secondary | ICD-10-CM

## 2013-06-03 LAB — MDC_IDC_ENUM_SESS_TYPE_INCLINIC
Battery Remaining Longevity: 7 mo
Battery Voltage: 2.82 V
Brady Statistic RV Percent Paced: 100 %
Implantable Pulse Generator Model: 8042
Lead Channel Setting Pacing Amplitude: 2.5 V
Lead Channel Setting Pacing Pulse Width: 0.4 ms
Lead Channel Setting Sensing Sensitivity: 2.8 mV

## 2013-06-03 NOTE — Progress Notes (Signed)
Pacemaker check only---battery longevity 7 months. ROV 07-16-13 same day as PT check.

## 2013-06-17 ENCOUNTER — Ambulatory Visit: Payer: Medicare Other | Admitting: Internal Medicine

## 2013-07-05 ENCOUNTER — Encounter: Payer: Self-pay | Admitting: Internal Medicine

## 2013-07-16 ENCOUNTER — Ambulatory Visit (INDEPENDENT_AMBULATORY_CARE_PROVIDER_SITE_OTHER): Payer: Medicare Other | Admitting: Pharmacist

## 2013-07-16 ENCOUNTER — Ambulatory Visit (INDEPENDENT_AMBULATORY_CARE_PROVIDER_SITE_OTHER): Payer: Medicare Other | Admitting: *Deleted

## 2013-07-16 DIAGNOSIS — I4891 Unspecified atrial fibrillation: Secondary | ICD-10-CM

## 2013-07-16 DIAGNOSIS — I442 Atrioventricular block, complete: Secondary | ICD-10-CM

## 2013-07-16 LAB — MDC_IDC_ENUM_SESS_TYPE_INCLINIC
Battery Remaining Longevity: 9 mo
Battery Voltage: 2.84 V
Brady Statistic RV Percent Paced: 100 %
Lead Channel Setting Pacing Amplitude: 2.5 V
Lead Channel Setting Pacing Amplitude: 2.5 V
Lead Channel Setting Pacing Pulse Width: 0.4 ms
Lead Channel Setting Pacing Pulse Width: 0.5 ms
Lead Channel Setting Sensing Sensitivity: 2.8 mV
MDC IDC MSMT LEADCHNL LV IMPEDANCE VALUE: 1008 Ohm
MDC IDC MSMT LEADCHNL RA IMPEDANCE VALUE: 0 Ohm
MDC IDC MSMT LEADCHNL RV IMPEDANCE VALUE: 538 Ohm
MDC IDC SESS DTM: 20150102121024

## 2013-07-16 LAB — POCT INR: INR: 2.5

## 2013-07-16 NOTE — Progress Notes (Signed)
Pacemaker check in clinic. Battery longevity 9 months with range of 7 to 10 months. Normal device function. Pt scheduled for battery check on 08-26-13 @ 1200 (Coumadin check at 1145).

## 2013-08-10 ENCOUNTER — Other Ambulatory Visit: Payer: Self-pay | Admitting: Internal Medicine

## 2013-08-10 ENCOUNTER — Encounter: Payer: Self-pay | Admitting: Internal Medicine

## 2013-08-26 ENCOUNTER — Ambulatory Visit (INDEPENDENT_AMBULATORY_CARE_PROVIDER_SITE_OTHER): Payer: Medicare Other | Admitting: *Deleted

## 2013-08-26 ENCOUNTER — Ambulatory Visit (INDEPENDENT_AMBULATORY_CARE_PROVIDER_SITE_OTHER): Payer: Medicare Other | Admitting: Pharmacist

## 2013-08-26 DIAGNOSIS — I4891 Unspecified atrial fibrillation: Secondary | ICD-10-CM

## 2013-08-26 LAB — MDC_IDC_ENUM_SESS_TYPE_INCLINIC

## 2013-08-26 LAB — POCT INR: INR: 2.6

## 2013-08-26 NOTE — Progress Notes (Signed)
Interrogation only for battery longevity.  Estimated longevity 7-10 months with an average of 9 months.  ROV in 6 weeks.

## 2013-08-28 ENCOUNTER — Other Ambulatory Visit: Payer: Self-pay | Admitting: Internal Medicine

## 2013-09-14 ENCOUNTER — Encounter: Payer: Self-pay | Admitting: Internal Medicine

## 2013-10-08 ENCOUNTER — Ambulatory Visit (INDEPENDENT_AMBULATORY_CARE_PROVIDER_SITE_OTHER): Payer: Medicare Other | Admitting: *Deleted

## 2013-10-08 DIAGNOSIS — I4891 Unspecified atrial fibrillation: Secondary | ICD-10-CM

## 2013-10-08 DIAGNOSIS — I442 Atrioventricular block, complete: Secondary | ICD-10-CM

## 2013-10-08 LAB — MDC_IDC_ENUM_SESS_TYPE_INCLINIC
Battery Remaining Longevity: 9 mo
Brady Statistic RV Percent Paced: 100 %
Date Time Interrogation Session: 20150327154130
Implantable Pulse Generator Model: 8042
Lead Channel Impedance Value: 512 Ohm
Lead Channel Impedance Value: 965 Ohm
Lead Channel Setting Pacing Amplitude: 2.5 V
Lead Channel Setting Pacing Amplitude: 2.5 V
Lead Channel Setting Pacing Pulse Width: 0.4 ms
MDC IDC MSMT BATTERY VOLTAGE: 2.84 V
MDC IDC MSMT LEADCHNL RA IMPEDANCE VALUE: 0 Ohm
MDC IDC SET LEADCHNL LV PACING PULSEWIDTH: 0.5 ms
MDC IDC SET LEADCHNL RV SENSING SENSITIVITY: 2.8 mV

## 2013-10-08 LAB — POCT INR: INR: 4

## 2013-10-11 NOTE — Addendum Note (Signed)
Addended by: Sebastian AcheSAUNDERS, SHAKILA S on: 10/11/2013 10:52 AM   Modules accepted: Level of Service

## 2013-10-11 NOTE — Progress Notes (Signed)
Interrogation for battery longevity only(N/C). Patient has an estimated 9 months left on the device with a range of 7-3410months. Testing was not performed this office visit. No ventricular high rates noted. Patient will follow up with the device clinic on 5-8 (billable) and with GT in August.

## 2013-10-22 ENCOUNTER — Ambulatory Visit (INDEPENDENT_AMBULATORY_CARE_PROVIDER_SITE_OTHER): Payer: Medicare Other

## 2013-10-22 DIAGNOSIS — I4891 Unspecified atrial fibrillation: Secondary | ICD-10-CM

## 2013-10-22 LAB — POCT INR: INR: 2.8

## 2013-11-04 ENCOUNTER — Encounter: Payer: Self-pay | Admitting: Internal Medicine

## 2013-11-19 ENCOUNTER — Ambulatory Visit (INDEPENDENT_AMBULATORY_CARE_PROVIDER_SITE_OTHER): Payer: Medicare Other | Admitting: Pharmacist

## 2013-11-19 ENCOUNTER — Ambulatory Visit (INDEPENDENT_AMBULATORY_CARE_PROVIDER_SITE_OTHER): Payer: Medicare Other | Admitting: *Deleted

## 2013-11-19 DIAGNOSIS — I4891 Unspecified atrial fibrillation: Secondary | ICD-10-CM

## 2013-11-19 LAB — MDC_IDC_ENUM_SESS_TYPE_INCLINIC: Implantable Pulse Generator Model: 8042

## 2013-11-19 LAB — POCT INR: INR: 3.3

## 2013-11-19 NOTE — Progress Notes (Signed)
Pacemaker check in clinic. Battery longevity 7 months with range < 5 to 8 months.  Monthly check for battery.

## 2013-11-30 ENCOUNTER — Encounter: Payer: Self-pay | Admitting: Internal Medicine

## 2013-12-05 ENCOUNTER — Other Ambulatory Visit: Payer: Self-pay | Admitting: Internal Medicine

## 2013-12-10 ENCOUNTER — Ambulatory Visit (INDEPENDENT_AMBULATORY_CARE_PROVIDER_SITE_OTHER): Payer: Medicare Other | Admitting: *Deleted

## 2013-12-10 ENCOUNTER — Ambulatory Visit (INDEPENDENT_AMBULATORY_CARE_PROVIDER_SITE_OTHER): Payer: Medicare Other | Admitting: Pharmacist

## 2013-12-10 DIAGNOSIS — I442 Atrioventricular block, complete: Secondary | ICD-10-CM

## 2013-12-10 DIAGNOSIS — I4891 Unspecified atrial fibrillation: Secondary | ICD-10-CM

## 2013-12-10 LAB — MDC_IDC_ENUM_SESS_TYPE_INCLINIC
Battery Remaining Longevity: 7 mo
Brady Statistic RV Percent Paced: 99 %
Date Time Interrogation Session: 20150529135732
Implantable Pulse Generator Model: 8042
Lead Channel Impedance Value: 988 Ohm
Lead Channel Setting Pacing Amplitude: 2.5 V
Lead Channel Setting Pacing Amplitude: 2.5 V
Lead Channel Setting Pacing Pulse Width: 0.5 ms
MDC IDC MSMT BATTERY VOLTAGE: 2.81 V
MDC IDC MSMT LEADCHNL RA IMPEDANCE VALUE: 0 Ohm
MDC IDC MSMT LEADCHNL RV IMPEDANCE VALUE: 561 Ohm
MDC IDC SET LEADCHNL RV PACING PULSEWIDTH: 0.4 ms
MDC IDC SET LEADCHNL RV SENSING SENSITIVITY: 2.8 mV

## 2013-12-10 LAB — POCT INR: INR: 3

## 2013-12-10 NOTE — Progress Notes (Signed)
Batt check only (N/C). Estimated longevity of 7 months. No mode switch or ventricular high rate episodes. Testing was not performed this session. Patient will follow up with the Device Clinic on 6-26 and with GT in August 2015 (billable).

## 2013-12-21 ENCOUNTER — Encounter: Payer: Self-pay | Admitting: Psychiatry

## 2014-01-04 ENCOUNTER — Other Ambulatory Visit: Payer: Self-pay | Admitting: Internal Medicine

## 2014-01-07 ENCOUNTER — Ambulatory Visit (INDEPENDENT_AMBULATORY_CARE_PROVIDER_SITE_OTHER): Payer: Medicare Other | Admitting: *Deleted

## 2014-01-07 ENCOUNTER — Encounter: Payer: Self-pay | Admitting: Internal Medicine

## 2014-01-07 ENCOUNTER — Ambulatory Visit (INDEPENDENT_AMBULATORY_CARE_PROVIDER_SITE_OTHER): Payer: Medicare Other

## 2014-01-07 DIAGNOSIS — I4891 Unspecified atrial fibrillation: Secondary | ICD-10-CM

## 2014-01-07 DIAGNOSIS — I482 Chronic atrial fibrillation, unspecified: Secondary | ICD-10-CM

## 2014-01-07 LAB — MDC_IDC_ENUM_SESS_TYPE_INCLINIC
Battery Remaining Longevity: 6 mo
Battery Voltage: 2.81 V
Brady Statistic RV Percent Paced: 100 %
Date Time Interrogation Session: 20150626122519
Lead Channel Impedance Value: 0 Ohm
Lead Channel Setting Pacing Amplitude: 2.5 V
Lead Channel Setting Pacing Amplitude: 2.5 V
Lead Channel Setting Pacing Pulse Width: 0.4 ms
Lead Channel Setting Pacing Pulse Width: 0.5 ms
Lead Channel Setting Sensing Sensitivity: 2.8 mV
MDC IDC MSMT LEADCHNL LV IMPEDANCE VALUE: 810 Ohm
MDC IDC MSMT LEADCHNL RV IMPEDANCE VALUE: 486 Ohm

## 2014-01-07 LAB — POCT INR: INR: 2.9

## 2014-01-07 NOTE — Progress Notes (Signed)
CRT-P batt check only (N/C). Estimated longevity 6 months with a minimum of <5 months. Permanent AF + Warfarin. No VHR episodes noted. 10 VS episodes----max dur. 26 sec, Max V 83. Patient will follow up with the Device Clinic on 7-24 and with GT in 02-2014.

## 2014-02-04 ENCOUNTER — Ambulatory Visit (INDEPENDENT_AMBULATORY_CARE_PROVIDER_SITE_OTHER): Payer: Medicare Other | Admitting: *Deleted

## 2014-02-04 ENCOUNTER — Telehealth: Payer: Self-pay | Admitting: Internal Medicine

## 2014-02-04 DIAGNOSIS — I4891 Unspecified atrial fibrillation: Secondary | ICD-10-CM

## 2014-02-04 DIAGNOSIS — I442 Atrioventricular block, complete: Secondary | ICD-10-CM

## 2014-02-04 DIAGNOSIS — I482 Chronic atrial fibrillation, unspecified: Secondary | ICD-10-CM

## 2014-02-04 LAB — MDC_IDC_ENUM_SESS_TYPE_INCLINIC
Battery Remaining Longevity: 5 mo
Date Time Interrogation Session: 20150724143230
Lead Channel Impedance Value: 0 Ohm
Lead Channel Impedance Value: 506 Ohm
Lead Channel Impedance Value: 894 Ohm
Lead Channel Setting Pacing Amplitude: 2.5 V
Lead Channel Setting Pacing Amplitude: 2.5 V
MDC IDC MSMT BATTERY VOLTAGE: 2.8 V
MDC IDC SET LEADCHNL LV PACING PULSEWIDTH: 0.5 ms
MDC IDC SET LEADCHNL RV PACING PULSEWIDTH: 0.4 ms
MDC IDC SET LEADCHNL RV SENSING SENSITIVITY: 2.8 mV
MDC IDC STAT BRADY RV PERCENT PACED: 100 %

## 2014-02-04 LAB — POCT INR: INR: 2.7

## 2014-02-04 NOTE — Telephone Encounter (Signed)
Walk in pt Form " Disability Pla-Card" [facility]

## 2014-02-08 NOTE — Progress Notes (Signed)
CRT-P check in clinic for batt only (N/C). Estimated battery longevity is currently 5 months with a range of <4-6 months. Testing was not performed this ov. Patient will follow up with GT in 02-2014 as scheduled.

## 2014-02-18 ENCOUNTER — Telehealth: Payer: Self-pay | Admitting: Internal Medicine

## 2014-02-18 NOTE — Telephone Encounter (Signed)
Mailed today

## 2014-02-18 NOTE — Telephone Encounter (Signed)
New message     Have Dr Ladona Ridgelaylor signed her handicapp parking pass?  She dropped it off at the front desk.  Please call

## 2014-03-01 ENCOUNTER — Other Ambulatory Visit: Payer: Self-pay | Admitting: *Deleted

## 2014-03-01 MED ORDER — WARFARIN SODIUM 5 MG PO TABS: 5.0000 mg | ORAL_TABLET | ORAL | Status: DC

## 2014-03-08 ENCOUNTER — Encounter: Payer: Self-pay | Admitting: Internal Medicine

## 2014-03-08 ENCOUNTER — Ambulatory Visit (INDEPENDENT_AMBULATORY_CARE_PROVIDER_SITE_OTHER): Payer: Medicare Other | Admitting: Internal Medicine

## 2014-03-08 ENCOUNTER — Ambulatory Visit (INDEPENDENT_AMBULATORY_CARE_PROVIDER_SITE_OTHER): Payer: Medicare Other | Admitting: *Deleted

## 2014-03-08 VITALS — BP 129/81 | HR 81 | Ht 64.5 in | Wt 129.0 lb

## 2014-03-08 DIAGNOSIS — Z950 Presence of cardiac pacemaker: Secondary | ICD-10-CM

## 2014-03-08 DIAGNOSIS — I482 Chronic atrial fibrillation, unspecified: Secondary | ICD-10-CM

## 2014-03-08 DIAGNOSIS — I4891 Unspecified atrial fibrillation: Secondary | ICD-10-CM

## 2014-03-08 DIAGNOSIS — F17200 Nicotine dependence, unspecified, uncomplicated: Secondary | ICD-10-CM

## 2014-03-08 LAB — MDC_IDC_ENUM_SESS_TYPE_INCLINIC
Battery Remaining Longevity: 5 mo
Battery Voltage: 2.8 V
Date Time Interrogation Session: 20150825121748
Lead Channel Impedance Value: 536 Ohm
Lead Channel Impedance Value: 958 Ohm
Lead Channel Pacing Threshold Amplitude: 1 V
Lead Channel Pacing Threshold Pulse Width: 0.5 ms
Lead Channel Setting Pacing Amplitude: 2.5 V
Lead Channel Setting Pacing Pulse Width: 0.5 ms
MDC IDC MSMT LEADCHNL RA IMPEDANCE VALUE: 0 Ohm
MDC IDC MSMT LEADCHNL RV PACING THRESHOLD AMPLITUDE: 1 V
MDC IDC MSMT LEADCHNL RV PACING THRESHOLD PULSEWIDTH: 0.4 ms
MDC IDC SET LEADCHNL RV PACING AMPLITUDE: 2.5 V
MDC IDC SET LEADCHNL RV PACING PULSEWIDTH: 0.4 ms
MDC IDC SET LEADCHNL RV SENSING SENSITIVITY: 2.8 mV
MDC IDC STAT BRADY RV PERCENT PACED: 99 %

## 2014-03-08 LAB — POCT INR: INR: 2.9

## 2014-03-08 NOTE — Patient Instructions (Signed)
Your physician recommends that you schedule a follow-up appointment in: 4 weeks with device clinic.  Call Belenda Cruise to get date

## 2014-03-08 NOTE — Progress Notes (Signed)
HPI [name] returns today for followup. She is a pleasant 80 year old woman with complete heart block, and chronic atrial fibrillation, and congestive heart failure, status post biventricular pacemaker insertion. In the interim, she has been stable. She denies chest pain, shortness of breath except with exertion, or syncope. She is approximately 6 months out from elective replacement. The patient denies chest pain. She has class II heart failure symptoms.  She has minimal peripheral edema. She is still smoking.  No Known Allergies   Current Outpatient Prescriptions  Medication Sig Dispense Refill  . escitalopram (LEXAPRO) 10 MG tablet Take 10 mg by mouth daily.        . hydrochlorothiazide (HYDRODIURIL) 25 MG tablet TAKE 1/2 TABLET EVERY DAY  45 tablet  3  . KLOR-CON M10 10 MEQ tablet TAKE 1 TABLET BY MOUTH EVERY DAY  30 tablet  6  . PROAIR HFA 108 (90 BASE) MCG/ACT inhaler INHALE 2 PUFFS INTO THE LUNGS 4 (FOUR) TIMES DAILY AS NEEDED FOR WHEEZING.  8.5 each  0  . SPIRIVA HANDIHALER 18 MCG inhalation capsule PLACE 1 CAPSULE (18 MCG TOTAL) INTO INHALER AND INHALE DAILY.  30 capsule  0  . warfarin (COUMADIN) 5 MG tablet Take 1 tablet (5 mg total) by mouth as directed.  30 tablet  3   No current facility-administered medications for this visit.     Past Medical History  Diagnosis Date  . Tobacco abuse   . Paroxysmal atrial fibrillation     AICD/pacemaker  . Pulmonary nodule   . COPD (chronic obstructive pulmonary disease)     Spirometry 06/10/05, FEV1 46% and ratio 51% with 14% better after albuterol    ROS:   All systems reviewed and negative except as noted in the HPI.   Past Surgical History  Procedure Laterality Date  . Pacemaker insertion    . Soft tissue tumor resection      From right forearm  . Skin graft       Family History  Problem Relation Age of Onset  . Other Neg Hx     Respiratory disease     History   Social History  . Marital Status: Divorced   Spouse Name: N/A    Number of Children: N/A  . 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 Smoking status: Current Every Day Smoker -- 1.00 packs/day for 50 years  . Smokeless tobacco: Never Used     Comment: Counseling, down to less than half ppd as of 03/04/11  . Alcohol Use: No  . Drug Use: Not on file  . Sexual Activity: Not on file   Other Topics Concern  . Not on file   Social History Narrative   Divorced   1 daughter     BP 129/81  Pulse 81  Ht 5' 4.5" (1.638 m)  Wt 129 lb (58.514 kg)  BMI 21.81 kg/m2  Physical Exam:  elderly appearing 80 year old woman, NAD HEENT: Unremarkable Neck:  7 cm JVD, no thyromegally Lungs:  Clear with no wheezes, rales, or rhonchi. Decreased breath sounds. HEART:  Regular rate rhythm, no murmurs, no rubs, no clicks Abd:  soft, positive bowel sounds, no organomegally, no rebound, no guarding Ext:  2 plus pulses, no edema, no cyanosis, no clubbing Skin:  No rashes no nodules Neuro:  CN II through XII intact, motor grossly intact  EKG - atrial fibrillation with ventricular pacing  DEVICE  Normal  device function.  See PaceArt for details. She is approaching elective replacement  Assess/Plan:

## 2014-03-08 NOTE — Assessment & Plan Note (Signed)
Her Medtronic BiV PM is working normally. Will recheck in several months. 

## 2014-03-08 NOTE — Assessment & Plan Note (Signed)
I continue to encourage her to stop smoking.

## 2014-03-08 NOTE — Assessment & Plan Note (Signed)
She is now chronically in atrial fib and her rate is controlled. Will follow.

## 2014-03-15 ENCOUNTER — Encounter: Payer: Self-pay | Admitting: Internal Medicine

## 2014-03-18 ENCOUNTER — Encounter: Payer: Self-pay | Admitting: Internal Medicine

## 2014-04-19 ENCOUNTER — Ambulatory Visit (INDEPENDENT_AMBULATORY_CARE_PROVIDER_SITE_OTHER): Payer: Medicare Other | Admitting: *Deleted

## 2014-04-19 ENCOUNTER — Encounter: Payer: Self-pay | Admitting: Internal Medicine

## 2014-04-19 DIAGNOSIS — I442 Atrioventricular block, complete: Secondary | ICD-10-CM

## 2014-04-19 DIAGNOSIS — I4891 Unspecified atrial fibrillation: Secondary | ICD-10-CM

## 2014-04-19 LAB — MDC_IDC_ENUM_SESS_TYPE_INCLINIC
Implantable Pulse Generator Model: 8042
Lead Channel Setting Pacing Amplitude: 2.5 V
Lead Channel Setting Pacing Pulse Width: 0.5 ms
MDC IDC SET LEADCHNL RV PACING AMPLITUDE: 2.5 V
MDC IDC SET LEADCHNL RV PACING PULSEWIDTH: 0.4 ms
MDC IDC SET LEADCHNL RV SENSING SENSITIVITY: 2.8 mV

## 2014-04-19 LAB — POCT INR: INR: 2.6

## 2014-04-19 NOTE — Progress Notes (Signed)
Pacemaker check for battery. Battery longevity 3 months with range < 2 to 3 months. ROV in 6 weeks with Coumadin check. Next billable in November.

## 2014-06-01 ENCOUNTER — Ambulatory Visit (INDEPENDENT_AMBULATORY_CARE_PROVIDER_SITE_OTHER): Payer: Medicare Other | Admitting: *Deleted

## 2014-06-01 DIAGNOSIS — I4891 Unspecified atrial fibrillation: Secondary | ICD-10-CM

## 2014-06-01 DIAGNOSIS — I442 Atrioventricular block, complete: Secondary | ICD-10-CM

## 2014-06-01 LAB — MDC_IDC_ENUM_SESS_TYPE_INCLINIC
Battery Remaining Longevity: 1 mo
Date Time Interrogation Session: 20151118170744
Lead Channel Impedance Value: 488 Ohm
Lead Channel Impedance Value: 722 Ohm
Lead Channel Setting Pacing Amplitude: 2.5 V
Lead Channel Setting Pacing Amplitude: 2.5 V
Lead Channel Setting Pacing Pulse Width: 0.5 ms
Lead Channel Setting Sensing Sensitivity: 2.8 mV
MDC IDC MSMT BATTERY VOLTAGE: 2.64 V
MDC IDC MSMT LEADCHNL RA IMPEDANCE VALUE: 0 Ohm
MDC IDC SET LEADCHNL RV PACING PULSEWIDTH: 0.4 ms
MDC IDC STAT BRADY RV PERCENT PACED: 99 %

## 2014-06-01 LAB — POCT INR: INR: 2.7

## 2014-06-01 NOTE — Progress Notes (Signed)
CRT-P check in clinic for batt estimate only (N/C). Longevity estimated at 1 month (range of <1-2 months. 3 "VS" episodes recorded---(1)?NSVT x 7 bts @ 233bpm. Patient to F/U with Device Clinic on 12-16 (billable) and with GT in 02-2015.

## 2014-06-21 ENCOUNTER — Encounter: Payer: Self-pay | Admitting: Internal Medicine

## 2014-06-29 ENCOUNTER — Ambulatory Visit (INDEPENDENT_AMBULATORY_CARE_PROVIDER_SITE_OTHER): Payer: Medicare Other | Admitting: *Deleted

## 2014-06-29 DIAGNOSIS — Z950 Presence of cardiac pacemaker: Secondary | ICD-10-CM

## 2014-06-29 DIAGNOSIS — I482 Chronic atrial fibrillation, unspecified: Secondary | ICD-10-CM

## 2014-06-29 DIAGNOSIS — I4891 Unspecified atrial fibrillation: Secondary | ICD-10-CM

## 2014-06-29 DIAGNOSIS — I5032 Chronic diastolic (congestive) heart failure: Secondary | ICD-10-CM

## 2014-06-29 LAB — MDC_IDC_ENUM_SESS_TYPE_INCLINIC
Battery Voltage: 2.67 V
Brady Statistic RV Percent Paced: 99 %
Date Time Interrogation Session: 20151216122758
Implantable Pulse Generator Model: 8042
Lead Channel Impedance Value: 0 Ohm
Lead Channel Impedance Value: 553 Ohm
MDC IDC MSMT BATTERY REMAINING LONGEVITY: 0 mo
MDC IDC MSMT LEADCHNL LV IMPEDANCE VALUE: 0 Ohm
MDC IDC SET LEADCHNL RV PACING AMPLITUDE: 2.5 V
MDC IDC SET LEADCHNL RV PACING PULSEWIDTH: 0.4 ms
MDC IDC SET LEADCHNL RV SENSING SENSITIVITY: 2.8 mV

## 2014-06-29 LAB — POCT INR: INR: 2.6

## 2014-06-29 NOTE — Progress Notes (Signed)
In Sync III battery recall---Device reached ERI 06/07/14, device reverted to VVI/65. Pt dependent. Changed mode to VVIR/70. ROV w/ Dr. Ladona Ridgelaylor 07/12/14.

## 2014-07-03 ENCOUNTER — Other Ambulatory Visit: Payer: Self-pay | Admitting: Internal Medicine

## 2014-07-12 ENCOUNTER — Encounter: Payer: Self-pay | Admitting: *Deleted

## 2014-07-12 ENCOUNTER — Encounter: Payer: Self-pay | Admitting: Internal Medicine

## 2014-07-12 ENCOUNTER — Ambulatory Visit (INDEPENDENT_AMBULATORY_CARE_PROVIDER_SITE_OTHER): Payer: Medicare Other | Admitting: Internal Medicine

## 2014-07-12 VITALS — BP 158/74 | HR 65 | Ht 64.0 in | Wt 126.0 lb

## 2014-07-12 DIAGNOSIS — Z950 Presence of cardiac pacemaker: Secondary | ICD-10-CM

## 2014-07-12 DIAGNOSIS — I482 Chronic atrial fibrillation, unspecified: Secondary | ICD-10-CM

## 2014-07-12 DIAGNOSIS — I5032 Chronic diastolic (congestive) heart failure: Secondary | ICD-10-CM

## 2014-07-12 DIAGNOSIS — I442 Atrioventricular block, complete: Secondary | ICD-10-CM

## 2014-07-12 DIAGNOSIS — Z720 Tobacco use: Secondary | ICD-10-CM

## 2014-07-12 DIAGNOSIS — F17200 Nicotine dependence, unspecified, uncomplicated: Secondary | ICD-10-CM

## 2014-07-12 LAB — CBC WITH DIFFERENTIAL/PLATELET
BASOS ABS: 0 10*3/uL (ref 0.0–0.1)
BASOS PCT: 0.3 % (ref 0.0–3.0)
Eosinophils Absolute: 0.1 10*3/uL (ref 0.0–0.7)
Eosinophils Relative: 0.9 % (ref 0.0–5.0)
HCT: 43.1 % (ref 36.0–46.0)
HEMOGLOBIN: 13.9 g/dL (ref 12.0–15.0)
LYMPHS PCT: 30 % (ref 12.0–46.0)
Lymphs Abs: 2 10*3/uL (ref 0.7–4.0)
MCHC: 32.1 g/dL (ref 30.0–36.0)
MCV: 87.2 fl (ref 78.0–100.0)
Monocytes Absolute: 0.5 10*3/uL (ref 0.1–1.0)
Monocytes Relative: 7 % (ref 3.0–12.0)
NEUTROS PCT: 61.8 % (ref 43.0–77.0)
Neutro Abs: 4.1 10*3/uL (ref 1.4–7.7)
Platelets: 155 10*3/uL (ref 150.0–400.0)
RBC: 4.95 Mil/uL (ref 3.87–5.11)
RDW: 14.2 % (ref 11.5–15.5)
WBC: 6.6 10*3/uL (ref 4.0–10.5)

## 2014-07-12 LAB — BASIC METABOLIC PANEL
BUN: 16 mg/dL (ref 6–23)
CALCIUM: 8.9 mg/dL (ref 8.4–10.5)
CHLORIDE: 108 meq/L (ref 96–112)
CO2: 30 meq/L (ref 19–32)
Creatinine, Ser: 0.8 mg/dL (ref 0.4–1.2)
GFR: 71.05 mL/min (ref >60.00)
Glucose, Bld: 88 mg/dL (ref 70–99)
Potassium: 4.2 mEq/L (ref 3.5–5.1)
SODIUM: 144 meq/L (ref 135–145)

## 2014-07-12 LAB — PROTIME-INR
INR: 1.9 ratio — ABNORMAL HIGH (ref 0.8–1.0)
PROTHROMBIN TIME: 20.9 s — AB (ref 9.6–13.1)

## 2014-07-12 NOTE — Assessment & Plan Note (Signed)
Her ventricular rate is well controlled. No change in meds. 

## 2014-07-12 NOTE — Assessment & Plan Note (Signed)
Her current device has reached ERI. She will be scheduled for elective removal of her old PPM and insertion of a new PPM. The risks/benefits/goals/expectations of the procedure have been discussed with the patient and she wishes to proceed. Will check labs.

## 2014-07-12 NOTE — Assessment & Plan Note (Signed)
Her smoking is less. She is encouraged to stop.

## 2014-07-12 NOTE — Assessment & Plan Note (Signed)
Her symptoms are class 2. She will continue her current meds and maintain a low sodium diet. 

## 2014-07-12 NOTE — Progress Notes (Signed)
HPI [name] returns today for followup. She is a pleasant 80 year old woman with complete heart block, and chronic atrial fibrillation, and congestive heart failure, status post biventricular pacemaker insertion. In the interim, she has been stable. She denies chest pain, shortness of breath except with exertion, or syncope. She is approximately 6 months out from elective replacement. The patient denies chest pain. She has class II heart failure symptoms.  She has minimal peripheral edema. She is still smoking. She has reached ERI.  No Known Allergies   Current Outpatient Prescriptions  Medication Sig Dispense Refill  . escitalopram (LEXAPRO) 20 MG tablet Take 20 mg by mouth daily.    Marland Kitchen. PROAIR HFA 108 (90 BASE) MCG/ACT inhaler INHALE 2 PUFFS INTO THE LUNGS 4 (FOUR) TIMES DAILY AS NEEDED FOR WHEEZING. 8.5 each 0  . SPIRIVA HANDIHALER 18 MCG inhalation capsule PLACE 1 CAPSULE (18 MCG TOTAL) INTO INHALER AND INHALE DAILY. 30 capsule 0  . warfarin (COUMADIN) 5 MG tablet TAKE 1 TABLET BY MOUTH AS DIRECTED 30 tablet 3   No current facility-administered medications for this visit.     Past Medical History  Diagnosis Date  . Tobacco abuse   . Paroxysmal atrial fibrillation     AICD/pacemaker  . Pulmonary nodule   . COPD (chronic obstructive pulmonary disease)     Spirometry 06/10/05, FEV1 46% and ratio 51% with 14% better after albuterol    ROS:   All systems reviewed and negative except as noted in the HPI.   Past Surgical History  Procedure Laterality Date  . Pacemaker insertion    . Soft tissue tumor resection      From right forearm  . Skin graft       Family History  Problem Relation Age of Onset  . Other Neg Hx     Respiratory disease     History   Social History  . Marital Status: Divorced    Spouse Name: N/A    Number of Children: N/A  . 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 Smoking  status: Current Every Day Smoker -- 1.00 packs/day for 50 years  . Smokeless tobacco: Never Used     Comment: Counseling, down to less than half ppd as of 03/04/11  . Alcohol Use: No  . Drug Use: Not on file  . Sexual Activity: Not on file   Other Topics Concern  . Not on file   Social History Narrative   Divorced   1 daughter     BP 158/74 mmHg  Pulse 65  Ht 5\' 4"  (1.626 m)  Wt 126 lb (57.153 kg)  BMI 21.62 kg/m2  Physical Exam:  elderly appearing 80 year old woman, NAD HEENT: Unremarkable Neck:  7 cm JVD, no thyromegally Lungs:  Clear with no wheezes, rales, or rhonchi. Decreased breath sounds. HEART:  Regular rate rhythm, no murmurs, no rubs, no clicks Abd:  soft, positive bowel sounds, no organomegally, no rebound, no guarding Ext:  2 plus pulses, no edema, no cyanosis, no clubbing Skin:  No rashes no nodules Neuro:  CN II through XII intact, motor grossly intact  EKG - atrial fibrillation with ventricular pacing  DEVICE  Normal device function.  See PaceArt for details. She has reached elective replacement  Assess/Plan:

## 2014-07-12 NOTE — Patient Instructions (Addendum)
Your physician recommends that you schedule a follow-up appointment in: 7-10 days from 08/05/14 in device clinic for wound check  Your physician recommends that you return for lab work today   Pacemaker Battery Change A pacemaker battery usually lasts 4 to 12 years. Once or twice per year, you will be asked to visit your health care provider to have a full evaluation of your pacemaker. When a battery needs to be replaced, the entire pacemaker is replaced so that you can benefit from new circuitry and any new features that have been added to pacemakers. Most often, this procedure is very simple because the leads are already in place.  There are many things that affect how long a pacemaker battery will last, including:   The age of the pacemaker.   The number of leads (1, 2, or 3).   The pacemaker work load. If the pacemaker is helping the heart more often, the battery will not last as long as it would if the pacemaker did not need to help the heart.   Power (voltage) settings. LET [facility] KNOW ABOUT:   Any allergies you have.   All medicines you are taking, including vitamins, herbs, eye drops, creams, and over-the-counter medicines.   Previous problems you or members of your family have had with the use of anesthetics.   Any blood disorders you have.   Previous surgeries you have had, especially since your last pacemaker placement.   Medical conditions you have.   Possibility of pregnancy, if this applies.  Symptoms of chest pain, trouble breathing, palpitations, light-headedness, or feelings of an abnormal or irregular heartbeat. RISKS AND COMPLICATIONS  Generally, this is a safe procedure. However, as with any procedure, problems can occur and include:   Bleeding.   Bruising of the skin around where the incision was made.   Pain at the incision site.   Pulling apart of the skin at the incision site.   Infection.   Allergic reaction to  anesthetics or other medicines used during the procedure.  People with diabetes may have a temporary increase in their blood sugar after any surgical procedure.  BEFORE THE PROCEDURE   Wash all of the skin around the area of the chest where the pacemaker is located.   Ask your health care provider for help with any medicine adjustments before the pacemaker is replaced.   Do not eat or drink anything after midnight on the night before the procedure or as directed by your health care provider.  Ask your health care provider if you can take a sip of water with any approved medicines the morning of the procedure. PROCEDURE   After giving medicine to numb the skin (local anesthetic), your health care provider will make a cut to reopen the pocket holding the pacemaker.   The old pacemaker will be disconnected from its leads.   The leads will be tested.   If needed, the leads will be replaced. If the leads are functioning properly, the new pacemaker may be connected to the existing leads.  A heart monitor and the pacemaker programmer will be used to make sure that the new pacemaker is working properly.  The incision site will then be closed. A dressing will be placed over the pacemaker site. The dressing will be removed 24-48 hours afterward. AFTER THE PROCEDURE   You will be taken to a recovery area after the new pacemaker implant is completed. Your vital signs such as blood pressure, heart rate,  breathing, and oxygen levels will be monitored.  Your health care provider will tell you when you will need to next test your pacemaker or when to return to the office for follow-up for removal of stitches. Document Released: 10/09/2006 Document Revised: 11/15/2013 Document Reviewed: 01/13/2013 [facility] Patient Information 2015 [employer], [place]. This information is not intended to replace advice given to you by your health care provider. Make sure you discuss any questions you have with your  health care provider.

## 2014-07-13 ENCOUNTER — Other Ambulatory Visit: Payer: Self-pay | Admitting: *Deleted

## 2014-07-13 DIAGNOSIS — Z950 Presence of cardiac pacemaker: Secondary | ICD-10-CM

## 2014-07-13 DIAGNOSIS — I5032 Chronic diastolic (congestive) heart failure: Secondary | ICD-10-CM

## 2014-07-13 DIAGNOSIS — I482 Chronic atrial fibrillation, unspecified: Secondary | ICD-10-CM

## 2014-07-13 DIAGNOSIS — I442 Atrioventricular block, complete: Secondary | ICD-10-CM

## 2014-07-27 ENCOUNTER — Encounter: Payer: Self-pay | Admitting: Internal Medicine

## 2014-07-27 DIAGNOSIS — I442 Atrioventricular block, complete: Secondary | ICD-10-CM | POA: Diagnosis present

## 2014-07-27 DIAGNOSIS — I482 Chronic atrial fibrillation: Secondary | ICD-10-CM | POA: Diagnosis not present

## 2014-07-27 DIAGNOSIS — I5022 Chronic systolic (congestive) heart failure: Secondary | ICD-10-CM | POA: Diagnosis not present

## 2014-07-27 MED ORDER — SODIUM CHLORIDE 0.9 % IV SOLN
INTRAVENOUS | Status: DC
  Administered 2014-07-28: 08:00:00 via INTRAVENOUS

## 2014-07-27 MED ORDER — SODIUM CHLORIDE 0.9 % IV SOLN: 250.0000 mL | INTRAVENOUS | Status: DC

## 2014-07-27 MED ORDER — CEFAZOLIN SODIUM-DEXTROSE 2-3 GM-% IV SOLR: 2.0000 g | INTRAVENOUS | Status: DC

## 2014-07-27 MED ORDER — SODIUM CHLORIDE 0.9 % IJ SOLN: 3.0000 mL | Freq: Two times a day (BID) | INTRAMUSCULAR | Status: DC

## 2014-07-27 MED ORDER — SODIUM CHLORIDE 0.9 % IJ SOLN: 3.0000 mL | INTRAMUSCULAR | Status: DC | PRN

## 2014-07-27 MED ORDER — SODIUM CHLORIDE 0.9 % IR SOLN
80.0000 mg | Status: DC
  Filled 2014-07-27: qty 2

## 2014-07-27 MED ORDER — MUPIROCIN 2 % EX OINT
1.0000 "application " | TOPICAL_OINTMENT | Freq: Once | CUTANEOUS | Status: AC
  Administered 2014-07-28: 1 via TOPICAL
  Filled 2014-07-27: qty 22

## 2014-07-28 ENCOUNTER — Encounter (HOSPITAL_COMMUNITY): Payer: Self-pay

## 2014-07-28 ENCOUNTER — Encounter (HOSPITAL_COMMUNITY): Payer: Self-pay | Admitting: Internal Medicine

## 2014-07-28 ENCOUNTER — Ambulatory Visit (HOSPITAL_COMMUNITY)
Admission: RE | Admit: 2014-07-28 | Discharge: 2014-07-28 | Disposition: A | Discharge status: Home / Self Care | Payer: Medicare Other | Source: Ambulatory Visit | Attending: Internal Medicine | Admitting: Internal Medicine

## 2014-07-28 ENCOUNTER — Encounter (HOSPITAL_COMMUNITY)
Admission: RE | Disposition: A | Discharge status: Home / Self Care | Payer: Self-pay | Source: Ambulatory Visit | Attending: Internal Medicine

## 2014-07-28 DIAGNOSIS — I482 Chronic atrial fibrillation, unspecified: Secondary | ICD-10-CM

## 2014-07-28 DIAGNOSIS — I5032 Chronic diastolic (congestive) heart failure: Secondary | ICD-10-CM

## 2014-07-28 DIAGNOSIS — I442 Atrioventricular block, complete: Secondary | ICD-10-CM | POA: Insufficient documentation

## 2014-07-28 DIAGNOSIS — I5022 Chronic systolic (congestive) heart failure: Secondary | ICD-10-CM | POA: Insufficient documentation

## 2014-07-28 DIAGNOSIS — Z950 Presence of cardiac pacemaker: Secondary | ICD-10-CM

## 2014-07-28 DIAGNOSIS — Z45010 Encounter for checking and testing of cardiac pacemaker pulse generator [battery]: Secondary | ICD-10-CM

## 2014-07-28 HISTORY — PX: BIV PACEMAKER GENERATOR CHANGE OUT: SHX5746

## 2014-07-28 LAB — SURGICAL PCR SCREEN
MRSA, PCR: NEGATIVE
Staphylococcus aureus: NEGATIVE

## 2014-07-28 LAB — CBC
HCT: 40.1 % (ref 36.0–46.0)
Hemoglobin: 12.7 g/dL (ref 12.0–15.0)
MCH: 28.2 pg (ref 26.0–34.0)
MCHC: 31.7 g/dL (ref 30.0–36.0)
MCV: 88.9 fL (ref 78.0–100.0)
Platelets: 196 10*3/uL (ref 150–400)
RBC: 4.51 MIL/uL (ref 3.87–5.11)
RDW: 13.7 % (ref 11.5–15.5)
WBC: 6.8 10*3/uL (ref 4.0–10.5)

## 2014-07-28 LAB — BASIC METABOLIC PANEL
Anion gap: 13 (ref 5–15)
BUN: 11 mg/dL (ref 6–23)
CHLORIDE: 102 meq/L (ref 96–112)
CO2: 31 mmol/L (ref 19–32)
CREATININE: 0.92 mg/dL (ref 0.50–1.10)
Calcium: 9.2 mg/dL (ref 8.4–10.5)
GFR, EST AFRICAN AMERICAN: 66 mL/min — AB (ref >90)
GFR, EST NON AFRICAN AMERICAN: 57 mL/min — AB (ref >90)
Glucose, Bld: 97 mg/dL (ref 70–99)
Potassium: 4.1 mmol/L (ref 3.5–5.1)
SODIUM: 146 mmol/L — AB (ref 135–145)

## 2014-07-28 LAB — PROTIME-INR
INR: 1.26 (ref 0.00–1.49)
PROTHROMBIN TIME: 15.9 s — AB (ref 11.6–15.2)

## 2014-07-28 SURGERY — BIV PACEMAKER GENERATOR CHANGE OUT
Anesthesia: LOCAL

## 2014-07-28 MED ORDER — CEFAZOLIN SODIUM-DEXTROSE 2-3 GM-% IV SOLR
INTRAVENOUS | Status: AC
  Filled 2014-07-28: qty 50

## 2014-07-28 MED ORDER — LIDOCAINE HCL (PF) 1 % IJ SOLN
INTRAMUSCULAR | Status: AC
  Filled 2014-07-28: qty 60

## 2014-07-28 MED ORDER — MIDAZOLAM HCL 5 MG/5ML IJ SOLN
INTRAMUSCULAR | Status: AC
  Filled 2014-07-28: qty 5

## 2014-07-28 MED ORDER — MUPIROCIN 2 % EX OINT
TOPICAL_OINTMENT | CUTANEOUS | Status: AC
  Administered 2014-07-28: 1 via TOPICAL
  Filled 2014-07-28: qty 22

## 2014-07-28 MED ORDER — ACETAMINOPHEN 325 MG PO TABS
325.0000 mg | ORAL_TABLET | ORAL | Status: DC | PRN
  Filled 2014-07-28: qty 2

## 2014-07-28 MED ORDER — CHLORHEXIDINE GLUCONATE 4 % EX LIQD
60.0000 mL | Freq: Once | CUTANEOUS | Status: DC
Start: 2014-07-28 — End: 2014-07-28
  Filled 2014-07-28: qty 60

## 2014-07-28 MED ORDER — FENTANYL CITRATE 0.05 MG/ML IJ SOLN
INTRAMUSCULAR | Status: AC
  Filled 2014-07-28: qty 2

## 2014-07-28 MED ORDER — ONDANSETRON HCL 4 MG/2ML IJ SOLN: 4.0000 mg | Freq: Four times a day (QID) | INTRAMUSCULAR | Status: DC | PRN

## 2014-07-28 NOTE — CV Procedure (Signed)
Electrophysiology Procedure Note  Procedure: removal of a previously implanted BiV PM which had reached ERI and insertion of a new BiV PPM.  Preoperative diagnosis: Complete heart block, chronic atrial fibrillation, chronic systolic heart failure, status post biventricular pacemaker with the current device at elective replacement.  Postoperative diagnosis: Same as preoperative diagnosis  Description of the procedure: After informed consent was obtained, the patient was taken to the diagnostic electrophysiology laboratory in the fasting state. After the usual preparation and draping, intravenous Versed and fentanyl were used for sedation. 30 cc of lidocaine was infiltrated into the left infraclavicular region. A 5 cm incision was carried out and electrocautery utilized to dissect down to the pacemaker pocket. The pacemaker generator was removed with gentle traction. The leads were disconnected from the generator and a new Medtronic biventricular pacemaker, serial number [id number] was connected to the old atrial, RV, and LV pacing leads. At this point, additional lidocaine was infiltrated into the floor of the pacemaker pocket and down into the deeper pectoralis major. Electrocautery and blunt dissection were utilized to make a subpectoral pocket. Electrocautery was utilized to assure hemostasis. The new biventricular pacemaker was inserted into the subpectoral pocket. The muscle was closed with Vicryl suture as was the subcutaneous tissue. Benzoin and Steri-Strips her pain on the skin. The patient was returned to her room in satisfactory condition.  Complications: There were no immediate procedure complications  Conclusion: Successful removal of a previously implanted biventricular pacemaker which had reached elective replacement, followed by insertion of a new biventricular pacemaker with relocation of the pocket into the subpectoral position.  Lewayne BuntingGregg Yanelly Cantrelle, M.D.

## 2014-07-28 NOTE — Discharge Instructions (Signed)
Pacemaker Battery Change, Care After °Refer to this sheet in the next few weeks. These instructions provide you with information on caring for yourself after your procedure. Your health care provider may also give you more specific instructions. Your treatment has been planned according to current medical practices, but problems sometimes occur. Call your health care provider if you have any problems or questions after your procedure. °WHAT TO EXPECT AFTER THE PROCEDURE °After your procedure, it is typical to have the following sensations: °· Soreness at the pacemaker site. °HOME CARE INSTRUCTIONS  °· Keep the incision clean and dry. °· Unless advised otherwise, you may shower beginning 48 hours after your procedure. °· For the first week after the replacement, avoid stretching motions that pull at the incision site, and avoid heavy exercise with the arm that is on the same side as the incision. °· Take medicines only as directed by your health care provider. °· Keep all follow-up visits as directed by your health care provider. °SEEK MEDICAL CARE IF:  °· You have pain at the incision site that is not relieved by over-the-counter or prescription medicine. °· There is drainage or pus from the incision site. °· There is swelling larger than a lime at the incision site. °· You develop red streaking that extends above or below the incision site. °· You feel brief, intermittent palpitations, light-headedness, or any symptoms that you feel might be related to your heart. °SEEK IMMEDIATE MEDICAL CARE IF:  °· You experience chest pain that is different than the pain at the pacemaker site. °· You experience shortness of breath. °· You have palpitations or irregular heartbeat. °· You have light-headedness that does not go away quickly. °· You faint. °· You have pain that gets worse and is not relieved by medicine. °Document Released: 04/21/2013 Document Revised: 11/15/2013 Document Reviewed: 04/21/2013 °Exit[employer]® Patient  Information ©2015 [employer]. This information is not intended to replace advice given to you by your health care provider. Make sure you discuss any questions you have with your health care provider. ° °

## 2014-08-04 ENCOUNTER — Ambulatory Visit: Payer: Medicare Other

## 2014-08-11 ENCOUNTER — Ambulatory Visit (INDEPENDENT_AMBULATORY_CARE_PROVIDER_SITE_OTHER): Payer: Medicare Other | Admitting: *Deleted

## 2014-08-11 DIAGNOSIS — I482 Chronic atrial fibrillation, unspecified: Secondary | ICD-10-CM

## 2014-08-11 DIAGNOSIS — I4891 Unspecified atrial fibrillation: Secondary | ICD-10-CM

## 2014-08-11 LAB — MDC_IDC_ENUM_SESS_TYPE_INCLINIC
Battery Remaining Longevity: 105 mo
Battery Voltage: 3.11 V
Brady Statistic AP VP Percent: 0 %
Brady Statistic RV Percent Paced: 99.87 %
Date Time Interrogation Session: 20160128141045
Lead Channel Impedance Value: 266 Ohm
Lead Channel Impedance Value: 361 Ohm
Lead Channel Impedance Value: 399 Ohm
Lead Channel Impedance Value: 399 Ohm
Lead Channel Impedance Value: 570 Ohm
Lead Channel Impedance Value: 703 Ohm
Lead Channel Pacing Threshold Amplitude: 1 V
Lead Channel Pacing Threshold Pulse Width: 0.4 ms
Lead Channel Sensing Intrinsic Amplitude: 2.875 mV
Lead Channel Sensing Intrinsic Amplitude: 4.25 mV
Lead Channel Setting Pacing Amplitude: 2 V
Lead Channel Setting Pacing Amplitude: 2 V
MDC IDC MSMT LEADCHNL LV IMPEDANCE VALUE: 703 Ohm
MDC IDC MSMT LEADCHNL RA IMPEDANCE VALUE: 437 Ohm
MDC IDC MSMT LEADCHNL RV IMPEDANCE VALUE: 494 Ohm
MDC IDC MSMT LEADCHNL RV PACING THRESHOLD AMPLITUDE: 1 V
MDC IDC MSMT LEADCHNL RV PACING THRESHOLD PULSEWIDTH: 0.4 ms
MDC IDC SET LEADCHNL LV PACING PULSEWIDTH: 0.4 ms
MDC IDC SET LEADCHNL RV PACING PULSEWIDTH: 0.4 ms
MDC IDC SET LEADCHNL RV SENSING SENSITIVITY: 5.6 mV
MDC IDC STAT BRADY AP VS PERCENT: 0 %
MDC IDC STAT BRADY AS VP PERCENT: 99.87 %
MDC IDC STAT BRADY AS VS PERCENT: 0.13 %
MDC IDC STAT BRADY RA PERCENT PACED: 0 %
Zone Setting Detection Interval: 400 ms
Zone Setting Detection Interval: 400 ms

## 2014-08-11 LAB — POCT INR: INR: 2.1

## 2014-08-11 NOTE — Progress Notes (Signed)
Wound check appointment. Steri-strips removed. Wound without redness or edema. Incision edges approximated, wound well healed. Normal device function. Thresholds, sensing, and impedances consistent with implant measurements.  Histogram distribution appropriate for patient and level of activity. No high ventricular rates noted. Patient educated about wound care, arm mobility, lifting restrictions. ROV in 3 months with implanting physician.

## 2014-09-02 ENCOUNTER — Encounter: Payer: Self-pay | Admitting: Internal Medicine

## 2014-09-08 ENCOUNTER — Telehealth: Payer: Self-pay | Admitting: Cardiology

## 2014-09-08 NOTE — Telephone Encounter (Signed)
[facility]

## 2014-09-12 ENCOUNTER — Emergency Department (HOSPITAL_COMMUNITY): Payer: Medicare Other

## 2014-09-12 ENCOUNTER — Encounter (HOSPITAL_COMMUNITY): Payer: Self-pay | Admitting: *Deleted

## 2014-09-12 ENCOUNTER — Inpatient Hospital Stay (HOSPITAL_COMMUNITY)
Admission: EM | Admit: 2014-09-12 | Discharge: 2014-09-16 | DRG: 552 | Disposition: A | Discharge status: Skilled Nursing Facility | Payer: Medicare Other | Attending: Surgery | Admitting: Surgery

## 2014-09-12 DIAGNOSIS — R0902 Hypoxemia: Secondary | ICD-10-CM | POA: Diagnosis present

## 2014-09-12 DIAGNOSIS — S2241XA Multiple fractures of ribs, right side, initial encounter for closed fracture: Secondary | ICD-10-CM | POA: Diagnosis present

## 2014-09-12 DIAGNOSIS — S2231XA Fracture of one rib, right side, initial encounter for closed fracture: Secondary | ICD-10-CM

## 2014-09-12 DIAGNOSIS — I480 Paroxysmal atrial fibrillation: Secondary | ICD-10-CM | POA: Diagnosis present

## 2014-09-12 DIAGNOSIS — Z950 Presence of cardiac pacemaker: Secondary | ICD-10-CM | POA: Diagnosis not present

## 2014-09-12 DIAGNOSIS — S22088A Other fracture of T11-T12 vertebra, initial encounter for closed fracture: Secondary | ICD-10-CM | POA: Diagnosis present

## 2014-09-12 DIAGNOSIS — F0390 Unspecified dementia without behavioral disturbance: Secondary | ICD-10-CM | POA: Diagnosis present

## 2014-09-12 DIAGNOSIS — Z7901 Long term (current) use of anticoagulants: Secondary | ICD-10-CM

## 2014-09-12 DIAGNOSIS — J439 Emphysema, unspecified: Secondary | ICD-10-CM | POA: Diagnosis present

## 2014-09-12 DIAGNOSIS — W109XXA Fall (on) (from) unspecified stairs and steps, initial encounter: Secondary | ICD-10-CM | POA: Diagnosis present

## 2014-09-12 DIAGNOSIS — I5032 Chronic diastolic (congestive) heart failure: Secondary | ICD-10-CM | POA: Diagnosis present

## 2014-09-12 DIAGNOSIS — K429 Umbilical hernia without obstruction or gangrene: Secondary | ICD-10-CM | POA: Diagnosis present

## 2014-09-12 DIAGNOSIS — D62 Acute posthemorrhagic anemia: Secondary | ICD-10-CM | POA: Diagnosis present

## 2014-09-12 DIAGNOSIS — Z79899 Other long term (current) drug therapy: Secondary | ICD-10-CM

## 2014-09-12 DIAGNOSIS — W19XXXA Unspecified fall, initial encounter: Secondary | ICD-10-CM | POA: Diagnosis present

## 2014-09-12 DIAGNOSIS — F17210 Nicotine dependence, cigarettes, uncomplicated: Secondary | ICD-10-CM | POA: Diagnosis present

## 2014-09-12 DIAGNOSIS — Z7951 Long term (current) use of inhaled steroids: Secondary | ICD-10-CM

## 2014-09-12 DIAGNOSIS — S22089A Unspecified fracture of T11-T12 vertebra, initial encounter for closed fracture: Secondary | ICD-10-CM | POA: Diagnosis present

## 2014-09-12 DIAGNOSIS — S12100A Unspecified displaced fracture of second cervical vertebra, initial encounter for closed fracture: Principal | ICD-10-CM | POA: Diagnosis present

## 2014-09-12 DIAGNOSIS — M542 Cervicalgia: Secondary | ICD-10-CM | POA: Diagnosis not present

## 2014-09-12 DIAGNOSIS — S0990XA Unspecified injury of head, initial encounter: Secondary | ICD-10-CM

## 2014-09-12 LAB — CBC WITH DIFFERENTIAL/PLATELET
Basophils Absolute: 0 10*3/uL (ref 0.0–0.1)
Basophils Relative: 0 % (ref 0–1)
Eosinophils Absolute: 0 10*3/uL (ref 0.0–0.7)
Eosinophils Relative: 0 % (ref 0–5)
HCT: 40.9 % (ref 36.0–46.0)
Hemoglobin: 12.9 g/dL (ref 12.0–15.0)
Lymphocytes Relative: 8 % — ABNORMAL LOW (ref 12–46)
Lymphs Abs: 1 10*3/uL (ref 0.7–4.0)
MCH: 28.5 pg (ref 26.0–34.0)
MCHC: 31.5 g/dL (ref 30.0–36.0)
MCV: 90.3 fL (ref 78.0–100.0)
MONO ABS: 0.5 10*3/uL (ref 0.1–1.0)
Monocytes Relative: 5 % (ref 3–12)
NEUTROS PCT: 87 % — AB (ref 43–77)
Neutro Abs: 10.2 10*3/uL — ABNORMAL HIGH (ref 1.7–7.7)
Platelets: 171 10*3/uL (ref 150–400)
RBC: 4.53 MIL/uL (ref 3.87–5.11)
RDW: 13.6 % (ref 11.5–15.5)
WBC: 11.7 10*3/uL — ABNORMAL HIGH (ref 4.0–10.5)

## 2014-09-12 LAB — COMPREHENSIVE METABOLIC PANEL
ALT: 19 U/L (ref 0–35)
AST: 27 U/L (ref 0–37)
Albumin: 3.6 g/dL (ref 3.5–5.2)
Alkaline Phosphatase: 151 U/L — ABNORMAL HIGH (ref 39–117)
Anion gap: 8 (ref 5–15)
BUN: 11 mg/dL (ref 6–23)
CHLORIDE: 105 mmol/L (ref 96–112)
CO2: 27 mmol/L (ref 19–32)
CREATININE: 0.93 mg/dL (ref 0.50–1.10)
Calcium: 8.6 mg/dL (ref 8.4–10.5)
GFR calc Af Amer: 65 mL/min — ABNORMAL LOW (ref >90)
GFR, EST NON AFRICAN AMERICAN: 56 mL/min — AB (ref >90)
Glucose, Bld: 119 mg/dL — ABNORMAL HIGH (ref 70–99)
Potassium: 4.4 mmol/L (ref 3.5–5.1)
Sodium: 140 mmol/L (ref 135–145)
Total Bilirubin: 1.1 mg/dL (ref 0.3–1.2)
Total Protein: 6.7 g/dL (ref 6.0–8.3)

## 2014-09-12 LAB — ETHANOL: Alcohol, Ethyl (B): <5 mg/dL (ref 0–9)

## 2014-09-12 LAB — I-STAT CHEM 8, ED
BUN: 16 mg/dL (ref 6–23)
CALCIUM ION: 1.09 mmol/L — AB (ref 1.13–1.30)
CHLORIDE: 103 mmol/L (ref 96–112)
CREATININE: 0.9 mg/dL (ref 0.50–1.10)
Glucose, Bld: 118 mg/dL — ABNORMAL HIGH (ref 70–99)
HCT: 44 % (ref 36.0–46.0)
Hemoglobin: 15 g/dL (ref 12.0–15.0)
Potassium: 4.4 mmol/L (ref 3.5–5.1)
Sodium: 140 mmol/L (ref 135–145)
TCO2: 25 mmol/L (ref 0–100)

## 2014-09-12 LAB — URINALYSIS, ROUTINE W REFLEX MICROSCOPIC
Glucose, UA: NEGATIVE mg/dL
Ketones, ur: 15 mg/dL — AB
NITRITE: NEGATIVE
PH: 5.5 (ref 5.0–8.0)
Protein, ur: 100 mg/dL — AB
Specific Gravity, Urine: >1.046 — ABNORMAL HIGH (ref 1.005–1.030)
UROBILINOGEN UA: 1 mg/dL (ref 0.0–1.0)

## 2014-09-12 LAB — CDS SEROLOGY

## 2014-09-12 LAB — ABO/RH: ABO/RH(D): A POS

## 2014-09-12 LAB — URINE MICROSCOPIC-ADD ON

## 2014-09-12 LAB — TYPE AND SCREEN
ABO/RH(D): A POS
Antibody Screen: NEGATIVE

## 2014-09-12 LAB — PROTIME-INR
INR: 2.43 — AB (ref 0.00–1.49)
Prothrombin Time: 26.6 seconds — ABNORMAL HIGH (ref 11.6–15.2)

## 2014-09-12 MED ORDER — DOCUSATE SODIUM 100 MG PO CAPS
200.0000 mg | ORAL_CAPSULE | Freq: Two times a day (BID) | ORAL | Status: DC
  Administered 2014-09-12 – 2014-09-16 (×8): 200 mg via ORAL
  Filled 2014-09-12 (×7): qty 2

## 2014-09-12 MED ORDER — ALBUTEROL SULFATE (2.5 MG/3ML) 0.083% IN NEBU
3.0000 mL | INHALATION_SOLUTION | Freq: Four times a day (QID) | RESPIRATORY_TRACT | Status: DC | PRN
  Administered 2014-09-15 – 2014-09-16 (×2): 3 mL via RESPIRATORY_TRACT
  Filled 2014-09-12 (×2): qty 3

## 2014-09-12 MED ORDER — POLYETHYLENE GLYCOL 3350 17 G PO PACK
17.0000 g | PACK | Freq: Every day | ORAL | Status: DC
  Administered 2014-09-12 – 2014-09-16 (×3): 17 g via ORAL
  Filled 2014-09-12 (×5): qty 1

## 2014-09-12 MED ORDER — ENSURE COMPLETE PO LIQD
237.0000 mL | Freq: Two times a day (BID) | ORAL | Status: DC
  Administered 2014-09-13: 237 mL via ORAL

## 2014-09-12 MED ORDER — ONDANSETRON HCL 4 MG/2ML IJ SOLN: 4.0000 mg | Freq: Four times a day (QID) | INTRAMUSCULAR | Status: DC | PRN

## 2014-09-12 MED ORDER — TRAMADOL HCL 50 MG PO TABS
50.0000 mg | ORAL_TABLET | Freq: Four times a day (QID) | ORAL | Status: DC | PRN
  Administered 2014-09-12 – 2014-09-13 (×2): 100 mg via ORAL
  Administered 2014-09-15: 50 mg via ORAL
  Filled 2014-09-12: qty 1
  Filled 2014-09-12 (×3): qty 2

## 2014-09-12 MED ORDER — FENTANYL CITRATE 0.05 MG/ML IJ SOLN
INTRAMUSCULAR | Status: AC | PRN
  Administered 2014-09-12: 25 ug via INTRAVENOUS

## 2014-09-12 MED ORDER — POTASSIUM CHLORIDE IN NACL 20-0.45 MEQ/L-% IV SOLN
INTRAVENOUS | Status: DC
  Administered 2014-09-12: 23:00:00 via INTRAVENOUS
  Filled 2014-09-12 (×3): qty 1000

## 2014-09-12 MED ORDER — ESCITALOPRAM OXALATE 10 MG PO TABS
20.0000 mg | ORAL_TABLET | Freq: Every day | ORAL | Status: DC
  Administered 2014-09-12 – 2014-09-16 (×5): 20 mg via ORAL
  Filled 2014-09-12: qty 1
  Filled 2014-09-12: qty 2
  Filled 2014-09-12 (×4): qty 1

## 2014-09-12 MED ORDER — CETYLPYRIDINIUM CHLORIDE 0.05 % MT LIQD
7.0000 mL | Freq: Two times a day (BID) | OROMUCOSAL | Status: DC
  Administered 2014-09-12 – 2014-09-16 (×8): 7 mL via OROMUCOSAL

## 2014-09-12 MED ORDER — SODIUM CHLORIDE 0.9 % IV SOLN
INTRAVENOUS | Status: AC | PRN
  Administered 2014-09-12: 1000 mL via INTRAVENOUS

## 2014-09-12 MED ORDER — IOHEXOL 300 MG/ML  SOLN
80.0000 mL | Freq: Once | INTRAMUSCULAR | Status: AC | PRN
  Administered 2014-09-12: 80 mL via INTRAVENOUS

## 2014-09-12 MED ORDER — BUDESONIDE-FORMOTEROL FUMARATE 160-4.5 MCG/ACT IN AERO
2.0000 | INHALATION_SPRAY | Freq: Two times a day (BID) | RESPIRATORY_TRACT | Status: DC
  Administered 2014-09-12 – 2014-09-16 (×8): 2 via RESPIRATORY_TRACT
  Filled 2014-09-12: qty 6

## 2014-09-12 MED ORDER — FENTANYL CITRATE 0.05 MG/ML IJ SOLN
INTRAMUSCULAR | Status: AC
  Filled 2014-09-12: qty 2

## 2014-09-12 MED ORDER — ONDANSETRON HCL 4 MG PO TABS: 4.0000 mg | ORAL_TABLET | Freq: Four times a day (QID) | ORAL | Status: DC | PRN

## 2014-09-12 MED ORDER — MORPHINE SULFATE 2 MG/ML IJ SOLN: 2.0000 mg | INTRAMUSCULAR | Status: DC | PRN

## 2014-09-12 MED ORDER — PANTOPRAZOLE SODIUM 40 MG PO TBEC
40.0000 mg | DELAYED_RELEASE_TABLET | Freq: Every day | ORAL | Status: DC
  Filled 2014-09-12: qty 1

## 2014-09-12 MED ORDER — PANTOPRAZOLE SODIUM 40 MG IV SOLR
40.0000 mg | Freq: Every day | INTRAVENOUS | Status: DC
  Administered 2014-09-12 – 2014-09-13 (×2): 40 mg via INTRAVENOUS
  Filled 2014-09-12 (×2): qty 40

## 2014-09-12 NOTE — ED Notes (Signed)
In and out cathed pt with assistance from Crecencio McNikki S, Charity fundraiserN; family stepped out of room; pt tolerated well; specimen sent to lab for testing

## 2014-09-12 NOTE — Consult Note (Signed)
Reason for Consult: Status post fall with C2 and T11 fractures Referring Physician: Trauma  [name] is an 81 y.o. female.  HPI: 81 year old female with a fall from standing height earlier this morning. Fall was witnessed and presumed to be mechanical in nature. No loss of consciousness. Patient complained of neck and back pain. No complaints of numbness, tingling or weakness. No obvious other injuries. No evidence of hemodynamic instability.  Past Medical History  Diagnosis Date  . Tobacco abuse   . Paroxysmal atrial fibrillation     AICD/pacemaker  . Pulmonary nodule   . COPD (chronic obstructive pulmonary disease)     Spirometry 06/10/05, FEV1 46% and ratio 51% with 14% better after albuterol    Past Surgical History  Procedure Laterality Date  . Pacemaker insertion    . Soft tissue tumor resection      From right forearm  . Skin graft    . Biv pacemaker generator change out N/A 07/28/2014    Procedure: BIV PACEMAKER GENERATOR CHANGE OUT;  Surgeon: Evans Lance, MD;  Location: [facility] CATH LAB;  Service: Cardiovascular;  Laterality: N/A;    Family History  Problem Relation Age of Onset  . Other Neg Hx     Respiratory disease    Social History:  reports that she has been smoking.  She has never used smokeless tobacco. She reports that she does not drink alcohol. Her drug history is not on file.  Allergies: No Known Allergies  Medications:   Results for orders placed or performed during the hospital encounter of 09/12/14 (from the past 48 hour(s))  CDS serology     Status: None   Collection Time: 09/12/14  9:14 AM  Result Value Ref Range   CDS serology specimen STAT   Comprehensive metabolic panel     Status: Abnormal   Collection Time: 09/12/14  9:14 AM  Result Value Ref Range   Sodium 140 135 - 145 mmol/L   Potassium 4.4 3.5 - 5.1 mmol/L   Chloride 105 96 - 112 mmol/L   CO2 27 19 - 32 mmol/L   Glucose, Bld 119 (H) 70 - 99 mg/dL   BUN 11 6 - 23 mg/dL    Creatinine, Ser 0.93 0.50 - 1.10 mg/dL   Calcium 8.6 8.4 - 10.5 mg/dL   Total Protein 6.7 6.0 - 8.3 g/dL   Albumin 3.6 3.5 - 5.2 g/dL   AST 27 0 - 37 U/L   ALT 19 0 - 35 U/L   Alkaline Phosphatase 151 (H) 39 - 117 U/L   Total Bilirubin 1.1 0.3 - 1.2 mg/dL   GFR calc non Af Amer 56 (L) >90 mL/min   GFR calc Af Amer 65 (L) >90 mL/min    Comment: (NOTE) The eGFR has been calculated using the CKD EPI equation. This calculation has not been validated in all clinical situations. eGFR's persistently <90 mL/min signify possible Chronic Kidney Disease.    Anion gap 8 5 - 15  Ethanol     Status: None   Collection Time: 09/12/14  9:14 AM  Result Value Ref Range   Alcohol, Ethyl (B) <5 0 - 9 mg/dL    Comment:        LOWEST DETECTABLE LIMIT FOR SERUM ALCOHOL IS 11 mg/dL FOR MEDICAL PURPOSES ONLY   Protime-INR     Status: Abnormal   Collection Time: 09/12/14  9:14 AM  Result Value Ref Range   Prothrombin Time 26.6 (H) 11.6 - 15.2 seconds  INR 2.43 (H) 0.00 - 1.49  CBC WITH DIFFERENTIAL     Status: Abnormal   Collection Time: 09/12/14  9:14 AM  Result Value Ref Range   WBC 11.7 (H) 4.0 - 10.5 K/uL   RBC 4.53 3.87 - 5.11 MIL/uL   Hemoglobin 12.9 12.0 - 15.0 g/dL   HCT 40.9 36.0 - 46.0 %   MCV 90.3 78.0 - 100.0 fL   MCH 28.5 26.0 - 34.0 pg   MCHC 31.5 30.0 - 36.0 g/dL   RDW 13.6 11.5 - 15.5 %   Platelets 171 150 - 400 K/uL   Neutrophils Relative % 87 (H) 43 - 77 %   Neutro Abs 10.2 (H) 1.7 - 7.7 K/uL   Lymphocytes Relative 8 (L) 12 - 46 %   Lymphs Abs 1.0 0.7 - 4.0 K/uL   Monocytes Relative 5 3 - 12 %   Monocytes Absolute 0.5 0.1 - 1.0 K/uL   Eosinophils Relative 0 0 - 5 %   Eosinophils Absolute 0.0 0.0 - 0.7 K/uL   Basophils Relative 0 0 - 1 %   Basophils Absolute 0.0 0.0 - 0.1 K/uL  Type and screen     Status: None   Collection Time: 09/12/14  9:15 AM  Result Value Ref Range   ABO/RH(D) A POS    Antibody Screen NEG    Sample Expiration 09/15/2014   ABO/Rh     Status: None    Collection Time: 09/12/14  9:15 AM  Result Value Ref Range   ABO/RH(D) A POS   I-Stat Chem 8, ED     Status: Abnormal   Collection Time: 09/12/14  9:25 AM  Result Value Ref Range   Sodium 140 135 - 145 mmol/L   Potassium 4.4 3.5 - 5.1 mmol/L   Chloride 103 96 - 112 mmol/L   BUN 16 6 - 23 mg/dL   Creatinine, Ser 0.90 0.50 - 1.10 mg/dL   Glucose, Bld 118 (H) 70 - 99 mg/dL   Calcium, Ion 1.09 (L) 1.13 - 1.30 mmol/L   TCO2 25 0 - 100 mmol/L   Hemoglobin 15.0 12.0 - 15.0 g/dL   HCT 44.0 36.0 - 46.0 %  Urinalysis, Routine w reflex microscopic     Status: Abnormal   Collection Time: 09/12/14 10:17 AM  Result Value Ref Range   Color, Urine BROWN (A) YELLOW    Comment: BIOCHEMICALS MAY BE AFFECTED BY COLOR   APPearance TURBID (A) CLEAR   Specific Gravity, Urine >1.046 (H) 1.005 - 1.030   pH 5.5 5.0 - 8.0   Glucose, UA NEGATIVE NEGATIVE mg/dL   Hgb urine dipstick LARGE (A) NEGATIVE   Bilirubin Urine MODERATE (A) NEGATIVE   Ketones, ur 15 (A) NEGATIVE mg/dL   Protein, ur 100 (A) NEGATIVE mg/dL   Urobilinogen, UA 1.0 0.0 - 1.0 mg/dL   Nitrite NEGATIVE NEGATIVE   Leukocytes, UA SMALL (A) NEGATIVE  Urine microscopic-add on     Status: Abnormal   Collection Time: 09/12/14 10:17 AM  Result Value Ref Range   Squamous Epithelial / LPF RARE RARE   WBC, UA 3-6 <3 WBC/hpf   RBC / HPF TOO NUMEROUS TO COUNT <3 RBC/hpf   Bacteria, UA RARE RARE   Casts GRANULAR CAST (A) NEGATIVE    Comment: HYALINE CASTS    Ct Head Wo Contrast  09/12/2014   CLINICAL DATA:  Head injury and hematoma after fall down steps. No report of loss of consciousness.  EXAM: CT HEAD WITHOUT CONTRAST  CT  CERVICAL SPINE WITHOUT CONTRAST  TECHNIQUE: Multidetector CT imaging of the head and cervical spine was performed following the standard protocol without intravenous contrast. Multiplanar CT image reconstructions of the cervical spine were also generated.  COMPARISON:  None.  FINDINGS: CT HEAD FINDINGS  Bony calvarium  appears intact. Mild diffuse cortical atrophy is noted. Mild chronic ischemic white matter disease is noted. No mass effect or midline shift is noted. Ventricular size is within normal limits. There is no evidence of mass lesion or hemorrhage. Low density is noted in the right cerebellar hemisphere concerning for infarction of indeterminate age.  CT CERVICAL SPINE FINDINGS  Mildly displaced fracture is seen involving the right side of the C2 vertebral body which extends into vertebral artery foramen. There also appears to be a nondisplaced fracture involving the left side of the C2 vertebral body at the base of the dens. There appears to be malalignment or some degree of rotary subluxation of C1-2 which may be traumatic in origin. Degenerative disc disease is noted at C3-4, C4-5, C5-6 and C6-7.  IMPRESSION: Mild diffuse cortical atrophy. Mild chronic ischemic white matter disease. Low density is noted in right cerebellar hemisphere concerning for infarction of indeterminate age. MRI may be performed for further evaluation.  Mildly displaced fracture is seen involving the right side of the C2 vertebral body which extends into the vertebral artery foramen. There also appears to be a nondisplaced fracture involving the left side of the C2 vertebral body at the base of the dens. Malalignment or rotary subluxation of the atlantoaxial joint is noted which may be traumatic in origin. Critical Value/emergent results were called by telephone at the time of interpretation on 09/12/2014 at 10:12 am to Dr. Elnora Morrison , who verbally acknowledged these results.   Electronically Signed   By: Marijo Conception, M.D.   On: 09/12/2014 10:15   Ct Chest W Contrast  09/12/2014   CLINICAL DATA:  Multiple trauma after falling down several steps. Scalp hematoma. Back pain.  EXAM: CT CHEST, ABDOMEN, AND PELVIS WITH CONTRAST  TECHNIQUE: Multidetector CT imaging of the chest, abdomen and pelvis was performed following the standard protocol  during bolus administration of intravenous contrast.  CONTRAST:  67mL OMNIPAQUE IOHEXOL 300 MG/ML  SOLN  COMPARISON:  Radiographs dated 02/29/2015  FINDINGS: CT CHEST FINDINGS  There is an acute compression fracture of the T11 vertebral body with 4 mm of protrusion of the posterior aspect of T11 into the spinal canal.  There are multiple old bilateral rib fractures. There are fractures of the lateral aspects of the right eighth and ninth ribs which could be acute. There is no pneumothorax. There are patchy areas of infiltrate or atelectasis at the left lung base.  There are multiple small pulmonary nodules. The nodules on the right are unchanged. There is a new 3 mm nodule in the left lower lobe on image 26 of series 202. There is a stable 4 mm nodule in the left lower lobe on image 27 of 2 O2. There is a new 3 mm nodule on image 39.  There is extensive calcification in the thoracic aorta. Overall heart size is normal. Pacemaker in place.  There is a small left pleural effusion and a tiny right effusion.  CT ABDOMEN AND PELVIS FINDINGS  Liver, spleen, pancreas, and right adrenal gland are normal. There is an inhomogeneous 3.3 x 1.8 x 1.5 cm lesion in the left adrenal gland, minimally increased in size since 2007.  There is a 3.7  x 3.6 x 3.5 cm angiomyolipoma arising from the posterior aspect of the mid right kidney. There are several small cysts in the right kidney. There are also several cysts in the left kidney with the largest being 3.1 in the upper pole.  The bowel is normal except for an umbilical hernia containing a small portion of the transverse colon and fecal impaction in the rectum.  The bladder, uterus, and ovaries are normal except for calcifications in the uterus.  There is multilevel degenerative disc and joint disease in the lumbar spine with moderate spinal stenosis at L4-5 due to spondylolisthesis.  IMPRESSION:: IMPRESSION: 1. Moderate acute compression fracture of T11 with slight protrusion of  bone into the spinal canal. 2. Possible acute fractures of the lateral aspects of the right eighth and ninth ribs. 3. Patchy areas of atelectasis or infiltrate at the left lung base. Multiple pulmonary nodules, most likely benign and most of which are stable. 4. Chronic mass in the left adrenal gland, minimally progressed since the prior study and probably representing an adenoma. 5. 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   Electronically Signed   By: Lorriane Shire M.D.   On: 09/12/2014 10:28   Ct Cervical Spine Wo Contrast  09/12/2014   CLINICAL DATA:  Head injury and hematoma after fall down steps. No report of loss of consciousness.  EXAM: CT HEAD WITHOUT CONTRAST  CT CERVICAL SPINE WITHOUT CONTRAST  TECHNIQUE: Multidetector CT imaging of the head and cervical spine was performed following the standard protocol without intravenous contrast. Multiplanar CT image reconstructions of the cervical spine were also generated.  COMPARISON:  None.  FINDINGS: CT HEAD FINDINGS  Bony calvarium appears intact. Mild diffuse cortical atrophy is noted. Mild chronic ischemic white matter disease is noted. No mass effect or midline shift is noted. Ventricular size is within normal limits. There is no evidence of mass lesion or hemorrhage. Low density is noted in the right cerebellar hemisphere concerning for infarction of indeterminate age.  CT CERVICAL SPINE FINDINGS  Mildly displaced fracture is seen involving the right side of the C2 vertebral body which extends into vertebral artery foramen. There also appears to be a nondisplaced fracture involving the left side of the C2 vertebral body at the base of the dens. There appears to be malalignment or some degree of rotary subluxation of C1-2 which may be traumatic in origin. Degenerative disc disease is noted at C3-4, C4-5, C5-6 and C6-7.  IMPRESSION: Mild diffuse cortical atrophy. Mild chronic ischemic white matter disease. Low density is noted in right  cerebellar hemisphere concerning for infarction of indeterminate age. MRI may be performed for further evaluation.  Mildly displaced fracture is seen involving the right side of the C2 vertebral body which extends into the vertebral artery foramen. There also appears to be a nondisplaced fracture involving the left side of the C2 vertebral body at the base of the dens. Malalignment or rotary subluxation of the atlantoaxial joint is noted which may be traumatic in origin. Critical Value/emergent results were called by telephone at the time of interpretation on 09/12/2014 at 10:12 am to Dr. Elnora Morrison , who verbally acknowledged these results.   Electronically Signed   By: Marijo Conception, M.D.   On: 09/12/2014 10:15   Ct Abdomen Pelvis W Contrast  09/12/2014   CLINICAL DATA:  Multiple trauma after falling down several steps. Scalp hematoma. Back pain.  EXAM: CT CHEST, ABDOMEN, AND PELVIS WITH CONTRAST  TECHNIQUE: Multidetector  CT imaging of the chest, abdomen and pelvis was performed following the standard protocol during bolus administration of intravenous contrast.  CONTRAST:  80mL OMNIPAQUE IOHEXOL 300 MG/ML  SOLN  COMPARISON:  Radiographs dated 02/29/2015  FINDINGS: CT CHEST FINDINGS  There is an acute compression fracture of the T11 vertebral body with 4 mm of protrusion of the posterior aspect of T11 into the spinal canal.  There are multiple old bilateral rib fractures. There are fractures of the lateral aspects of the right eighth and ninth ribs which could be acute. There is no pneumothorax. There are patchy areas of infiltrate or atelectasis at the left lung base.  There are multiple small pulmonary nodules. The nodules on the right are unchanged. There is a new 3 mm nodule in the left lower lobe on image 26 of series 202. There is a stable 4 mm nodule in the left lower lobe on image 27 of 2 O2. There is a new 3 mm nodule on image 39.  There is extensive calcification in the thoracic aorta. Overall  heart size is normal. Pacemaker in place.  There is a small left pleural effusion and a tiny right effusion.  CT ABDOMEN AND PELVIS FINDINGS  Liver, spleen, pancreas, and right adrenal gland are normal. There is an inhomogeneous 3.3 x 1.8 x 1.5 cm lesion in the left adrenal gland, minimally increased in size since 2007.  There is a 3.7 x 3.6 x 3.5 cm angiomyolipoma arising from the posterior aspect of the mid right kidney. There are several small cysts in the right kidney. There are also several cysts in the left kidney with the largest being 3.1 in the upper pole.  The bowel is normal except for an umbilical hernia containing a small portion of the transverse colon and fecal impaction in the rectum.  The bladder, uterus, and ovaries are normal except for calcifications in the uterus.  There is multilevel degenerative disc and joint disease in the lumbar spine with moderate spinal stenosis at L4-5 due to spondylolisthesis.  IMPRESSION:: IMPRESSION: 1. Moderate acute compression fracture of T11 with slight protrusion of bone into the spinal canal. 2. Possible acute fractures of the lateral aspects of the right eighth and ninth ribs. 3. Patchy areas of atelectasis or infiltrate at the left lung base. Multiple pulmonary nodules, most likely benign and most of which are stable. 4. Chronic mass in the left adrenal gland, minimally progressed since the prior study and probably representing an adenoma. 5. 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   Electronically Signed   By: Lorriane Shire M.D.   On: 09/12/2014 10:28   Dg Pelvis Portable  09/12/2014   CLINICAL DATA:  Acute pelvic pain after fall this morning. Initial encounter.  EXAM: PORTABLE PELVIS 1-2 VIEWS  COMPARISON:  None.  FINDINGS: There is no evidence of pelvic fracture or diastasis. No pelvic bone lesions are seen. Hip and sacroiliac joints appear normal.  IMPRESSION: Normal pelvis.   Electronically Signed   By: Marijo Conception, M.D.   On:  09/12/2014 09:19   Dg Chest Port 1 View  09/12/2014   CLINICAL DATA:  Patient fell earlier today with right-sided chest pain  EXAM: PORTABLE CHEST - 1 VIEW  COMPARISON:  December 14, 2012  FINDINGS: Evidence of a degree of underlying emphysema is stable. There is no edema or consolidation. Heart is upper normal in size with pulmonary vascularity within normal limits. Pacemaker leads are attached to the right atrium  and right ventricle. No adenopathy. No pneumothorax. There is a fracture of the right posterior seventh rib. There are fractures of the antrerolateral seventh and eighth ribs.  IMPRESSION: Rib fractures on the right without pneumothorax or effusion. Underlying emphysematous change. No edema or consolidation.   Electronically Signed   By: Lowella Grip III M.D.   On: 09/12/2014 09:18     Blood pressure 148/68, pulse 67, temperature 98.7 F (37.1 C), temperature source Oral, resp. rate 25, height $RemoveBe'5\' 2"'[id number]$  (1.575 m), weight 57.561 kg (126 lb 14.4 oz), SpO2 98 %. The patient is awake and alert. She is oriented and reasonably appropriate. Her speech is fluent. Her judgment and insight appear intact. Examination head ears eyes and throat demonstrates a mild surface abrasions and some swelling. No evidence of laceration or bony abnormality. Oropharynx nasopharynx and external auditory canals clear bilaterally. Cervical spine with a somewhat flexed and rotated toward the left position. Moderate upper cervical tenderness. Airway midline. Chest clear to auscultation. Cardiac exam unremarkable. Moderate lower thoracic tenderness. No lumbar tenderness or bony abnormality. Examination of her extremities reveal normal strength and sensation bilaterally. Reflexes hypoactive but symmetric.  Assessment/Plan: Patient has 2 significant problems. #1 the patient has evidence of a mild-to-moderate compression fracture of T11. Given her size and age I don't think that she will deeply well with a TLSO. I would like to  try to mobilize her without a brace. If she has a great difficulty difficulty with pain or other problems then we will work towards getting her brace. With regard to her cervical spine she densely has some element of rotatory subluxation with a nondisplaced fracture superimposed upon chronic degenerative changes. Some degree of her postural abnormality was present pre-accident according to family. I would recommend continued rigid cervical collar. She will need this for at least 6 weeks.  Silver Achey A 09/12/2014, 6:24 PM

## 2014-09-12 NOTE — H&P (Signed)
[name] is an 81 y.o. female.   Chief Complaint: Fall HPI: [name] suffered a mechanical fall down several steps. She denied loss of consciousness. She was brought to the ED and made a level 2 by the EDP because of some hypoxia. That recovered with some oxygen though she remained a bit tachypneic. She c/o head pain and lower back pain.  Past Medical History  Diagnosis Date  . Tobacco abuse   . Paroxysmal atrial fibrillation     AICD/pacemaker  . Pulmonary nodule   . COPD (chronic obstructive pulmonary disease)     Spirometry 06/10/05, FEV1 46% and ratio 51% with 14% better after albuterol    Past Surgical History  Procedure Laterality Date  . Pacemaker insertion    . Soft tissue tumor resection      From right forearm  . Skin graft    . Biv pacemaker generator change out N/A 07/28/2014    Procedure: BIV PACEMAKER GENERATOR CHANGE OUT;  Surgeon: Evans Lance, MD;  Location: [facility] CATH LAB;  Service: Cardiovascular;  Laterality: N/A;    Family History  Problem Relation Age of Onset  . Other Neg Hx     Respiratory disease   Social History:  reports that she has been smoking.  She has never used smokeless tobacco. She reports that she does not drink alcohol. Her drug history is not on file.  Allergies: No Known Allergies  Results for orders placed or performed during the hospital encounter of 09/12/14 (from the past 48 hour(s))  CDS serology     Status: None   Collection Time: 09/12/14  9:14 AM  Result Value Ref Range   CDS serology specimen STAT   Comprehensive metabolic panel     Status: Abnormal   Collection Time: 09/12/14  9:14 AM  Result Value Ref Range   Sodium 140 135 - 145 mmol/L   Potassium 4.4 3.5 - 5.1 mmol/L   Chloride 105 96 - 112 mmol/L   CO2 27 19 - 32 mmol/L   Glucose, Bld 119 (H) 70 - 99 mg/dL   BUN 11 6 - 23 mg/dL   Creatinine, Ser 0.93 0.50 - 1.10 mg/dL   Calcium 8.6 8.4 - 10.5 mg/dL   Total Protein 6.7 6.0 - 8.3 g/dL   Albumin 3.6 3.5 - 5.2  g/dL   AST 27 0 - 37 U/L   ALT 19 0 - 35 U/L   Alkaline Phosphatase 151 (H) 39 - 117 U/L   Total Bilirubin 1.1 0.3 - 1.2 mg/dL   GFR calc non Af Amer 56 (L) >90 mL/min   GFR calc Af Amer 65 (L) >90 mL/min    Comment: (NOTE) The eGFR has been calculated using the CKD EPI equation. This calculation has not been validated in all clinical situations. eGFR's persistently <90 mL/min signify possible Chronic Kidney Disease.    Anion gap 8 5 - 15  Ethanol     Status: None   Collection Time: 09/12/14  9:14 AM  Result Value Ref Range   Alcohol, Ethyl (B) <5 0 - 9 mg/dL    Comment:        LOWEST DETECTABLE LIMIT FOR SERUM ALCOHOL IS 11 mg/dL FOR MEDICAL PURPOSES ONLY   Protime-INR     Status: Abnormal   Collection Time: 09/12/14  9:14 AM  Result Value Ref Range   Prothrombin Time 26.6 (H) 11.6 - 15.2 seconds   INR 2.43 (H) 0.00 - 1.49  CBC WITH DIFFERENTIAL  Status: Abnormal   Collection Time: 09/12/14  9:14 AM  Result Value Ref Range   WBC 11.7 (H) 4.0 - 10.5 K/uL   RBC 4.53 3.87 - 5.11 MIL/uL   Hemoglobin 12.9 12.0 - 15.0 g/dL   HCT 40.9 36.0 - 46.0 %   MCV 90.3 78.0 - 100.0 fL   MCH 28.5 26.0 - 34.0 pg   MCHC 31.5 30.0 - 36.0 g/dL   RDW 13.6 11.5 - 15.5 %   Platelets 171 150 - 400 K/uL   Neutrophils Relative % 87 (H) 43 - 77 %   Neutro Abs 10.2 (H) 1.7 - 7.7 K/uL   Lymphocytes Relative 8 (L) 12 - 46 %   Lymphs Abs 1.0 0.7 - 4.0 K/uL   Monocytes Relative 5 3 - 12 %   Monocytes Absolute 0.5 0.1 - 1.0 K/uL   Eosinophils Relative 0 0 - 5 %   Eosinophils Absolute 0.0 0.0 - 0.7 K/uL   Basophils Relative 0 0 - 1 %   Basophils Absolute 0.0 0.0 - 0.1 K/uL  Type and screen     Status: None   Collection Time: 09/12/14  9:15 AM  Result Value Ref Range   ABO/RH(D) A POS    Antibody Screen NEG    Sample Expiration 09/15/2014   I-Stat Chem 8, ED     Status: Abnormal   Collection Time: 09/12/14  9:25 AM  Result Value Ref Range   Sodium 140 135 - 145 mmol/L   Potassium 4.4 3.5 -  5.1 mmol/L   Chloride 103 96 - 112 mmol/L   BUN 16 6 - 23 mg/dL   Creatinine, Ser 0.90 0.50 - 1.10 mg/dL   Glucose, Bld 118 (H) 70 - 99 mg/dL   Calcium, Ion 1.09 (L) 1.13 - 1.30 mmol/L   TCO2 25 0 - 100 mmol/L   Hemoglobin 15.0 12.0 - 15.0 g/dL   HCT 44.0 36.0 - 46.0 %   Ct Head Wo Contrast  09/12/2014   CLINICAL DATA:  Head injury and hematoma after fall down steps. No report of loss of consciousness.  EXAM: CT HEAD WITHOUT CONTRAST  CT CERVICAL SPINE WITHOUT CONTRAST  TECHNIQUE: Multidetector CT imaging of the head and cervical spine was performed following the standard protocol without intravenous contrast. Multiplanar CT image reconstructions of the cervical spine were also generated.  COMPARISON:  None.  FINDINGS: CT HEAD FINDINGS  Bony calvarium appears intact. Mild diffuse cortical atrophy is noted. Mild chronic ischemic white matter disease is noted. No mass effect or midline shift is noted. Ventricular size is within normal limits. There is no evidence of mass lesion or hemorrhage. Low density is noted in the right cerebellar hemisphere concerning for infarction of indeterminate age.  CT CERVICAL SPINE FINDINGS  Mildly displaced fracture is seen involving the right side of the C2 vertebral body which extends into vertebral artery foramen. There also appears to be a nondisplaced fracture involving the left side of the C2 vertebral body at the base of the dens. There appears to be malalignment or some degree of rotary subluxation of C1-2 which may be traumatic in origin. Degenerative disc disease is noted at C3-4, C4-5, C5-6 and C6-7.  IMPRESSION: Mild diffuse cortical atrophy. Mild chronic ischemic white matter disease. Low density is noted in right cerebellar hemisphere concerning for infarction of indeterminate age. MRI may be performed for further evaluation.  Mildly displaced fracture is seen involving the right side of the C2 vertebral body which extends into the  vertebral artery foramen.  There also appears to be a nondisplaced fracture involving the left side of the C2 vertebral body at the base of the dens. Malalignment or rotary subluxation of the atlantoaxial joint is noted which may be traumatic in origin. Critical Value/emergent results were called by telephone at the time of interpretation on 09/12/2014 at 10:12 am to Dr. Elnora Morrison , who verbally acknowledged these results.   Electronically Signed   By: Marijo Conception, M.D.   On: 09/12/2014 10:15   Ct Cervical Spine Wo Contrast  09/12/2014   CLINICAL DATA:  Head injury and hematoma after fall down steps. No report of loss of consciousness.  EXAM: CT HEAD WITHOUT CONTRAST  CT CERVICAL SPINE WITHOUT CONTRAST  TECHNIQUE: Multidetector CT imaging of the head and cervical spine was performed following the standard protocol without intravenous contrast. Multiplanar CT image reconstructions of the cervical spine were also generated.  COMPARISON:  None.  FINDINGS: CT HEAD FINDINGS  Bony calvarium appears intact. Mild diffuse cortical atrophy is noted. Mild chronic ischemic white matter disease is noted. No mass effect or midline shift is noted. Ventricular size is within normal limits. There is no evidence of mass lesion or hemorrhage. Low density is noted in the right cerebellar hemisphere concerning for infarction of indeterminate age.  CT CERVICAL SPINE FINDINGS  Mildly displaced fracture is seen involving the right side of the C2 vertebral body which extends into vertebral artery foramen. There also appears to be a nondisplaced fracture involving the left side of the C2 vertebral body at the base of the dens. There appears to be malalignment or some degree of rotary subluxation of C1-2 which may be traumatic in origin. Degenerative disc disease is noted at C3-4, C4-5, C5-6 and C6-7.  IMPRESSION: Mild diffuse cortical atrophy. Mild chronic ischemic white matter disease. Low density is noted in right cerebellar hemisphere concerning for  infarction of indeterminate age. MRI may be performed for further evaluation.  Mildly displaced fracture is seen involving the right side of the C2 vertebral body which extends into the vertebral artery foramen. There also appears to be a nondisplaced fracture involving the left side of the C2 vertebral body at the base of the dens. Malalignment or rotary subluxation of the atlantoaxial joint is noted which may be traumatic in origin. Critical Value/emergent results were called by telephone at the time of interpretation on 09/12/2014 at 10:12 am to Dr. Elnora Morrison , who verbally acknowledged these results.   Electronically Signed   By: Marijo Conception, M.D.   On: 09/12/2014 10:15   Dg Pelvis Portable  09/12/2014   CLINICAL DATA:  Acute pelvic pain after fall this morning. Initial encounter.  EXAM: PORTABLE PELVIS 1-2 VIEWS  COMPARISON:  None.  FINDINGS: There is no evidence of pelvic fracture or diastasis. No pelvic bone lesions are seen. Hip and sacroiliac joints appear normal.  IMPRESSION: Normal pelvis.   Electronically Signed   By: Marijo Conception, M.D.   On: 09/12/2014 09:19   Dg Chest Port 1 View  09/12/2014   CLINICAL DATA:  Patient fell earlier today with right-sided chest pain  EXAM: PORTABLE CHEST - 1 VIEW  COMPARISON:  December 14, 2012  FINDINGS: Evidence of a degree of underlying emphysema is stable. There is no edema or consolidation. Heart is upper normal in size with pulmonary vascularity within normal limits. Pacemaker leads are attached to the right atrium and right ventricle. No adenopathy. No pneumothorax. There is a fracture  of the right posterior seventh rib. There are fractures of the antrerolateral seventh and eighth ribs.  IMPRESSION: Rib fractures on the right without pneumothorax or effusion. Underlying emphysematous change. No edema or consolidation.   Electronically Signed   By: Lowella Grip III M.D.   On: 09/12/2014 09:18    Review of Systems  Constitutional: Negative for  weight loss.  HENT: Negative for ear discharge, ear pain, hearing loss and tinnitus.   Eyes: Negative for blurred vision, double vision, photophobia and pain.  Respiratory: Negative for cough, sputum production and shortness of breath.   Cardiovascular: Negative for chest pain.  Gastrointestinal: Negative for nausea, vomiting and abdominal pain.  Genitourinary: Negative for dysuria, urgency, frequency and flank pain.  Musculoskeletal: Positive for back pain. Negative for myalgias, joint pain, falls and neck pain.  Neurological: Positive for headaches. Negative for dizziness, tingling, sensory change, focal weakness and loss of consciousness.  Endo/Heme/Allergies: Does not bruise/bleed easily.  Psychiatric/Behavioral: Negative for depression, memory loss and substance abuse. The patient is not nervous/anxious.     Blood pressure 131/77, pulse 69, temperature 97.3 F (36.3 C), temperature source Temporal, resp. rate 23, SpO2 95 %. Physical Exam  Vitals reviewed. Constitutional: She is oriented to person, place, and time. She appears well-developed and well-nourished. She is cooperative. No distress. Cervical collar and nasal cannula in place.  HENT:  Head: Normocephalic and atraumatic. Head is without raccoon's eyes, without Battle's sign, without abrasion, without contusion and without laceration.  Right Ear: Hearing, tympanic membrane, external ear and ear canal normal. No lacerations. No drainage or tenderness. No foreign bodies. Tympanic membrane is not perforated. No hemotympanum.  Left Ear: Hearing, tympanic membrane, external ear and ear canal normal. No lacerations. No drainage or tenderness. No foreign bodies. Tympanic membrane is not perforated. No hemotympanum.  Nose: Nose normal. No nose lacerations, sinus tenderness, nasal deformity or nasal septal hematoma. No epistaxis.  Mouth/Throat: Uvula is midline, oropharynx is clear and moist and mucous membranes are normal. No lacerations.  No oropharyngeal exudate.  Eyes: Conjunctivae, EOM and lids are normal. Pupils are equal, round, and reactive to light. Right eye exhibits no discharge. Left eye exhibits no discharge. No scleral icterus.  Neck: Trachea normal. No JVD present. No spinous process tenderness and no muscular tenderness present. Carotid bruit is not present. No tracheal deviation present. No thyromegaly present.  Cardiovascular: Normal rate, regular rhythm, normal heart sounds, intact distal pulses and normal pulses.  Exam reveals no gallop and no friction rub.   No murmur heard. Respiratory: Effort normal and breath sounds normal. No stridor. No respiratory distress. She has no wheezes. She has no rales. She exhibits no tenderness, no bony tenderness, no laceration and no crepitus.  GI: Soft. Normal appearance and bowel sounds are normal. She exhibits no distension. There is no tenderness. There is no rigidity, no rebound, no guarding and no CVA tenderness.  Musculoskeletal: Normal range of motion. She exhibits no edema.       Thoracic back: She exhibits tenderness and bony tenderness.  Lymphadenopathy:    She has no cervical adenopathy.  Neurological: She is alert and oriented to person, place, and time. She has normal strength. No cranial nerve deficit or sensory deficit. GCS eye subscore is 4. GCS verbal subscore is 5. GCS motor subscore is 6.  Skin: Skin is warm, dry and intact. She is not diaphoretic.  Psychiatric: She has a normal mood and affect. Her speech is normal and behavior is normal.  Assessment/Plan Fall C2 fx -- Collar T11 fx -- No brace for now per Dr. Annette Stable Multiple medical problems -- Home meds  Admit to trauma, NS to consult    Lisette Abu, PA-C Pager: [phone] General Trauma [place] Pager: [phone] 09/12/2014, 10:27 AM

## 2014-09-12 NOTE — ED Notes (Signed)
Myself and Traci, RN undressed pt, placed in gown, on monitor, continuous pulse oximetry, blood pressure cuff and oxygen [place] (2L); warm blankets given

## 2014-09-12 NOTE — ED Provider Notes (Addendum)
CSN: [id number]     Arrival date & time 09/12/14  4098 History   First MD Initiated Contact with Patient 09/12/14 [phone]     Chief Complaint  Patient presents with  . Fall     (Consider location/radiation/quality/duration/timing/severity/associated sxs/prior Treatment) HPI Comments: 81 year old female with history of tobacco abuse atrial fibrillation, pacemaker, COPD presents after fall down 7 stairs hitting her head. Patient on Coumadin. Patient recalls events however unsure why she fell. Patient currently in the process of getting a walker. Patient has pain in the head 5-10, right flank chest and right upper abdominal pain. Patient had no unstable vitals on route. Patient is full code I clarified with her on this topic. Pain with palpation movement, c-collar in place.  Patient is a 81 y.o. female presenting with fall. The history is provided by the patient and the EMS personnel.  Fall This is a new problem. Associated symptoms include abdominal pain, headaches and shortness of breath. Pertinent negatives include no chest pain.    Past Medical History  Diagnosis Date  . Tobacco abuse   . Paroxysmal atrial fibrillation     AICD/pacemaker  . Pulmonary nodule   . COPD (chronic obstructive pulmonary disease)     Spirometry 06/10/05, FEV1 46% and ratio 51% with 14% better after albuterol   Past Surgical History  Procedure Laterality Date  . Pacemaker insertion    . Soft tissue tumor resection      From right forearm  . Skin graft    . Biv pacemaker generator change out N/A 07/28/2014    Procedure: BIV PACEMAKER GENERATOR CHANGE OUT;  Surgeon: Marinus Maw, MD;  Location: [facility] CATH LAB;  Service: Cardiovascular;  Laterality: N/A;   Family History  Problem Relation Age of Onset  . Other Neg Hx     Respiratory disease   History  Substance Use Topics  . Smoking status: Current Every Day Smoker -- 1.00 packs/day for 50 years  . Smokeless tobacco: Never Used     Comment: Counseling,  down to less than half ppd as of 03/04/11  . Alcohol Use: No   OB History    No data available     Review of Systems  Constitutional: Negative for fever and chills.  HENT: Negative for congestion.   Eyes: Negative for visual disturbance.  Respiratory: Positive for shortness of breath.   Cardiovascular: Negative for chest pain.  Gastrointestinal: Positive for abdominal pain. Negative for vomiting.  Genitourinary: Positive for flank pain. Negative for dysuria.  Musculoskeletal: Positive for back pain and arthralgias. Negative for neck pain and neck stiffness.  Skin: Negative for rash.  Neurological: Positive for headaches. Negative for light-headedness.      Allergies  Review of patient's allergies indicates no known allergies.  Home Medications   Prior to Admission medications   Medication Sig Start Date End Date Taking? Authorizing Provider  acetaminophen (TYLENOL) 500 MG tablet Take 1,000 mg by mouth 2 (two) times daily as needed (pain).   Yes Historical Provider, MD  budesonide-formoterol (SYMBICORT) 160-4.5 MCG/ACT inhaler Inhale 2 puffs into the lungs 2 (two) times daily.   Yes Historical Provider, MD  dextromethorphan-guaiFENesin (MUCINEX DM) 30-600 MG per 12 hr tablet Take 2 tablets by mouth 2 (two) times daily as needed for cough.   Yes Historical Provider, MD  escitalopram (LEXAPRO) 20 MG tablet Take 20 mg by mouth daily.   Yes Historical Provider, MD  warfarin (COUMADIN) 5 MG tablet Take 2.5-5 mg by mouth daily. 5mg  on  Monday and Wednesday and 2.5mg  all other days   Yes Historical Provider, MD  albuterol (PROVENTIL HFA;VENTOLIN HFA) 108 (90 BASE) MCG/ACT inhaler Inhale 2 puffs into the lungs every 6 (six) hours as needed for wheezing or shortness of breath.    Historical Provider, MD  PROAIR HFA 108 (90 BASE) MCG/ACT inhaler INHALE 2 PUFFS INTO THE LUNGS 4 (FOUR) TIMES DAILY AS NEEDED FOR WHEEZING. Patient not taking: Reported on 09/12/2014 01/04/14   Waymon Budge, MD   SPIRIVA HANDIHALER 18 MCG inhalation capsule PLACE 1 CAPSULE (18 MCG TOTAL) INTO INHALER AND INHALE DAILY. Patient not taking: Reported on 09/12/2014 01/04/14   Waymon Budge, MD  warfarin (COUMADIN) 5 MG tablet TAKE 1 TABLET BY MOUTH AS DIRECTED Patient not taking: Reported on 09/12/2014 07/04/14   Marinus Maw, MD   BP 132/45 mmHg  Pulse 69  Temp(Src) 97.3 F (36.3 C) (Temporal)  Resp 27  SpO2 100% Physical Exam  Constitutional: She is oriented to person, place, and time. She appears well-developed and well-nourished.  HENT:  Head: Normocephalic.  Posterior scalp hematoma tenderness to palpation c-collar in place.  Eyes: Pupils are equal, round, and reactive to light. Right eye exhibits no discharge. Left eye exhibits no discharge.  Neck: Normal range of motion. Neck supple. No tracheal deviation present.  Cardiovascular: Normal rate and regular rhythm.   Pulmonary/Chest: She has rales (crackles at bases bilateral, right anterior and flank lower ribs tender palpation with mild step off).  Abdominal: Soft. She exhibits no distension. There is no tenderness. There is no guarding.  Musculoskeletal: She exhibits tenderness. She exhibits no edema.  Patient has tenderness mid and lower thoracic midline no step-off, c-collar in place, no lumbar tenderness. No obvious tenderness to hips bilateral knees bilateral.  Neurological: She is alert and oriented to person, place, and time. GCS eye subscore is 4. GCS verbal subscore is 5. GCS motor subscore is 6.  Patient moving all extremities equal with mild generalized weakness, gross sensation and pain to palpation intact bilateral. Pupils equal, normal speech.  Skin: Skin is warm. No rash noted.  Patient has superficial skin tear left lateral calf mild bleeding  Psychiatric: She has a normal mood and affect.  Nursing note and vitals reviewed.   ED Course  Procedures (including critical care time) Ultrasound limited abdominal and limited  transthoracic ultrasound (FAST)  Indication: flank pain trauma Four views were obtained using the low frequency transducer: Splenorenal, Hepatorenal, Retrovesical, Pericardial subxyphoid Interpretation: No free fluid visualized surrounding the kidneys, pelvis or pericardium. Images archived electronically Dr. Jodi Mourning personally performed and interpreted the images Emergency Ultrasound: Limited Thoracic Performed and interpreted by Dr Jodi Mourning Longitudinal view of anterior left and right lung fields in real-time with linear probe. Indication: sob Findings: pos lung sliding no B lines Interpretation: no evidence of pneumothorax. Images electronically archived.      CRITICAL CARE Performed by: Enid Skeens   Total critical care time: 40 min  Critical care time was exclusive of separately billable procedures and treating other patients.  Critical care was necessary to treat or prevent imminent or life-threatening deterioration.  Critical care was time spent personally by me on the following activities: development of treatment plan with patient and/or surrogate as well as nursing, discussions with consultants, evaluation of patient's response to treatment, examination of patient, obtaining history from patient or surrogate, ordering and performing treatments and interventions, ordering and review of laboratory studies, ordering and review of radiographic studies, pulse oximetry and re-evaluation  of patient's condition.  Labs Review Labs Reviewed  COMPREHENSIVE METABOLIC PANEL - Abnormal; Notable for the following:    Glucose, Bld 119 (*)    Alkaline Phosphatase 151 (*)    GFR calc non Af Amer 56 (*)    GFR calc Af Amer 65 (*)    All other components within normal limits  PROTIME-INR - Abnormal; Notable for the following:    Prothrombin Time 26.6 (*)    INR 2.43 (*)    All other components within normal limits  CBC WITH DIFFERENTIAL/PLATELET - Abnormal; Notable for the  following:    WBC 11.7 (*)    Neutrophils Relative % 87 (*)    Neutro Abs 10.2 (*)    Lymphocytes Relative 8 (*)    All other components within normal limits  URINALYSIS, ROUTINE W REFLEX MICROSCOPIC - Abnormal; Notable for the following:    Color, Urine BROWN (*)    APPearance TURBID (*)    Specific Gravity, Urine >1.046 (*)    Hgb urine dipstick LARGE (*)    Bilirubin Urine MODERATE (*)    Ketones, ur 15 (*)    Protein, ur 100 (*)    Leukocytes, UA SMALL (*)    All other components within normal limits  URINE MICROSCOPIC-ADD ON - Abnormal; Notable for the following:    Casts GRANULAR CAST (*)    All other components within normal limits  I-STAT CHEM 8, ED - Abnormal; Notable for the following:    Glucose, Bld 118 (*)    Calcium, Ion 1.09 (*)    All other components within normal limits  CDS SEROLOGY  ETHANOL  CBC  TYPE AND SCREEN  ABO/RH    Imaging Review Ct Head Wo Contrast  09/12/2014   CLINICAL DATA:  Head injury and hematoma after fall down steps. No report of loss of consciousness.  EXAM: CT HEAD WITHOUT CONTRAST  CT CERVICAL SPINE WITHOUT CONTRAST  TECHNIQUE: Multidetector CT imaging of the head and cervical spine was performed following the standard protocol without intravenous contrast. Multiplanar CT image reconstructions of the cervical spine were also generated.  COMPARISON:  None.  FINDINGS: CT HEAD FINDINGS  Bony calvarium appears intact. Mild diffuse cortical atrophy is noted. Mild chronic ischemic white matter disease is noted. No mass effect or midline shift is noted. Ventricular size is within normal limits. There is no evidence of mass lesion or hemorrhage. Low density is noted in the right cerebellar hemisphere concerning for infarction of indeterminate age.  CT CERVICAL SPINE FINDINGS  Mildly displaced fracture is seen involving the right side of the C2 vertebral body which extends into vertebral artery foramen. There also appears to be a nondisplaced fracture  involving the left side of the C2 vertebral body at the base of the dens. There appears to be malalignment or some degree of rotary subluxation of C1-2 which may be traumatic in origin. Degenerative disc disease is noted at C3-4, C4-5, C5-6 and C6-7.  IMPRESSION: Mild diffuse cortical atrophy. Mild chronic ischemic white matter disease. Low density is noted in right cerebellar hemisphere concerning for infarction of indeterminate age. MRI may be performed for further evaluation.  Mildly displaced fracture is seen involving the right side of the C2 vertebral body which extends into the vertebral artery foramen. There also appears to be a nondisplaced fracture involving the left side of the C2 vertebral body at the base of the dens. Malalignment or rotary subluxation of the atlantoaxial joint is noted which may be traumatic  in origin. Critical Value/emergent results were called by telephone at the time of interpretation on 09/12/2014 at 10:12 am to Dr. Blane Ohara , who verbally acknowledged these results.   Electronically Signed   By: Lupita Raider, M.D.   On: 09/12/2014 10:15   Ct Chest W Contrast  09/12/2014   CLINICAL DATA:  Multiple trauma after falling down several steps. Scalp hematoma. Back pain.  EXAM: CT CHEST, ABDOMEN, AND PELVIS WITH CONTRAST  TECHNIQUE: Multidetector CT imaging of the chest, abdomen and pelvis was performed following the standard protocol during bolus administration of intravenous contrast.  CONTRAST:  80mL OMNIPAQUE IOHEXOL 300 MG/ML  SOLN  COMPARISON:  Radiographs dated 02/29/2015  FINDINGS: CT CHEST FINDINGS  There is an acute compression fracture of the T11 vertebral body with 4 mm of protrusion of the posterior aspect of T11 into the spinal canal.  There are multiple old bilateral rib fractures. There are fractures of the lateral aspects of the right eighth and ninth ribs which could be acute. There is no pneumothorax. There are patchy areas of infiltrate or atelectasis at the  left lung base.  There are multiple small pulmonary nodules. The nodules on the right are unchanged. There is a new 3 mm nodule in the left lower lobe on image 26 of series 202. There is a stable 4 mm nodule in the left lower lobe on image 27 of 2 O2. There is a new 3 mm nodule on image 39.  There is extensive calcification in the thoracic aorta. Overall heart size is normal. Pacemaker in place.  There is a small left pleural effusion and a tiny right effusion.  CT ABDOMEN AND PELVIS FINDINGS  Liver, spleen, pancreas, and right adrenal gland are normal. There is an inhomogeneous 3.3 x 1.8 x 1.5 cm lesion in the left adrenal gland, minimally increased in size since 2007.  There is a 3.7 x 3.6 x 3.5 cm angiomyolipoma arising from the posterior aspect of the mid right kidney. There are several small cysts in the right kidney. There are also several cysts in the left kidney with the largest being 3.1 in the upper pole.  The bowel is normal except for an umbilical hernia containing a small portion of the transverse colon and fecal impaction in the rectum.  The bladder, uterus, and ovaries are normal except for calcifications in the uterus.  There is multilevel degenerative disc and joint disease in the lumbar spine with moderate spinal stenosis at L4-5 due to spondylolisthesis.  IMPRESSION:: IMPRESSION: 1. Moderate acute compression fracture of T11 with slight protrusion of bone into the spinal canal. 2. Possible acute fractures of the lateral aspects of the right eighth and ninth ribs. 3. Patchy areas of atelectasis or infiltrate at the left lung base. Multiple pulmonary nodules, most likely benign and most of which are stable. 4. Chronic mass in the left adrenal gland, minimally progressed since the prior study and probably representing an adenoma. 5. 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   Electronically Signed   By: Francene Boyers M.D.   On: 09/12/2014 10:28   Ct Cervical Spine Wo  Contrast  09/12/2014   CLINICAL DATA:  Head injury and hematoma after fall down steps. No report of loss of consciousness.  EXAM: CT HEAD WITHOUT CONTRAST  CT CERVICAL SPINE WITHOUT CONTRAST  TECHNIQUE: Multidetector CT imaging of the head and cervical spine was performed following the standard protocol without intravenous contrast. Multiplanar CT image reconstructions of  the cervical spine were also generated.  COMPARISON:  None.  FINDINGS: CT HEAD FINDINGS  Bony calvarium appears intact. Mild diffuse cortical atrophy is noted. Mild chronic ischemic white matter disease is noted. No mass effect or midline shift is noted. Ventricular size is within normal limits. There is no evidence of mass lesion or hemorrhage. Low density is noted in the right cerebellar hemisphere concerning for infarction of indeterminate age.  CT CERVICAL SPINE FINDINGS  Mildly displaced fracture is seen involving the right side of the C2 vertebral body which extends into vertebral artery foramen. There also appears to be a nondisplaced fracture involving the left side of the C2 vertebral body at the base of the dens. There appears to be malalignment or some degree of rotary subluxation of C1-2 which may be traumatic in origin. Degenerative disc disease is noted at C3-4, C4-5, C5-6 and C6-7.  IMPRESSION: Mild diffuse cortical atrophy. Mild chronic ischemic white matter disease. Low density is noted in right cerebellar hemisphere concerning for infarction of indeterminate age. MRI may be performed for further evaluation.  Mildly displaced fracture is seen involving the right side of the C2 vertebral body which extends into the vertebral artery foramen. There also appears to be a nondisplaced fracture involving the left side of the C2 vertebral body at the base of the dens. Malalignment or rotary subluxation of the atlantoaxial joint is noted which may be traumatic in origin. Critical Value/emergent results were called by telephone at the time  of interpretation on 09/12/2014 at 10:12 am to Dr. Blane Ohara , who verbally acknowledged these results.   Electronically Signed   By: Lupita Raider, M.D.   On: 09/12/2014 10:15   Ct Abdomen Pelvis W Contrast  09/12/2014   CLINICAL DATA:  Multiple trauma after falling down several steps. Scalp hematoma. Back pain.  EXAM: CT CHEST, ABDOMEN, AND PELVIS WITH CONTRAST  TECHNIQUE: Multidetector CT imaging of the chest, abdomen and pelvis was performed following the standard protocol during bolus administration of intravenous contrast.  CONTRAST:  80mL OMNIPAQUE IOHEXOL 300 MG/ML  SOLN  COMPARISON:  Radiographs dated 02/29/2015  FINDINGS: CT CHEST FINDINGS  There is an acute compression fracture of the T11 vertebral body with 4 mm of protrusion of the posterior aspect of T11 into the spinal canal.  There are multiple old bilateral rib fractures. There are fractures of the lateral aspects of the right eighth and ninth ribs which could be acute. There is no pneumothorax. There are patchy areas of infiltrate or atelectasis at the left lung base.  There are multiple small pulmonary nodules. The nodules on the right are unchanged. There is a new 3 mm nodule in the left lower lobe on image 26 of series 202. There is a stable 4 mm nodule in the left lower lobe on image 27 of 2 O2. There is a new 3 mm nodule on image 39.  There is extensive calcification in the thoracic aorta. Overall heart size is normal. Pacemaker in place.  There is a small left pleural effusion and a tiny right effusion.  CT ABDOMEN AND PELVIS FINDINGS  Liver, spleen, pancreas, and right adrenal gland are normal. There is an inhomogeneous 3.3 x 1.8 x 1.5 cm lesion in the left adrenal gland, minimally increased in size since 2007.  There is a 3.7 x 3.6 x 3.5 cm angiomyolipoma arising from the posterior aspect of the mid right kidney. There are several small cysts in the right kidney. There are also several  cysts in the left kidney with the largest being  3.1 in the upper pole.  The bowel is normal except for an umbilical hernia containing a small portion of the transverse colon and fecal impaction in the rectum.  The bladder, uterus, and ovaries are normal except for calcifications in the uterus.  There is multilevel degenerative disc and joint disease in the lumbar spine with moderate spinal stenosis at L4-5 due to spondylolisthesis.  IMPRESSION:: IMPRESSION: 1. Moderate acute compression fracture of T11 with slight protrusion of bone into the spinal canal. 2. Possible acute fractures of the lateral aspects of the right eighth and ninth ribs. 3. Patchy areas of atelectasis or infiltrate at the left lung base. Multiple pulmonary nodules, most likely benign and most of which are stable. 4. Chronic mass in the left adrenal gland, minimally progressed since the prior study and probably representing an adenoma. 5. 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   Electronically Signed   By: Francene BoyersJames  Maxwell M.D.   On: 09/12/2014 10:28   Dg Pelvis Portable  09/12/2014   CLINICAL DATA:  Acute pelvic pain after fall this morning. Initial encounter.  EXAM: PORTABLE PELVIS 1-2 VIEWS  COMPARISON:  None.  FINDINGS: There is no evidence of pelvic fracture or diastasis. No pelvic bone lesions are seen. Hip and sacroiliac joints appear normal.  IMPRESSION: Normal pelvis.   Electronically Signed   By: Lupita RaiderJames  Green Jr, M.D.   On: 09/12/2014 09:19   Dg Chest Port 1 View  09/12/2014   CLINICAL DATA:  Patient fell earlier today with right-sided chest pain  EXAM: PORTABLE CHEST - 1 VIEW  COMPARISON:  December 14, 2012  FINDINGS: Evidence of a degree of underlying emphysema is stable. There is no edema or consolidation. Heart is upper normal in size with pulmonary vascularity within normal limits. Pacemaker leads are attached to the right atrium and right ventricle. No adenopathy. No pneumothorax. There is a fracture of the right posterior seventh rib. There are fractures of  the antrerolateral seventh and eighth ribs.  IMPRESSION: Rib fractures on the right without pneumothorax or effusion. Underlying emphysematous change. No edema or consolidation.   Electronically Signed   By: Bretta BangWilliam  Woodruff III M.D.   On: 09/12/2014 09:18     EKG Interpretation   Date/Time:  Monday September 12 2014 09:01:34 EST Ventricular Rate:  71 PR Interval:    QRS Duration: 118 QT Interval:  430 QTC Calculation: 467 R Axis:   -72 Text Interpretation:  ventricular-paced rhythm No further analysis  attempted due to paced rhythm Baseline wander in lead(s) V1 Confirmed by  Carle Dargan  MD, Shalev Helminiak ([id number]) on 09/12/2014 9:05:44 AM      MDM   Final diagnoses:  C2 cervical fracture, closed, initial encounter  T11 vertebral fracture, closed, initial encounter  Fall, initial encounter  Acute head injury, initial encounter  Rib fractures, right, closed, initial encounter   Patient presents with multiple injuries from a fall from 7 stairs. With age, on blood thinners and mild tachypnea level II trauma called. Reassessment patient has borderline vitals and presentation for level one. Holding on level II trauma this time, pain meds ordered, full CT trauma scans, portable x-rays. Spoke with the nurse for trauma surgeon on call to let them know patient presentation.  CT scan results discussed with radiology. Trauma aware of cervical and thoracic fractures, neurology consult at. No focal weakness at this time, collar placed. Recheck and updated family.  The patients results  and plan were reviewed and discussed.   Any x-rays performed were personally reviewed by myself.   Differential diagnosis were considered with the presenting HPI.  Medications  fentaNYL (SUBLIMAZE) injection (25 mcg Intravenous Given 09/12/14 0905)  fentaNYL (SUBLIMAZE) injection (25 mcg Intravenous Given 09/12/14 1003)  0.9 %  sodium chloride infusion (1,000 mLs Intravenous New Bag/Given 09/12/14 0855)  iohexol (OMNIPAQUE)  300 MG/ML solution 80 mL (80 mLs Intravenous Contrast Given 09/12/14 0955)    Filed Vitals:   09/12/14 1045 09/12/14 1100 09/12/14 1115 09/12/14 1130  BP: 128/67 125/62 122/85 132/45  Pulse: 69 76 69 69  Temp:      TempSrc:      Resp: 26 28 26 27   SpO2: 100% 97% 99% 100%    Final diagnoses:  C2 cervical fracture, closed, initial encounter  T11 vertebral fracture, closed, initial encounter  Fall, initial encounter  Acute head injury, initial encounter  Rib fractures, right, closed, initial encounter    Admission/ observation were discussed with the admitting physician, patient and/or family and they are comfortable with the plan.       Enid Skeens, MD 09/12/14 1141  Enid Skeens, MD 09/12/14 [phone]

## 2014-09-12 NOTE — ED Notes (Signed)
Myself ands Crecencio McNikki S, RN replaced soft c-collar with Aspen collar; Tinnie GensJeffrey, [place]PA (trauma) present in room speaking with pt and family that is at bedside

## 2014-09-12 NOTE — ED Notes (Signed)
Per EMS- pt had a fall down 5-6 wooden steps. Pt noted to have hematoma to hematoma to head. Pt began having increased respiratory rate during transport

## 2014-09-12 NOTE — ED Notes (Signed)
Call patient daughter with updates: [phone].

## 2014-09-12 NOTE — ED Notes (Signed)
Pt. Switch from stretcher to hospital bed for comfort

## 2014-09-12 NOTE — ED Notes (Signed)
Pt transported to CT with this RN. Pt tolerated well and remained on monitor. Pt returned to room with family now at bedside.

## 2014-09-13 DIAGNOSIS — D62 Acute posthemorrhagic anemia: Secondary | ICD-10-CM | POA: Diagnosis not present

## 2014-09-13 DIAGNOSIS — S22089A Unspecified fracture of T11-T12 vertebra, initial encounter for closed fracture: Secondary | ICD-10-CM | POA: Diagnosis present

## 2014-09-13 LAB — BASIC METABOLIC PANEL WITH GFR
Anion gap: 8 (ref 5–15)
BUN: 14 mg/dL (ref 6–23)
CO2: 28 mmol/L (ref 19–32)
Calcium: 8.5 mg/dL (ref 8.4–10.5)
Chloride: 104 mmol/L (ref 96–112)
Creatinine, Ser: 0.82 mg/dL (ref 0.50–1.10)
GFR calc Af Amer: 76 mL/min — ABNORMAL LOW
GFR calc non Af Amer: 65 mL/min — ABNORMAL LOW
Glucose, Bld: 100 mg/dL — ABNORMAL HIGH (ref 70–99)
Potassium: 4.1 mmol/L (ref 3.5–5.1)
Sodium: 140 mmol/L (ref 135–145)

## 2014-09-13 LAB — CBC
HCT: 32.2 % — ABNORMAL LOW (ref 36.0–46.0)
HCT: 33 % — ABNORMAL LOW (ref 36.0–46.0)
Hemoglobin: 10.4 g/dL — ABNORMAL LOW (ref 12.0–15.0)
Hemoglobin: 10.5 g/dL — ABNORMAL LOW (ref 12.0–15.0)
MCH: 28.7 pg (ref 26.0–34.0)
MCH: 28.3 pg (ref 26.0–34.0)
MCHC: 32.3 g/dL (ref 30.0–36.0)
MCHC: 31.8 g/dL (ref 30.0–36.0)
MCV: 88.7 fL (ref 78.0–100.0)
MCV: 88.9 fL (ref 78.0–100.0)
Platelets: 167 10*3/uL (ref 150–400)
Platelets: 166 K/uL (ref 150–400)
RBC: 3.63 MIL/uL — ABNORMAL LOW (ref 3.87–5.11)
RBC: 3.71 MIL/uL — ABNORMAL LOW (ref 3.87–5.11)
RDW: 13.6 % (ref 11.5–15.5)
RDW: 13.6 % (ref 11.5–15.5)
WBC: 9.6 10*3/uL (ref 4.0–10.5)
WBC: 10.5 K/uL (ref 4.0–10.5)

## 2014-09-13 LAB — PROTIME-INR
INR: 2.91 — ABNORMAL HIGH (ref 0.00–1.49)
Prothrombin Time: 30.6 seconds — ABNORMAL HIGH (ref 11.6–15.2)

## 2014-09-13 MED ORDER — ENSURE COMPLETE PO LIQD
237.0000 mL | Freq: Three times a day (TID) | ORAL | Status: DC
  Administered 2014-09-13 – 2014-09-15 (×6): 237 mL via ORAL

## 2014-09-13 MED ORDER — CIPROFLOXACIN HCL 500 MG PO TABS
500.0000 mg | ORAL_TABLET | Freq: Two times a day (BID) | ORAL | Status: DC
  Administered 2014-09-13 – 2014-09-15 (×4): 500 mg via ORAL
  Filled 2014-09-13 (×6): qty 1

## 2014-09-13 MED ORDER — ACETAMINOPHEN 325 MG PO TABS
650.0000 mg | ORAL_TABLET | Freq: Four times a day (QID) | ORAL | Status: DC | PRN
  Administered 2014-09-13 – 2014-09-15 (×5): 650 mg via ORAL
  Filled 2014-09-13 (×5): qty 2

## 2014-09-13 NOTE — Progress Notes (Signed)
[facility]  Patient Status: Active (receiving services up to time of hospitalization)  [facility] is providing the following services: RN, PT and OT  If patient discharges after hours, please call [phone].   Kizzie FurnishDonna Fellmy 09/13/2014, 10:15 AM

## 2014-09-13 NOTE — Progress Notes (Signed)
UR completed.  Pt was active with [facility] prior to admission w/ HHRN/PT/OT.  Await therapy evals to determine if home w/ HH is still safe option for the patient.   Carlyle LipaMichelle Dominyck Reser, RN BSN M[facility] CCM Trauma/Neuro ICU Case Manager [phone]

## 2014-09-13 NOTE — Progress Notes (Signed)
OT Cancellation Note  Patient Details Name: Lisa MansBarbara L Arellano MRN: [id number] DOB: July 10, 1931   Cancelled Treatment:    Reason Eval/Treat Not Completed: Other (comment). Pt's INR trending up. Spoke with nurse now 2.91.  Will hold evaluation for now.   Earlie RavelingStraub, Kierah Goatley L OTR/L [phone] 09/13/2014, 2:25 PM

## 2014-09-13 NOTE — Progress Notes (Signed)
INITIAL NUTRITION ASSESSMENT  DOCUMENTATION CODES Per approved criteria  -Not Applicable   INTERVENTION:  Ensure Complete PO TID, each supplement provides 350 kcal and 13 grams of protein  NUTRITION DIAGNOSIS: Inadequate oral intake related to poor appetite as evidenced by 40-50% meal completion.   Goal: Intake to meet >90% of estimated nutrition needs.  Monitor:  PO intake, labs, weight trend.  Reason for Assessment: Malnutrition Screening Tool  81 y.o. female  Admitting Dx: S/P fall down steps  ASSESSMENT: Patient admitted S/P mechanical fall down several steps at home with C2 fx and T11 fx.  Patient reports that she has lost weight over the past year. She was eating poorly ~1 year ago because she was at her home alone and didn't want to cook or eat. She moved in with her daughter and has been eating well since that time. Weight down 9 lbs in one year from 02/2013 to 02/2014, down 3 lbs in the past 6 months. Weight loss is not significant. Currently consuming 40-50% of meals per discussion with RN due to poor appetite. Some mild muscle wasting noticed.  Nutrition Focused Physical Exam:  Subcutaneous Fat:  Orbital Region: WNL Upper Arm Region: NA Thoracic and Lumbar Region: NA  Muscle:  Temple Region: WNL Clavicle Bone Region: mild depletion Clavicle and Acromion Bone Region: WNL Scapular Bone Region: NA Dorsal Hand: NA Patellar Region: WNL Anterior Thigh Region: WNL Posterior Calf Region: mild depletion  Edema: none   Height: Ht Readings from Last 1 Encounters:  09/12/14 5\' 2"  (1.575 m)    Weight: Wt Readings from Last 1 Encounters:  09/12/14 126 lb 14.4 oz (57.561 kg)    Ideal Body Weight: 50 kg  % Ideal Body Weight: 115%  Wt Readings from Last 10 Encounters:  09/12/14 126 lb 14.4 oz (57.561 kg)  07/28/14 124 lb (56.246 kg)  07/12/14 126 lb (57.153 kg)  03/08/14 129 lb (58.514 kg)  03/03/13 138 lb 12.8 oz (62.959 kg)  12/14/12 141 lb 3.2 oz  (64.048 kg)  04/20/12 148 lb 3.2 oz (67.223 kg)  02/24/12 147 lb 2.4 oz (66.747 kg)  07/22/11 159 lb 3.2 oz (72.213 kg)  03/04/11 162 lb 6.4 oz (73.664 kg)    Usual Body Weight: 129 lbs (6 months ago)  % Usual Body Weight: 98%  BMI:  Body mass index is 23.2 kg/(m^2).  Estimated Nutritional Needs: Kcal: 1450-1650 Protein: 75-85 gm Fluid: >/= 1.5 L  Skin: no issues  Diet Order: Diet regular  EDUCATION NEEDS: -Education not appropriate at this time   Intake/Output Summary (Last 24 hours) at 09/13/14 1518 Last data filed at 09/13/14 0700  Gross per 24 hour  Intake 398.33 ml  Output    100 ml  Net 298.33 ml    Last BM: 3/1   Labs:   Recent Labs Lab 09/12/14 0914 09/12/14 0925 09/13/14 0300  NA 140 140 140  K 4.4 4.4 4.1  CL 105 103 104  CO2 27  --  28  BUN 11 16 14   CREATININE 0.93 0.90 0.82  CALCIUM 8.6  --  8.5  GLUCOSE 119* 118* 100*    CBG (last 3)  No results for input(s): GLUCAP in the last 72 hours.  Scheduled Meds: . antiseptic oral rinse  7 mL Mouth Rinse BID  . budesonide-formoterol  2 puff Inhalation BID  . docusate sodium  200 mg Oral BID  . escitalopram  20 mg Oral Daily  . feeding supplement (ENSURE COMPLETE)  237 mL  Oral BID BM  . pantoprazole  40 mg Oral Daily  . polyethylene glycol  17 g Oral Daily    Continuous Infusions: . 0.45 % NaCl with KCl 20 mEq / L 10 mL/hr (09/13/14 1610)    Past Medical History  Diagnosis Date  . Tobacco abuse   . Paroxysmal atrial fibrillation     AICD/pacemaker  . Pulmonary nodule   . COPD (chronic obstructive pulmonary disease)     Spirometry 06/10/05, FEV1 46% and ratio 51% with 14% better after albuterol    Past Surgical History  Procedure Laterality Date  . Pacemaker insertion    . Soft tissue tumor resection      From right forearm  . Skin graft    . Biv pacemaker generator change out N/A 07/28/2014    Procedure: BIV PACEMAKER GENERATOR CHANGE OUT;  Surgeon: Marinus Maw, MD;   Location: [facility] CATH LAB;  Service: Cardiovascular;  Laterality: N/A;    Joaquin Courts, RD, LDN, CNSC Pager [phone] After Hours Pager [phone]

## 2014-09-13 NOTE — Progress Notes (Addendum)
Patient ID: Lisa MansBarbara L Arellano, female   DOB: 05/10/1931, 81 y.o.   MRN: [id number]    Subjective: C/O feeling confused, does not remember much of what happened yesterday. Does report neck pain relieved by Tylenol and back pain just when they moved her.  Objective: Vital signs in last 24 hours: Temp:  [98.2 F (36.8 C)-98.9 F (37.2 C)] 98.2 F (36.8 C) (03/01 0726) Pulse Rate:  [67-76] 73 (03/01 0726) Resp:  [19-29] 21 (03/01 0726) BP: (122-156)/(45-87) 150/60 mmHg (03/01 0726) SpO2:  [95 %-100 %] 96 % (03/01 0726) Weight:  [126 lb 14.4 oz (57.561 kg)] 126 lb 14.4 oz (57.561 kg) (02/29 1426)    Intake/Output from previous day: 02/29 0701 - 03/01 0700 In: 1398.3 [I.V.:1398.3] Out: 120 [Urine:120] Intake/Output this shift:    General appearance: cooperative Neck: collar Back: mild tenderness lower back Resp: clear to auscultation bilaterally Cardio: S1, S2 normal GI: soft, NT, ND, +BS  Neuro: opens eyes to command but otherwise leaves them closed, F/C, MAE  Lab Results: CBC   Recent Labs  09/12/14 0914 09/12/14 0925 09/13/14 0300  WBC 11.7*  --  9.6  HGB 12.9 15.0 10.4*  HCT 40.9 44.0 32.2*  PLT 171  --  167   BMET  Recent Labs  09/12/14 0914 09/12/14 0925 09/13/14 0300  NA 140 140 140  K 4.4 4.4 4.1  CL 105 103 104  CO2 27  --  28  GLUCOSE 119* 118* 100*  BUN 11 16 14   CREATININE 0.93 0.90 0.82  CALCIUM 8.6  --  8.5   PT/INR  Recent Labs  09/12/14 0914 09/13/14 0300  LABPROT 26.6* 30.6*  INR 2.43* 2.91*   ABG No results for input(s): PHART, HCO3 in the last 72 hours.  Invalid input(s): PCO2, PO2  Studies/Results: Ct Head Wo Contrast  09/12/2014   CLINICAL DATA:  Head injury and hematoma after fall down steps. No report of loss of consciousness.  EXAM: CT HEAD WITHOUT CONTRAST  CT CERVICAL SPINE WITHOUT CONTRAST  TECHNIQUE: Multidetector CT imaging of the head and cervical spine was performed following the standard protocol without intravenous  contrast. Multiplanar CT image reconstructions of the cervical spine were also generated.  COMPARISON:  None.  FINDINGS: CT HEAD FINDINGS  Bony calvarium appears intact. Mild diffuse cortical atrophy is noted. Mild chronic ischemic white matter disease is noted. No mass effect or midline shift is noted. Ventricular size is within normal limits. There is no evidence of mass lesion or hemorrhage. Low density is noted in the right cerebellar hemisphere concerning for infarction of indeterminate age.  CT CERVICAL SPINE FINDINGS  Mildly displaced fracture is seen involving the right side of the C2 vertebral body which extends into vertebral artery foramen. There also appears to be a nondisplaced fracture involving the left side of the C2 vertebral body at the base of the dens. There appears to be malalignment or some degree of rotary subluxation of C1-2 which may be traumatic in origin. Degenerative disc disease is noted at C3-4, C4-5, C5-6 and C6-7.  IMPRESSION: Mild diffuse cortical atrophy. Mild chronic ischemic white matter disease. Low density is noted in right cerebellar hemisphere concerning for infarction of indeterminate age. MRI may be performed for further evaluation.  Mildly displaced fracture is seen involving the right side of the C2 vertebral body which extends into the vertebral artery foramen. There also appears to be a nondisplaced fracture involving the left side of the C2 vertebral body at the base of  the dens. Malalignment or rotary subluxation of the atlantoaxial joint is noted which may be traumatic in origin. Critical Value/emergent results were called by telephone at the time of interpretation on 09/12/2014 at 10:12 am to Dr. Blane Ohara , who verbally acknowledged these results.   Electronically Signed   By: Lupita Raider, M.D.   On: 09/12/2014 10:15   Ct Chest W Contrast  09/12/2014   CLINICAL DATA:  Multiple trauma after falling down several steps. Scalp hematoma. Back pain.  EXAM: CT  CHEST, ABDOMEN, AND PELVIS WITH CONTRAST  TECHNIQUE: Multidetector CT imaging of the chest, abdomen and pelvis was performed following the standard protocol during bolus administration of intravenous contrast.  CONTRAST:  80mL OMNIPAQUE IOHEXOL 300 MG/ML  SOLN  COMPARISON:  Radiographs dated 02/29/2015  FINDINGS: CT CHEST FINDINGS  There is an acute compression fracture of the T11 vertebral body with 4 mm of protrusion of the posterior aspect of T11 into the spinal canal.  There are multiple old bilateral rib fractures. There are fractures of the lateral aspects of the right eighth and ninth ribs which could be acute. There is no pneumothorax. There are patchy areas of infiltrate or atelectasis at the left lung base.  There are multiple small pulmonary nodules. The nodules on the right are unchanged. There is a new 3 mm nodule in the left lower lobe on image 26 of series 202. There is a stable 4 mm nodule in the left lower lobe on image 27 of 2 O2. There is a new 3 mm nodule on image 39.  There is extensive calcification in the thoracic aorta. Overall heart size is normal. Pacemaker in place.  There is a small left pleural effusion and a tiny right effusion.  CT ABDOMEN AND PELVIS FINDINGS  Liver, spleen, pancreas, and right adrenal gland are normal. There is an inhomogeneous 3.3 x 1.8 x 1.5 cm lesion in the left adrenal gland, minimally increased in size since 2007.  There is a 3.7 x 3.6 x 3.5 cm angiomyolipoma arising from the posterior aspect of the mid right kidney. There are several small cysts in the right kidney. There are also several cysts in the left kidney with the largest being 3.1 in the upper pole.  The bowel is normal except for an umbilical hernia containing a small portion of the transverse colon and fecal impaction in the rectum.  The bladder, uterus, and ovaries are normal except for calcifications in the uterus.  There is multilevel degenerative disc and joint disease in the lumbar spine with  moderate spinal stenosis at L4-5 due to spondylolisthesis.  IMPRESSION:: IMPRESSION: 1. Moderate acute compression fracture of T11 with slight protrusion of bone into the spinal canal. 2. Possible acute fractures of the lateral aspects of the right eighth and ninth ribs. 3. Patchy areas of atelectasis or infiltrate at the left lung base. Multiple pulmonary nodules, most likely benign and most of which are stable. 4. Chronic mass in the left adrenal gland, minimally progressed since the prior study and probably representing an adenoma. 5. 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   Electronically Signed   By: Francene Boyers M.D.   On: 09/12/2014 10:28   Ct Cervical Spine Wo Contrast  09/12/2014   CLINICAL DATA:  Head injury and hematoma after fall down steps. No report of loss of consciousness.  EXAM: CT HEAD WITHOUT CONTRAST  CT CERVICAL SPINE WITHOUT CONTRAST  TECHNIQUE: Multidetector CT imaging of the head and  cervical spine was performed following the standard protocol without intravenous contrast. Multiplanar CT image reconstructions of the cervical spine were also generated.  COMPARISON:  None.  FINDINGS: CT HEAD FINDINGS  Bony calvarium appears intact. Mild diffuse cortical atrophy is noted. Mild chronic ischemic white matter disease is noted. No mass effect or midline shift is noted. Ventricular size is within normal limits. There is no evidence of mass lesion or hemorrhage. Low density is noted in the right cerebellar hemisphere concerning for infarction of indeterminate age.  CT CERVICAL SPINE FINDINGS  Mildly displaced fracture is seen involving the right side of the C2 vertebral body which extends into vertebral artery foramen. There also appears to be a nondisplaced fracture involving the left side of the C2 vertebral body at the base of the dens. There appears to be malalignment or some degree of rotary subluxation of C1-2 which may be traumatic in origin. Degenerative disc disease is  noted at C3-4, C4-5, C5-6 and C6-7.  IMPRESSION: Mild diffuse cortical atrophy. Mild chronic ischemic white matter disease. Low density is noted in right cerebellar hemisphere concerning for infarction of indeterminate age. MRI may be performed for further evaluation.  Mildly displaced fracture is seen involving the right side of the C2 vertebral body which extends into the vertebral artery foramen. There also appears to be a nondisplaced fracture involving the left side of the C2 vertebral body at the base of the dens. Malalignment or rotary subluxation of the atlantoaxial joint is noted which may be traumatic in origin. Critical Value/emergent results were called by telephone at the time of interpretation on 09/12/2014 at 10:12 am to Dr. Blane Ohara , who verbally acknowledged these results.   Electronically Signed   By: Lupita Raider, M.D.   On: 09/12/2014 10:15   Ct Abdomen Pelvis W Contrast  09/12/2014   CLINICAL DATA:  Multiple trauma after falling down several steps. Scalp hematoma. Back pain.  EXAM: CT CHEST, ABDOMEN, AND PELVIS WITH CONTRAST  TECHNIQUE: Multidetector CT imaging of the chest, abdomen and pelvis was performed following the standard protocol during bolus administration of intravenous contrast.  CONTRAST:  80mL OMNIPAQUE IOHEXOL 300 MG/ML  SOLN  COMPARISON:  Radiographs dated 02/29/2015  FINDINGS: CT CHEST FINDINGS  There is an acute compression fracture of the T11 vertebral body with 4 mm of protrusion of the posterior aspect of T11 into the spinal canal.  There are multiple old bilateral rib fractures. There are fractures of the lateral aspects of the right eighth and ninth ribs which could be acute. There is no pneumothorax. There are patchy areas of infiltrate or atelectasis at the left lung base.  There are multiple small pulmonary nodules. The nodules on the right are unchanged. There is a new 3 mm nodule in the left lower lobe on image 26 of series 202. There is a stable 4 mm  nodule in the left lower lobe on image 27 of 2 O2. There is a new 3 mm nodule on image 39.  There is extensive calcification in the thoracic aorta. Overall heart size is normal. Pacemaker in place.  There is a small left pleural effusion and a tiny right effusion.  CT ABDOMEN AND PELVIS FINDINGS  Liver, spleen, pancreas, and right adrenal gland are normal. There is an inhomogeneous 3.3 x 1.8 x 1.5 cm lesion in the left adrenal gland, minimally increased in size since 2007.  There is a 3.7 x 3.6 x 3.5 cm angiomyolipoma arising from the posterior aspect of  the mid right kidney. There are several small cysts in the right kidney. There are also several cysts in the left kidney with the largest being 3.1 in the upper pole.  The bowel is normal except for an umbilical hernia containing a small portion of the transverse colon and fecal impaction in the rectum.  The bladder, uterus, and ovaries are normal except for calcifications in the uterus.  There is multilevel degenerative disc and joint disease in the lumbar spine with moderate spinal stenosis at L4-5 due to spondylolisthesis.  IMPRESSION:: IMPRESSION: 1. Moderate acute compression fracture of T11 with slight protrusion of bone into the spinal canal. 2. Possible acute fractures of the lateral aspects of the right eighth and ninth ribs. 3. Patchy areas of atelectasis or infiltrate at the left lung base. Multiple pulmonary nodules, most likely benign and most of which are stable. 4. Chronic mass in the left adrenal gland, minimally progressed since the prior study and probably representing an adenoma. 5. 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   Electronically Signed   By: Francene Boyers M.D.   On: 09/12/2014 10:28   Dg Pelvis Portable  09/12/2014   CLINICAL DATA:  Acute pelvic pain after fall this morning. Initial encounter.  EXAM: PORTABLE PELVIS 1-2 VIEWS  COMPARISON:  None.  FINDINGS: There is no evidence of pelvic fracture or diastasis. No  pelvic bone lesions are seen. Hip and sacroiliac joints appear normal.  IMPRESSION: Normal pelvis.   Electronically Signed   By: Lupita Raider, M.D.   On: 09/12/2014 09:19   Dg Chest Port 1 View  09/12/2014   CLINICAL DATA:  Patient fell earlier today with right-sided chest pain  EXAM: PORTABLE CHEST - 1 VIEW  COMPARISON:  December 14, 2012  FINDINGS: Evidence of a degree of underlying emphysema is stable. There is no edema or consolidation. Heart is upper normal in size with pulmonary vascularity within normal limits. Pacemaker leads are attached to the right atrium and right ventricle. No adenopathy. No pneumothorax. There is a fracture of the right posterior seventh rib. There are fractures of the antrerolateral seventh and eighth ribs.  IMPRESSION: Rib fractures on the right without pneumothorax or effusion. Underlying emphysematous change. No edema or consolidation.   Electronically Signed   By: Bretta Bang III M.D.   On: 09/12/2014 09:18    Anti-infectives: Anti-infectives    None      Assessment/Plan: Fall C2 FX - collar per Dr. Jordan Likes T11 compression FX - D/W Dr. Jordan Likes this AM, mobilize without brace and see how she does. PT/OT FEN - KVO IVF, reg diet R Rib FX 8,9 - pulmonary toilet ABL anemia - has been on Coumadin, re-check at 1300. Hold restarting Coumadin until Hb stable. CT A/P showed no intra-abdominal injuries. VTE - INR>2 Dementia Umbilical hernia Chronic diastolic heart failure/Pacer A fib on anticoagulation DIspo - SDU   LOS: 1 day    Violeta Gelinas, MD, MPH, FACS Trauma: [phone] General Surgery: [phone]  09/13/2014

## 2014-09-13 NOTE — Progress Notes (Signed)
Patient ID: Celine MansBarbara L Romero, female   DOB: [birth year], 81 y.o.   MRN: [id number] Called to update family. No answer. Left VM on daughter's mobile phone. Violeta GelinasBurke Antaniya Venuti, MD, MPH, FACS Trauma: [phone] General Surgery: [phone]

## 2014-09-13 NOTE — Evaluation (Signed)
Physical Therapy Evaluation Patient Details Name: [name] MRN: [id number] DOB: [birth year] Today's Date: 09/13/2014   History of Present Illness  pt presents with fall resulting in R rib fxs, C2 fx and mild T11 compression fx.    Clinical Impression  Pt maintained eyes closed throughout most of session, but will open eyes with cues.  Per daughter pt has been doing that more lately.  Spoke with pt and family about D/C planning and at this time feel that SNF is most appropriate and safest D/C plan for pt to maximize independence prior to returning to home.  Will continue to follow.      Follow Up Recommendations SNF    Equipment Recommendations  None recommended by PT    Recommendations for Other Services       Precautions / Restrictions Precautions Precautions: Cervical;Fall Required Braces or Orthoses: Cervical Brace Cervical Brace: Hard collar;At all times Restrictions Weight Bearing Restrictions: No      Mobility  Bed Mobility Overal bed mobility: Needs Assistance Bed Mobility: Rolling;Sidelying to Sit Rolling: Mod assist Sidelying to sit: Mod assist       General bed mobility comments: pt needs step-by-step cueing for technique and encouragement.    Transfers Overall transfer level: Needs assistance Equipment used: 1 person hand held assist Transfers: Sit to/from [employer] Sit to Stand: Mod assist Stand pivot transfers: Mod assist       General transfer comment: cues for step-by-step through transfer to chair.  pt with posterior and left lean and despite facilitation has difficulty correcting.    Ambulation/Gait                Stairs            Wheelchair Mobility    Modified Rankin (Stroke Patients Only)       Balance Overall balance assessment: Needs assistance Sitting-balance support: Single extremity supported;Feet supported Sitting balance-Leahy Scale: Poor Sitting balance - Comments: pt with L lateral lean  at baseline, worse post fall and rib fxs.   Postural control: Posterior lean;Left lateral lean Standing balance support: During functional activity Standing balance-Leahy Scale: Poor                               Pertinent Vitals/Pain Pain Assessment: Faces Faces Pain Scale: Hurts little more Pain Location: R ribs with mobility. Pain Descriptors / Indicators: Aching;Grimacing Pain Intervention(s): Monitored during session;Premedicated before session;Repositioned    Home Living Family/patient expects to be discharged to:: Skilled nursing facility                      Prior Function Level of Independence: Needs assistance   Gait / Transfers Assistance Needed: pt used cane at times and per family should have been using a RW.    ADL's / Homemaking Assistance Needed: Daughter A with ADLs and performed all homemaking tasks.          Hand Dominance        Extremity/Trunk Assessment   Upper Extremity Assessment: Defer to OT evaluation           Lower Extremity Assessment: Generalized weakness      Cervical / Trunk Assessment: Kyphotic  Communication   Communication: No difficulties  Cognition Arousal/Alertness: Awake/alert Behavior During Therapy: WFL for tasks assessed/performed Overall Cognitive Status: History of cognitive impairments - at baseline  General Comments      Exercises        Assessment/Plan    PT Assessment Patient needs continued PT services  PT Diagnosis Difficulty walking;Generalized weakness;Acute pain   PT Problem List Decreased strength;Decreased activity tolerance;Decreased balance;Decreased mobility;Decreased knowledge of use of DME;Decreased knowledge of precautions;Pain  PT Treatment Interventions DME instruction;Gait training;Functional mobility training;Therapeutic activities;Therapeutic exercise;Balance training;Neuromuscular re-education;Patient/family education   PT Goals (Current  goals can be found in the Care Plan section) Acute Rehab PT Goals Patient Stated Goal: Per daughter for pt to move better prior to returning to home.   PT Goal Formulation: With patient/family Time For Goal Achievement: 09/27/14 Potential to Achieve Goals: Good    Frequency Min 3X/week   Barriers to discharge        Co-evaluation               End of Session Equipment Utilized During Treatment: Gait belt;Oxygen Activity Tolerance: Patient limited by fatigue;Patient limited by pain Patient left: in chair;with call bell/phone within reach;with family/visitor present Nurse Communication: Mobility status         Time: 1324-1400 PT Time Calculation (min) (ACUTE ONLY): 36 min   Charges:   PT Evaluation $Initial PT Evaluation Tier I: 1 Procedure PT Treatments $Therapeutic Activity: 8-22 mins   PT G CodesSunny Arellano:        [name] F, [place]PT [phone] 09/13/2014, 4:36 PM

## 2014-09-14 DIAGNOSIS — S2241XA Multiple fractures of ribs, right side, initial encounter for closed fracture: Secondary | ICD-10-CM | POA: Diagnosis present

## 2014-09-14 DIAGNOSIS — W19XXXA Unspecified fall, initial encounter: Secondary | ICD-10-CM | POA: Diagnosis present

## 2014-09-14 LAB — CBC
HEMATOCRIT: 32 % — AB (ref 36.0–46.0)
Hemoglobin: 10.3 g/dL — ABNORMAL LOW (ref 12.0–15.0)
MCH: 28.8 pg (ref 26.0–34.0)
MCHC: 32.2 g/dL (ref 30.0–36.0)
MCV: 89.4 fL (ref 78.0–100.0)
Platelets: 138 10*3/uL — ABNORMAL LOW (ref 150–400)
RBC: 3.58 MIL/uL — ABNORMAL LOW (ref 3.87–5.11)
RDW: 13.5 % (ref 11.5–15.5)
WBC: 10.7 10*3/uL — ABNORMAL HIGH (ref 4.0–10.5)

## 2014-09-14 LAB — BASIC METABOLIC PANEL
Anion gap: 9 (ref 5–15)
BUN: 13 mg/dL (ref 6–23)
CO2: 27 mmol/L (ref 19–32)
Calcium: 8.3 mg/dL — ABNORMAL LOW (ref 8.4–10.5)
Chloride: 103 mmol/L (ref 96–112)
Creatinine, Ser: 0.81 mg/dL (ref 0.50–1.10)
GFR calc Af Amer: 77 mL/min — ABNORMAL LOW (ref >90)
GFR, EST NON AFRICAN AMERICAN: 66 mL/min — AB (ref >90)
Glucose, Bld: 108 mg/dL — ABNORMAL HIGH (ref 70–99)
Potassium: 4 mmol/L (ref 3.5–5.1)
Sodium: 139 mmol/L (ref 135–145)

## 2014-09-14 LAB — URINE CULTURE
COLONY COUNT: NO GROWTH
Culture: NO GROWTH
Special Requests: NORMAL

## 2014-09-14 NOTE — Evaluation (Signed)
Occupational Therapy Evaluation Patient Details Name: [name] MRN: [id number] DOB: June 01, 1931 Today's Date: 09/14/2014    History of Present Illness Pt is an 81 y.o. Female admitted 09/12/14 with fall resulting in R rib fxs, C2 fx, and mild T11 compression fx after a fall.    Clinical Impression   PTA pt lived at home with her daughter, who assisted with bathing and dressing. Pt ambulated with cane. Pt currently requires total A for ADLs due to rib pain, cervical precautions, and limited mobility. Pt will benefit from SNF at d/c and will also benefit from acute OT to address toilet transfers, bed mobility, and self-care in sitting.     Follow Up Recommendations  SNF;Supervision/Assistance - 24 hour    Equipment Recommendations  Other (comment) (defer to SNF)    Recommendations for Other Services       Precautions / Restrictions Precautions Precautions: Cervical;Fall Required Braces or Orthoses: Cervical Brace Cervical Brace: Hard collar;At all times Restrictions Weight Bearing Restrictions: No      Mobility Bed Mobility Overal bed mobility: Needs Assistance Bed Mobility: Rolling;Sidelying to Sit Rolling: Max assist Sidelying to sit: Max assist       General bed mobility comments: Pt required max A and had guarding of ribs through minimal UE movement. Pt required Max A to elevate trunk.   Transfers                 General transfer comment: Not completed at this time due to safety. Pt requested to lay down after sitting ~1 minute    Balance Overall balance assessment: Needs assistance Sitting-balance support: Bilateral upper extremity supported;Feet supported Sitting balance-Leahy Scale: Poor Sitting balance - Comments: pt with L lateral lean at baseline, worse post fall and rib fxs.   Postural control: Posterior lean;Left lateral lean                                  ADL Overall ADL's : Needs assistance/impaired                                        General ADL Comments: Pt requires total A for ADLs at this time. Pt guards ribs and limits UE movement.      Vision Additional Comments: Pt keeps eyes closed during session, but able to open on command. Daughter reports she has been doing this since her fall.           Pertinent Vitals/Pain Pain Assessment: Faces Faces Pain Scale: Hurts little more Pain Location: R Ribs with mobility Pain Descriptors / Indicators: Aching;Grimacing Pain Intervention(s): Limited activity within patient's tolerance;Monitored during session;Repositioned     Hand Dominance Right   Extremity/Trunk Assessment Upper Extremity Assessment Upper Extremity Assessment: Generalized weakness;LUE deficits/detail LUE Deficits / Details: limited shoulder flexion, question if this is baseline   Lower Extremity Assessment Lower Extremity Assessment: Generalized weakness   Cervical / Trunk Assessment Cervical / Trunk Assessment: Kyphotic   Communication Communication Communication: No difficulties   Cognition Arousal/Alertness: Awake/alert Behavior During Therapy: WFL for tasks assessed/performed Overall Cognitive Status: History of cognitive impairments - at baseline                                Home Living Family/patient expects to be discharged  to:: Skilled nursing facility                                        Prior Functioning/Environment Level of Independence: Needs assistance  Gait / Transfers Assistance Needed: pt used cane at times and per family should have been using a RW.   ADL's / Homemaking Assistance Needed: Daughter A with ADLs and performed all homemaking tasks.          OT Diagnosis: Generalized weakness;Acute pain;Cognitive deficits   OT Problem List: Decreased strength;Decreased range of motion;Decreased activity tolerance;Impaired balance (sitting and/or standing);Decreased cognition;Decreased safety  awareness;Decreased knowledge of use of DME or AE;Decreased knowledge of precautions;Impaired UE functional use;Pain   OT Treatment/Interventions: Self-care/ADL training;Therapeutic exercise;Energy conservation;DME and/or AE instruction;Therapeutic activities;Patient/family education;Balance training;Cognitive remediation/compensation    OT Goals(Current goals can be found in the care plan section) Acute Rehab OT Goals Patient Stated Goal: to get repositioned OT Goal Formulation: With patient Time For Goal Achievement: 09/28/14 Potential to Achieve Goals: Good ADL Goals Pt Will Perform Grooming: with min assist;sitting Pt Will Perform Upper Body Bathing: with min assist;sitting Pt Will Perform Upper Body Dressing: with min assist;sitting Pt Will Transfer to Toilet: with mod assist;stand pivot transfer;bedside commode Additional ADL Goal #1: Pt will perform bed mobility with Min A using log roll technique to prepare for ADLs.  OT Frequency: Min 1X/week    End of Session Equipment Utilized During Treatment: Cervical collar Nurse Communication: Other (comment) (pt back in bed)  Activity Tolerance: Patient limited by fatigue Patient left: in bed;with call bell/phone within reach;with bed alarm set;with family/visitor present;with SCD's reapplied   Time: 1340-1403 OT Time Calculation (min): 23 min Charges:  OT General Charges $OT Visit: 1 Procedure OT Evaluation $Initial OT Evaluation Tier I: 1 Procedure OT Treatments $Self Care/Home Management : 8-22 mins G-Codes:    Nena JordanMiller, Nnenna Meador M 09/14/2014, 2:12 PM  Carney LivingLeeAnn Marie Brysun Eschmann, OTR/L Occupational Therapist [phone] (pager)

## 2014-09-14 NOTE — Progress Notes (Signed)
.   Denies neck pain. Note some right lower chest wall pain with attempts to sit up or move. No complaints of back pain. No radiating pains into her lower extremities.  Overall stable. Continue efforts at mobilization. Continue cervical brace.

## 2014-09-14 NOTE — Clinical Social Work Note (Signed)
CSW received call from trauma RNCM. [facility]/family [facility] placement and [facility]. CSW unable to assess patient but will begin referral process.  Roddie McBryant Aileen Amore MSW, [place]asper[place]CSWA, [place], [phone]

## 2014-09-14 NOTE — Progress Notes (Signed)
Patient ID: Lisa MansBarbara L Hintze, female   DOB: Jul 12, 1931, 81 y.o.   MRN: [id number]   LOS: 2 days   Subjective: Feeling better, less c/o confusion.   Objective: Vital signs in last 24 hours: Temp:  [97.5 F (36.4 C)-98.8 F (37.1 C)] 98.3 F (36.8 C) (03/02 0750) Pulse Rate:  [69-72] 72 (03/02 0800) Resp:  [15-26] 15 (03/02 0800) BP: (112-157)/(39-140) 145/68 mmHg (03/02 0800) SpO2:  [90 %-99 %] 98 % (03/02 0800) Last BM Date: 09/13/14   Laboratory  CBC  Recent Labs  09/13/14 1626 09/14/14 0311  WBC 10.5 10.7*  HGB 10.5* 10.3*  HCT 33.0* 32.0*  PLT 166 138*   BMET  Recent Labs  09/13/14 0300 09/14/14 0311  NA 140 139  K 4.1 4.0  CL 104 103  CO2 28 27  GLUCOSE 100* 108*  BUN 14 13  CREATININE 0.82 0.81  CALCIUM 8.5 8.3*   Lab Results  Component Value Date   INR 2.91* 09/13/2014   INR 2.43* 09/12/2014   INR 2.1 08/11/2014    Physical Exam General appearance: alert and no distress Resp: clear to auscultation bilaterally Cardio: regular rate and rhythm GI: normal findings: bowel sounds normal and soft, non-tender Extremities: NVI Neuro: Pleasantly confused   Assessment/Plan: Fall C2 FX - collar per Dr. Jordan LikesPool T11 compression FX - Mobilize without brace and see how she does per Dr. Jordan LikesPool. PT/OT R Rib FX 8,9 - pulmonary toilet ABL anemia - Stable Dementia Umbilical hernia Chronic diastolic heart failure/Pacer A fib on anticoagulation -- Check INR tomorrow, hold coumadin for now UTI -- On Cipro for likely UTI, culture pending FEN - No issues DIspo - Transfer to floor, SNF placement    Freeman CaldronMichael J. Keidrick Murty, PA-C Pager: [phone] General Trauma PA Pager: [phone]  09/14/2014

## 2014-09-15 LAB — PROTIME-INR
INR: 1.83 — AB (ref 0.00–1.49)
Prothrombin Time: 21.3 seconds — ABNORMAL HIGH (ref 11.6–15.2)

## 2014-09-15 MED ORDER — WARFARIN SODIUM 5 MG PO TABS: 5.0000 mg | ORAL_TABLET | ORAL | Status: DC

## 2014-09-15 MED ORDER — WHITE PETROLATUM GEL
Status: AC
  Administered 2014-09-15: 10:00:00
  Filled 2014-09-15: qty 1

## 2014-09-15 MED ORDER — WARFARIN SODIUM 2.5 MG PO TABS: 2.5000 mg | ORAL_TABLET | Freq: Every day | ORAL | Status: DC

## 2014-09-15 MED ORDER — WARFARIN SODIUM 5 MG PO TABS
2.5000 mg | ORAL_TABLET | ORAL | Status: DC
  Filled 2014-09-15: qty 1

## 2014-09-15 MED ORDER — WARFARIN - PHYSICIAN DOSING INPATIENT: Freq: Every day | Status: DC

## 2014-09-15 NOTE — Progress Notes (Signed)
Patient ID: [name], female   DOB: 11/12/1930, 81 y.o.   MRN: [id number] I met with her daughter at the bedside and duiscussed the plan of care. She has been in contact with [facility] as well. Georganna Skeans, MD, MPH, FACS Trauma: [phone] General Surgery: [phone]

## 2014-09-15 NOTE — Clinical Social Work Note (Signed)
Clinical Social Work Department BRIEF PSYCHOSOCIAL ASSESSMENT 09/15/2014  Patient:  [name],[name]     Account Number:  [id number]     Admit date:  09/12/2014  Clinical Social Worker:  Verl BlalockSCINTO,JESSE, LCSW  Date/Time:  09/15/2014 08:30 AM  Referred by:  Care Management  Date Referred:  09/14/2014 Referred for  SNF Placement   Other Referral:   Interview type:  Family Other interview type:   Spoke with patient daughter over the phone due to intermittent confusion for patient.    PSYCHOSOCIAL DATA Living Status:  FAMILY Admitted from facility:   Level of care:   Primary support name:  [name],[name]   [phone] Primary support relationship to patient:  CHILD, ADULT Degree of support available:   Strong and supportive    CURRENT CONCERNS Current Concerns  Post-Acute Placement   Other Concerns:    SOCIAL WORK ASSESSMENT / PLAN Clinical Social Worker spoke with patient daughter over the phone to offer support and discuss patient needs at discharge.  Patient daughter states that patient has been living with her and husband, however with this fall patient will need rehab prior to returning home.  Patient daughter states that she would like patient to participate in short term rehab with plans to return home with family once a little stronger and closer to baseline.  [place] patient [place] but have [facility] and have preference to that facility.  [facility] who is able to offer a bed and admit patient once medically ready.  CSW has relayed bed offer to patient daughter and she plans to complete paperwork with facility liaision while at the hospital today.  CSW remains available for support and to facilitate patient discharge needs once medically stable.   Assessment/plan status:   Other assessment/ plan:   Information/referral to community resources:   Patient with a negative toxicology  screen on admission and no concerns addressed regarding any type of use.  Patient with intermittent confusion and unable to complete SBIRT at this time.  CSW to complete if patient mental status clears and becomes appropriate to discuss.    PATIENT'S/FAMILY'S RESPONSE TO PLAN OF CARE: Patient alert and oriented x3 with intermittent confusion per RN.  Patient with good family support, who are realistic regarding patient needs and plans at discharge. Patient daughter agreeable with current discharge plan. Patient daughter verbalized understanding of CSW role and appreciation for support and involvement.

## 2014-09-15 NOTE — Progress Notes (Signed)
No change in status. Patient awaiting skilled nursing facility placement.

## 2014-09-15 NOTE — Progress Notes (Signed)
Physical Therapy Treatment Patient Details Name: [name] MRN: [id number] DOB: Apr 20, 1931 Today's Date: 09/15/2014    History of Present Illness Pt is an 81 y.o. Female admitted 09/12/14 with fall resulting in R rib fxs, C2 fx, and mild T11 compression fx after a fall.     PT Comments    Pt seems to have increased pain today with mobility and required increased A to transfer to recliner.  Pt continues to need cues to open her eyes, but does follow directions well.  Continue to feel SNF most appropriate for D/C.  Will continue to follow while on acute.    Follow Up Recommendations  SNF     Equipment Recommendations  None recommended by PT    Recommendations for Other Services       Precautions / Restrictions Precautions Precautions: Cervical;Fall Required Braces or Orthoses: Cervical Brace Cervical Brace: Hard collar;At all times Restrictions Weight Bearing Restrictions: No    Mobility  Bed Mobility Overal bed mobility: Needs Assistance Bed Mobility: Rolling;Sidelying to Sit Rolling: Max assist Sidelying to sit: Max assist       General bed mobility comments: Pt required max A and had guarding of ribs through minimal UE movement. Pt required Max A to elevate trunk.   Transfers Overall transfer level: Needs assistance Equipment used: 2 person hand held assist Transfers: Sit to/from UGI CorporationStand;Stand Pivot Transfers Sit to Stand: Mod assist;+2 physical assistance Stand pivot transfers: Max assist;+2 physical assistance       General transfer comment: pt seemed to have increased pain today in R ribs and required increased A for transfer.  pt continues to lean posteriorly and to L side.    Ambulation/Gait                 Stairs            Wheelchair Mobility    Modified Rankin (Stroke Patients Only)       Balance Overall balance assessment: Needs assistance Sitting-balance support: Bilateral upper extremity supported;Feet supported Sitting  balance-Leahy Scale: Poor Sitting balance - Comments: pt with L lateral lean at baseline, worse post fall and rib fxs.   Postural control: Posterior lean;Left lateral lean Standing balance support: During functional activity Standing balance-Leahy Scale: Poor                      Cognition Arousal/Alertness: Awake/alert Behavior During Therapy: WFL for tasks assessed/performed Overall Cognitive Status: History of cognitive impairments - at baseline                      Exercises      General Comments        Pertinent Vitals/Pain Pain Assessment: Faces Faces Pain Scale: Hurts even more Pain Location: R ribs during mobility.   Pain Descriptors / Indicators: Aching;Grimacing Pain Intervention(s): Monitored during session;Premedicated before session;Repositioned    Home Living                      Prior Function            PT Goals (current goals can now be found in the care plan section) Acute Rehab PT Goals Patient Stated Goal: to get repositioned PT Goal Formulation: With patient/family Time For Goal Achievement: 09/27/14 Potential to Achieve Goals: Good Progress towards PT goals: Progressing toward goals    Frequency  Min 3X/week    PT Plan Current plan remains appropriate    Co-evaluation  End of Session Equipment Utilized During Treatment: Gait belt;Oxygen Activity Tolerance: Patient limited by pain Patient left: in chair;with call bell/phone within reach;with family/visitor present     Time: 1111-1130 PT Time Calculation (min) (ACUTE ONLY): 19 min  Charges:  $Therapeutic Activity: 8-22 mins                    G CodesSunny Schlein, [place] [phone] 09/15/2014, 2:46 PM

## 2014-09-15 NOTE — Progress Notes (Signed)
Patient ID: Celine MansBarbara L Dedeaux, female   DOB: 01-Apr-1931, 81 y.o.   MRN: [id number]    Subjective: Ate well at breakfast, sat up with therapies yesterday. Feels less confused.  Objective: Vital signs in last 24 hours: Temp:  [98 F (36.7 C)-100.4 F (38 C)] 98.9 F (37.2 C) (03/03 0700) Pulse Rate:  [67-71] 71 (03/03 0743) Resp:  [19-32] 19 (03/03 0743) BP: (97-136)/(47-79) 136/79 mmHg (03/03 0743) SpO2:  [94 %-100 %] 96 % (03/03 0821) Last BM Date: 09/12/14  Intake/Output from previous day: 03/02 0701 - 03/03 0700 In: 917 [P.O.:857; I.V.:60] Out: -  Intake/Output this shift: Total I/O In: 120 [P.O.:120] Out: -   General appearance: cooperative Neck: collar Chest wall: right sided chest wall tenderness Cardio: irregularly irregular rhythm GI: soft, NT, ND Extremities: mult mepilex type dressings Neuro: more alert, answers questions well, F/C  Lab Results: CBC   Recent Labs  09/13/14 1626 09/14/14 0311  WBC 10.5 10.7*  HGB 10.5* 10.3*  HCT 33.0* 32.0*  PLT 166 138*   BMET  Recent Labs  09/13/14 0300 09/14/14 0311  NA 140 139  K 4.1 4.0  CL 104 103  CO2 28 27  GLUCOSE 100* 108*  BUN 14 13  CREATININE 0.82 0.81  CALCIUM 8.5 8.3*   PT/INR  Recent Labs  09/13/14 0300 09/15/14 0247  LABPROT 30.6* 21.3*  INR 2.91* 1.83*   ABG No results for input(s): PHART, HCO3 in the last 72 hours.  Invalid input(s): PCO2, PO2  Studies/Results: No results found.  Anti-infectives: Anti-infectives    Start     Dose/Rate Route Frequency Ordered Stop   09/13/14 2000  ciprofloxacin (CIPRO) tablet 500 mg  Status:  Discontinued     500 mg Oral 2 times daily 09/13/14 1525 09/15/14 0947      Assessment/Plan: Fall C2 FX - collar per Dr. Jordan LikesPool T11 compression FX - Mobilize without brace and see how she does per Dr. Jordan LikesPool. PT/OT R Rib FX 8,9 - pulmonary toilet ABL anemia Dementia Umbilical hernia Chronic diastolic heart failure/Pacer A fib on anticoagulation -  resume home dose Coumadin UTI - urine CX neg, D/C CIpro FEN - better PO intake DIspo - Transfer to floor still P, SNF placement   LOS: 3 days    Violeta GelinasBurke Abdulloh Ullom, MD, MPH, FACS Trauma: [phone] General Surgery: [phone]  09/15/2014

## 2014-09-15 NOTE — Clinical Social Work Placement (Addendum)
Clinical Social Work Department CLINICAL SOCIAL WORK PLACEMENT NOTE 09/15/2014  Patient:  [name],[name]  Account Number:  [id number] Admit date:  09/12/2014  Clinical Social Worker:  Macario GoldsJESSE Robinn Overholt, LCSW  Date/time:  09/15/2014 10:00 AM  Clinical Social Work is seeking post-discharge placement for this patient at the following level of care:   SKILLED NURSING   (*CSW will update this form in Epic as items are completed)   09/15/2014  Patient/family provided with [facility] Department of Clinical Social Work's list of facilities offering this level of care within the geographic area requested by the patient (or if unable, by the patient's family).  09/15/2014  Patient/family informed of their freedom to choose among providers that offer the needed level of care, that participate in Medicare, Medicaid or managed care program needed by the patient, have an available bed and are willing to accept the patient.  09/15/2014  Patient/family informed of [facility]' ownership interest in [facility], as well as of the fact that they are under no obligation to receive care at this facility.  PASARR submitted to EDS on 09/14/2014 PASARR number received on 09/14/2014  FL2 transmitted to all facilities in geographic area requested by pt/family on  09/14/2014 FL2 transmitted to all facilities within larger geographic area on   Patient informed that his/her managed care company has contracts with or will negotiate with  certain facilities, including the following:     Patient/family informed of bed offers received:  09/15/2014 [facility] Physician recommends and patient chooses bed at    [facility] on  09/16/2014 Patient to be transferred to facility by [facility] Patient and family notified of transfer on  09/16/2014 Name of family member notified:  Arlina RobesNichole - patient daughter over the phone  The following physician request were  entered in Epic:   Additional Comments:

## 2014-09-16 LAB — PROTIME-INR
INR: 1.37 (ref 0.00–1.49)
Prothrombin Time: 17 seconds — ABNORMAL HIGH (ref 11.6–15.2)

## 2014-09-16 MED ORDER — ALBUTEROL SULFATE (2.5 MG/3ML) 0.083% IN NEBU: 3.0000 mL | INHALATION_SOLUTION | RESPIRATORY_TRACT | Status: DC | PRN

## 2014-09-16 MED ORDER — ACETAMINOPHEN 325 MG PO TABS: 650.0000 mg | ORAL_TABLET | Freq: Four times a day (QID) | ORAL | Status: DC | PRN

## 2014-09-16 MED ORDER — ALBUTEROL SULFATE (2.5 MG/3ML) 0.083% IN NEBU: 2.5000 mg | INHALATION_SOLUTION | Freq: Four times a day (QID) | RESPIRATORY_TRACT | Status: DC

## 2014-09-16 NOTE — Progress Notes (Signed)
Report given to [facility]Liz [facility]. Patient waiting for transport.  Sim BoastHavy, RN

## 2014-09-16 NOTE — Progress Notes (Signed)
Pain medicine offered to patient multiple times throughout the night and patient refused.

## 2014-09-16 NOTE — Progress Notes (Signed)
Patient ID: Lisa MansBarbara L Schuenemann, female   DOB: Nov 19, 1930, 81 y.o.   MRN: [id number]   LOS: 4 days   Subjective: No new c/o.   Objective: Vital signs in last 24 hours: Temp:  [98 F (36.7 C)-99.2 F (37.3 C)] 99.2 F (37.3 C) (03/04 0821) Pulse Rate:  [68-73] 70 (03/04 0821) Resp:  [16-19] 18 (03/04 0821) BP: (97-146)/(46-60) 136/47 mmHg (03/04 0821) SpO2:  [90 %-100 %] 97 % (03/04 1027) Last BM Date: 09/13/14   Physical Exam General appearance: alert and no distress Resp: clear to auscultation bilaterally Cardio: regular rate and rhythm GI: normal findings: bowel sounds normal and soft, non-tender   Assessment/Plan: Fall C2 FX - collar per Dr. Jordan LikesPool T11 compression FX - Mobilize without brace and see how she does per Dr. Jordan LikesPool. PT/OT R Rib FX 8,9 - pulmonary toilet ABL anemia Dementia Umbilical hernia Chronic diastolic heart failure/Pacer A fib on anticoagulation - Home dose Coumadin DIspo - D/C to SNF    Freeman CaldronMichael J. Cristy Colmenares, PA-C Pager: [phone] General Trauma PA Pager: [phone]  09/16/2014

## 2014-09-16 NOTE — Discharge Summary (Signed)
Physician Discharge Summary  Patient ID: [name] MRN: [id number] DOB/AGE: September 06, 1930 81 y.o.  Admit date: 09/12/2014 Discharge date: 09/16/2014  Discharge Diagnoses Patient Active Problem List   Diagnosis Date Noted  . Fall 09/14/2014  . Multiple fractures of ribs of right side 09/14/2014  . T11 vertebral fracture 09/13/2014  . Acute blood loss anemia 09/13/2014  . C2 cervical fracture 09/12/2014  . Pacemaker 03/08/2014  . Chronic diastolic heart failure 01/15/2011  . Fitting or adjustment of biventricular cardiac pacemaker 01/15/2011  . URI, ACUTE 12/29/2007  . TOBACCO ABUSE 09/11/2007  . Atrial fibrillation 09/11/2007  . COPD with emphysema 09/11/2007  . PULMONARY NODULE 09/11/2007    Consultants Dr. Julio SicksHenry Pool for neurosurgery   Procedures None   HPI: Britta MccreedyBarbara suffered a mechanical fall down several steps. She denied loss of consciousness. She was brought to the ED and made a level 2 by the EDP because of some hypoxia. That recovered with some oxygen though she remained a bit tachypneic. Her workup included CT scans of the head, cervical spine, chest, abdomen, and pelvis and showed the above-mentioned fractures. Neurosurgery was consulted and she was admitted to the trauma service.   Hospital Course: Neurosurgery recommended non-operative treatment of her neck and back fractures. He prescribed a cervical collar but no other bracing. She had some confusion her first couple of days and her urine was suspicious for a UTI. She was placed on empiric antibiotics but a culture was negative and they were stopped. In any event her confusion mostly passed. She was mobilized with physical and occupational therapies who recommended skilled nursing facility placement. Once an appropriate facility was located she was discharged there in good condition.      Medication List    STOP taking these medications        SPIRIVA HANDIHALER 18 MCG inhalation capsule  Generic drug:   tiotropium      TAKE these medications        acetaminophen 325 MG tablet  Commonly known as:  TYLENOL  Take 2 tablets (650 mg total) by mouth every 6 (six) hours as needed for mild pain or fever.     albuterol 108 (90 BASE) MCG/ACT inhaler  Commonly known as:  PROVENTIL HFA;VENTOLIN HFA  Inhale 2 puffs into the lungs every 6 (six) hours as needed for wheezing or shortness of breath.     budesonide-formoterol 160-4.5 MCG/ACT inhaler  Commonly known as:  SYMBICORT  Inhale 2 puffs into the lungs 2 (two) times daily.     dextromethorphan-guaiFENesin 30-600 MG per 12 hr tablet  Commonly known as:  MUCINEX DM  Take 2 tablets by mouth 2 (two) times daily as needed for cough.     escitalopram 20 MG tablet  Commonly known as:  LEXAPRO  Take 20 mg by mouth daily.     warfarin 5 MG tablet  Commonly known as:  COUMADIN  Take 2.5-5 mg by mouth daily. 5mg  on Monday and Wednesday and 2.5mg  all other days            Follow-up Information    Follow up with Temple PaciniPOOL,HENRY A, MD.   Specialty:  Neurosurgery   Contact information:   [address] [place] [address] [phone]       Follow up with [facility].   Why:  As needed   Contact information:   [address] [place] [id number] [phone]  Signed: Freeman Caldron, PA-C Pager: [phone] General Trauma PA Pager: [phone] 09/16/2014, 10:39 AM

## 2014-09-16 NOTE — Progress Notes (Signed)
Medicare IM (Important Message) delivered to patient today by me in anticipation of discharge at 0940am today.  Carlyle LipaMichelle Winthrop Shannahan, RN BSN MHA CCM  Case Manager, Trauma Service/Unit 67M [phone]

## 2014-09-16 NOTE — Clinical Social Work Note (Signed)
Clinical Social Worker facilitated patient discharge including contacting patient family and facility to confirm patient discharge plans.  Clinical information faxed to facility and family agreeable with plan.  CSW arranged ambulance transport via [facility] to [facility].  RN to call report prior to discharge.  Clinical Social Worker will sign off for now as social work intervention is no longer needed. Please consult us again if new need arises.  Jesse Philander Ake, LCSW [phone] 

## 2014-09-20 ENCOUNTER — Non-Acute Institutional Stay (SKILLED_NURSING_FACILITY): Payer: Medicare Other | Admitting: Adult Health

## 2014-09-20 DIAGNOSIS — S22089S Unspecified fracture of T11-T12 vertebra, sequela: Secondary | ICD-10-CM | POA: Diagnosis not present

## 2014-09-20 DIAGNOSIS — F329 Major depressive disorder, single episode, unspecified: Secondary | ICD-10-CM | POA: Diagnosis not present

## 2014-09-20 DIAGNOSIS — S2241XS Multiple fractures of ribs, right side, sequela: Secondary | ICD-10-CM | POA: Diagnosis not present

## 2014-09-20 DIAGNOSIS — F32A Depression, unspecified: Secondary | ICD-10-CM

## 2014-09-20 DIAGNOSIS — J439 Emphysema, unspecified: Secondary | ICD-10-CM | POA: Diagnosis not present

## 2014-09-20 DIAGNOSIS — S12100S Unspecified displaced fracture of second cervical vertebra, sequela: Secondary | ICD-10-CM | POA: Diagnosis not present

## 2014-09-20 DIAGNOSIS — I482 Chronic atrial fibrillation, unspecified: Secondary | ICD-10-CM

## 2014-09-21 ENCOUNTER — Non-Acute Institutional Stay (SKILLED_NURSING_FACILITY): Payer: Medicare Other | Admitting: Internal Medicine

## 2014-09-21 DIAGNOSIS — J439 Emphysema, unspecified: Secondary | ICD-10-CM | POA: Diagnosis not present

## 2014-09-21 DIAGNOSIS — D649 Anemia, unspecified: Secondary | ICD-10-CM | POA: Diagnosis not present

## 2014-09-21 DIAGNOSIS — S22089S Unspecified fracture of T11-T12 vertebra, sequela: Secondary | ICD-10-CM

## 2014-09-21 DIAGNOSIS — S12100S Unspecified displaced fracture of second cervical vertebra, sequela: Secondary | ICD-10-CM

## 2014-09-21 DIAGNOSIS — F329 Major depressive disorder, single episode, unspecified: Secondary | ICD-10-CM

## 2014-09-21 DIAGNOSIS — R5381 Other malaise: Secondary | ICD-10-CM | POA: Diagnosis not present

## 2014-09-21 DIAGNOSIS — I482 Chronic atrial fibrillation, unspecified: Secondary | ICD-10-CM

## 2014-09-21 DIAGNOSIS — F32A Depression, unspecified: Secondary | ICD-10-CM

## 2014-09-21 DIAGNOSIS — S2231XS Fracture of one rib, right side, sequela: Secondary | ICD-10-CM

## 2014-09-21 DIAGNOSIS — D72829 Elevated white blood cell count, unspecified: Secondary | ICD-10-CM | POA: Diagnosis not present

## 2014-09-21 NOTE — Progress Notes (Signed)
Patient ID: Lisa MansBarbara L Arellano, female   DOB: [birth year], 81 y.o.   MRN: [id number]     [facility]   PCP: Lupita RaiderSHAW,KIMBERLEE, MD  No Known Allergies  Chief Complaint  Patient presents with  . New Admit To SNF     HPI:  81 year old patient is here for short term rehabilitation post hospital admission from 09/12/14-09/16/14 post fall with T11 vertebral fracture, multiple right ribs fracture and C2 cervical fracture. Neurosurgery was consulted and non operative treatment was recommended. She is seen in her room today. She has a cervical collar for her neck. She denies any concerns this visit. Pain under control with current pain medications.  She has PMH of copd, pulmonary nodule, afib, chf and has pacemaker.  Review of Systems:  Constitutional: Negative for fever, chills, diaphoresis.  HENT: Negative for headache, congestion Eyes: Negative for eye pain, blurred vision, double vision and discharge.  Respiratory: Negative for cough, shortness of breath and wheezing.   Cardiovascular: positive for chest pain with deep inspiration and movement. Negative for  palpitations, leg swelling.  Gastrointestinal: Negative for heartburn, nausea, vomiting, abdominal pain. Had a bowel movement today Genitourinary: Negative for dysuria Musculoskeletal: Negative for falls in facility.  Skin: Negative for itching, rash.  Neurological: Negative for dizziness, tingling, numbness, focal weakness Psychiatric/Behavioral: Negative for depression   Past Medical History  Diagnosis Date  . Tobacco abuse   . Paroxysmal atrial fibrillation     AICD/pacemaker  . Pulmonary nodule   . COPD (chronic obstructive pulmonary disease)     Spirometry 06/10/05, FEV1 46% and ratio 51% with 14% better after albuterol   Past Surgical History  Procedure Laterality Date  . Pacemaker insertion    . Soft tissue tumor resection      From right forearm  . Skin graft    . Biv pacemaker  generator change out N/A 07/28/2014    Procedure: BIV PACEMAKER GENERATOR CHANGE OUT;  Surgeon: Marinus MawGregg W Taylor, MD;  [facility];  Service: Cardiovascular;  Laterality: N/A;   Social History:   reports that she has been smoking.  She has never used smokeless tobacco. She reports that she does not drink alcohol. Her drug history is not on file.  Family History  Problem Relation Age of Onset  . Other Neg Hx     Respiratory disease    Medications: Patient's Medications  New Prescriptions   No medications on file  Previous Medications   ACETAMINOPHEN (TYLENOL) 325 MG TABLET    Take 2 tablets (650 mg total) by mouth every 6 (six) hours as needed for mild pain or fever.   ALBUTEROL (PROVENTIL HFA;VENTOLIN HFA) 108 (90 BASE) MCG/ACT INHALER    Inhale 2 puffs into the lungs every 6 (six) hours as needed for wheezing or shortness of breath.   BUDESONIDE-FORMOTEROL (SYMBICORT) 160-4.5 MCG/ACT INHALER    Inhale 2 puffs into the lungs 2 (two) times daily.   DEXTROMETHORPHAN-GUAIFENESIN (MUCINEX DM) 30-600 MG PER 12 HR TABLET    Take 2 tablets by mouth 2 (two) times daily as needed for cough.   ESCITALOPRAM (LEXAPRO) 20 MG TABLET    Take 20 mg by mouth daily.   WARFARIN (COUMADIN) 5 MG TABLET    Take 2.5-5 mg by mouth daily. 5mg  on Monday and Wednesday and 2.5mg  all other days  Modified Medications   No medications on file  Discontinued Medications   No medications on file     Physical Exam:  Filed Vitals:   09/21/14 1638  BP: 135/71  Pulse: 89  Temp: 98.9 F (37.2 C)  Resp: 16  Weight: 117 lb 6.4 oz (53.252 kg)  SpO2: 98%    General- elderly female, in no acute distress Head- normocephalic, atraumatic Throat- moist mucus membrane Eyes- PERRLA, EOMI, no pallor, no icterus, no discharge, normal conjunctiva, normal sclera Neck- no cervical collar in place Chest- no chest wall deformities, chest wall tenderness on right side Cardiovascular- normal s1,s2, no murmurs, palpable  dorsalis pedis , no leg edema Respiratory- bilateral clear to auscultation, no wheeze, no rhonchi, no crackles, no use of accessory muscles Abdomen- bowel sounds present, soft, non tender Musculoskeletal- able to move all 4 extremities, generalized weakness  Neurological- no focal deficit Skin- warm and dry, left outer calf skin tear and left forearm skin tear present Psychiatry- alert and oriented    Labs reviewed: Basic Metabolic Panel:  Recent Labs  16/10/96 0914 09/12/14 0925 09/13/14 0300 09/14/14 0311  NA 140 140 140 139  K 4.4 4.4 4.1 4.0  CL 105 103 104 103  CO2 27  --  28 27  GLUCOSE 119* 118* 100* 108*  BUN CREATININE 0.93 0.90 0.82 0.81  CALCIUM 8.6  --  8.5 8.3*   Liver Function Tests:  Recent Labs  09/12/14 0914  AST 27  ALT 19  ALKPHOS 151*  BILITOT 1.1  PROT 6.7  ALBUMIN 3.6   No results for input(s): LIPASE, AMYLASE in the last 8760 hours. No results for input(s): AMMONIA in the last 8760 hours. CBC:  Recent Labs  07/12/14 1124  09/12/14 0914  09/13/14 0300 09/13/14 1626 09/14/14 0311  WBC 6.6  < > 11.7*  --  9.6 10.5 10.7*  NEUTROABS 4.1  --  10.2*  --   --   --   --   HGB 13.9  < > 12.9  < > 10.4* 10.5* 10.3*  HCT 43.1  < > 40.9  < > 32.2* 33.0* 32.0*  MCV 87.2  < > 90.3  --  88.7 88.9 89.4  PLT 155.0  < > 171  --  167 166 138*  < > = values in this interval not displayed.   Assessment/Plan  Physical deconditioning Will have patient work with PT/OT as tolerated to regain strength and restore function.  Fall precautions are in place.  c2 fracture No surgery done, has cervical collar in place. Continue tylenol 650 mg q6h prn pain  Multiple right rib fracture Continue prn tylenol for pain  T11 fracture Continue prn pain medication. Has f/u with neurosurgery. Continue to work with therapy team as tolerated  Anemia No active bleed. Hemodynamically stable. Monitor clinically and h&h  Leukocytosis Mild leukocytosis  on discharge. No fever, no signs of infection. Possible stress response. Monitor clinically  afib Rate controlled. Continue coumadin with goal inr 2-3, inr check 09/23/14  COPD Stable. continue Symbicort and prn albuterol  Depression continue Lexapro 20 mg daily, stable mood this visit   Goals of care: short term rehabilitation   Labs/tests ordered: cbc with diff  Family/ staff Communication: reviewed care plan with patient and nursing supervisor    Oneal Grout, MD  [facility] [phone] (Monday-Friday 8 am - 5 pm) [phone] (afterhours)

## 2014-09-28 ENCOUNTER — Encounter: Payer: Self-pay | Admitting: Adult Health

## 2014-09-28 DIAGNOSIS — F32A Depression, unspecified: Secondary | ICD-10-CM | POA: Insufficient documentation

## 2014-09-28 DIAGNOSIS — F329 Major depressive disorder, single episode, unspecified: Secondary | ICD-10-CM | POA: Insufficient documentation

## 2014-09-28 NOTE — Progress Notes (Signed)
Patient ID: Celine MansBarbara L Nudd, female   DOB: 19-Oct-1930, 81 y.o.   MRN: [id number]   09/20/14  Facility:  Nursing Home Location:  [facility] Room Number: 1008-P LEVEL OF CARE:  SNF (31)   Chief Complaint  Patient presents with  . Hospitalization Follow-up    Multiple fractures of ribs of right side, T11 vertebral fractures, C2 cervical fracture, COPD, depression and atrial fibrillation    HISTORY OF PRESENT ILLNESS:  This is an 81 year old female was [facility]. She has PMH of COPD, pulmonary nodule and paroxysmal atrial fibrillation. She had a mechanical fall several steps at home without loss of consciousness. She sustained multiple fracture of ribs on the right side, T11 vertebral fracture and C2 cervical fracture. Neurosurgery recommended nonoperative treatment of her neck and back fracture. Cervical collar has been prescribed.  She has been admitted for a short-term rehabilitation.  PAST MEDICAL HISTORY:  Past Medical History  Diagnosis Date  . Tobacco abuse   . Paroxysmal atrial fibrillation     AICD/pacemaker  . Pulmonary nodule   . COPD (chronic obstructive pulmonary disease)     Spirometry 06/10/05, FEV1 46% and ratio 51% with 14% better after albuterol    CURRENT MEDICATIONS: Reviewed per MAR/see medication list  No Known Allergies   REVIEW OF SYSTEMS:  GENERAL: no change in appetite, no fatigue, no weight changes, no fever, chills or weakness RESPIRATORY: no cough, SOB, DOE, wheezing, hemoptysis CARDIAC: no chest pain, edema or palpitations GI: no abdominal pain, diarrhea, constipation, heart burn, nausea or vomiting  PHYSICAL EXAMINATION  GENERAL: no acute distress, normal body habitus EYES: conjunctivae normal, sclerae normal, normal eye lids NECK: supple, trachea midline, no neck masses, no thyroid tenderness, no thyromegaly, has cervical collar LYMPHATICS: no LAN in the  neck, no supraclavicular LAN RESPIRATORY: breathing is even & unlabored, BS CTAB CARDIAC: RRR, no murmur,no extra heart sounds, no edema, left chest pacemaker GI: abdomen soft, normal BS, no masses, no tenderness, no hepatomegaly, no splenomegaly EXTREMITIES: able to move 4 extremities PSYCHIATRIC: the patient is alert & oriented to person, affect & behavior appropriate  LABS/RADIOLOGY: Labs reviewed: Basic Metabolic Panel:  Recent Labs  21/30/8602/29/16 0914 09/12/14 0925 09/13/14 0300 09/14/14 0311  NA 140 140 140 139  K 4.4 4.4 4.1 4.0  CL 105 103 104 103  CO2 27  --  28 27  GLUCOSE 119* 118* 100* 108*  BUN 11 16 14 13   CREATININE 0.93 0.90 0.82 0.81  CALCIUM 8.6  --  8.5 8.3*   Liver Function Tests:  Recent Labs  09/12/14 0914  AST 27  ALT 19  ALKPHOS 151*  BILITOT 1.1  PROT 6.7  ALBUMIN 3.6   CBC:  Recent Labs  07/12/14 1124  09/12/14 0914  09/13/14 0300 09/13/14 1626 09/14/14 0311  WBC 6.6  < > 11.7*  --  9.6 10.5 10.7*  NEUTROABS 4.1  --  10.2*  --   --   --   --   HGB 13.9  < > 12.9  < > 10.4* 10.5* 10.3*  HCT 43.1  < > 40.9  < > 32.2* 33.0* 32.0*  MCV 87.2  < > 90.3  --  88.7 88.9 89.4  PLT 155.0  < > 171  --  167 166 138*  < > = values in this interval not displayed.   Ct Head Wo Contrast  09/12/2014   CLINICAL DATA:  Head injury and hematoma after fall down steps. No report of loss of consciousness.  EXAM: CT HEAD WITHOUT CONTRAST  CT CERVICAL SPINE WITHOUT CONTRAST  TECHNIQUE: Multidetector CT imaging of the head and cervical spine was performed following the standard protocol without intravenous contrast. Multiplanar CT image reconstructions of the cervical spine were also generated.  COMPARISON:  None.  FINDINGS: CT HEAD FINDINGS  Bony calvarium appears intact. Mild diffuse cortical atrophy is noted. Mild chronic ischemic white matter disease is noted. No mass effect or midline shift is noted. Ventricular size is within normal limits. There is no  evidence of mass lesion or hemorrhage. Low density is noted in the right cerebellar hemisphere concerning for infarction of indeterminate age.  CT CERVICAL SPINE FINDINGS  Mildly displaced fracture is seen involving the right side of the C2 vertebral body which extends into vertebral artery foramen. There also appears to be a nondisplaced fracture involving the left side of the C2 vertebral body at the base of the dens. There appears to be malalignment or some degree of rotary subluxation of C1-2 which may be traumatic in origin. Degenerative disc disease is noted at C3-4, C4-5, C5-6 and C6-7.  IMPRESSION: Mild diffuse cortical atrophy. Mild chronic ischemic white matter disease. Low density is noted in right cerebellar hemisphere concerning for infarction of indeterminate age. MRI may be performed for further evaluation.  Mildly displaced fracture is seen involving the right side of the C2 vertebral body which extends into the vertebral artery foramen. There also appears to be a nondisplaced fracture involving the left side of the C2 vertebral body at the base of the dens. Malalignment or rotary subluxation of the atlantoaxial joint is noted which may be traumatic in origin. Critical Value/emergent results were called by telephone at the time of interpretation on 09/12/2014 at 10:12 am to Dr. Blane Ohara , who verbally acknowledged these results.   Electronically Signed   By: Lupita Raider, M.D.   On: 09/12/2014 10:15   Ct Chest W Contrast  09/12/2014   CLINICAL DATA:  Multiple trauma after falling down several steps. Scalp hematoma. Back pain.  EXAM: CT CHEST, ABDOMEN, AND PELVIS WITH CONTRAST  TECHNIQUE: Multidetector CT imaging of the chest, abdomen and pelvis was performed following the standard protocol during bolus administration of intravenous contrast.  CONTRAST:  80mL OMNIPAQUE IOHEXOL 300 MG/ML  SOLN  COMPARISON:  Radiographs dated 02/29/2015  FINDINGS: CT CHEST FINDINGS  There is an acute  compression fracture of the T11 vertebral body with 4 mm of protrusion of the posterior aspect of T11 into the spinal canal.  There are multiple old bilateral rib fractures. There are fractures of the lateral aspects of the right eighth and ninth ribs which could be acute. There is no pneumothorax. There are patchy areas of infiltrate or atelectasis at the left lung base.  There are multiple small pulmonary nodules. The nodules on the right are unchanged. There is a new 3 mm nodule in the left lower lobe on image 26 of series 202. There is a stable 4 mm nodule in the left lower lobe on image 27 of 2 O2. There is a new 3 mm nodule on image 39.  There is extensive calcification in the thoracic aorta. Overall heart size is normal. Pacemaker in place.  There is a small left pleural effusion and a tiny right effusion.  CT ABDOMEN AND PELVIS FINDINGS  Liver, spleen, pancreas, and right adrenal gland are normal. There is an inhomogeneous 3.3  x 1.8 x 1.5 cm lesion in the left adrenal gland, minimally increased in size since 2007.  There is a 3.7 x 3.6 x 3.5 cm angiomyolipoma arising from the posterior aspect of the mid right kidney. There are several small cysts in the right kidney. There are also several cysts in the left kidney with the largest being 3.1 in the upper pole.  The bowel is normal except for an umbilical hernia containing a small portion of the transverse colon and fecal impaction in the rectum.  The bladder, uterus, and ovaries are normal except for calcifications in the uterus.  There is multilevel degenerative disc and joint disease in the lumbar spine with moderate spinal stenosis at L4-5 due to spondylolisthesis.  IMPRESSION:: IMPRESSION: 1. Moderate acute compression fracture of T11 with slight protrusion of bone into the spinal canal. 2. Possible acute fractures of the lateral aspects of the right eighth and ninth ribs. 3. Patchy areas of atelectasis or infiltrate at the left lung base. Multiple  pulmonary nodules, most likely benign and most of which are stable. 4. Chronic mass in the left adrenal gland, minimally progressed since the prior study and probably representing an adenoma. 5. 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   Electronically Signed   By: Francene Boyers M.D.   On: 09/12/2014 10:28   Ct Cervical Spine Wo Contrast  09/12/2014   CLINICAL DATA:  Head injury and hematoma after fall down steps. No report of loss of consciousness.  EXAM: CT HEAD WITHOUT CONTRAST  CT CERVICAL SPINE WITHOUT CONTRAST  TECHNIQUE: Multidetector CT imaging of the head and cervical spine was performed following the standard protocol without intravenous contrast. Multiplanar CT image reconstructions of the cervical spine were also generated.  COMPARISON:  None.  FINDINGS: CT HEAD FINDINGS  Bony calvarium appears intact. Mild diffuse cortical atrophy is noted. Mild chronic ischemic white matter disease is noted. No mass effect or midline shift is noted. Ventricular size is within normal limits. There is no evidence of mass lesion or hemorrhage. Low density is noted in the right cerebellar hemisphere concerning for infarction of indeterminate age.  CT CERVICAL SPINE FINDINGS  Mildly displaced fracture is seen involving the right side of the C2 vertebral body which extends into vertebral artery foramen. There also appears to be a nondisplaced fracture involving the left side of the C2 vertebral body at the base of the dens. There appears to be malalignment or some degree of rotary subluxation of C1-2 which may be traumatic in origin. Degenerative disc disease is noted at C3-4, C4-5, C5-6 and C6-7.  IMPRESSION: Mild diffuse cortical atrophy. Mild chronic ischemic white matter disease. Low density is noted in right cerebellar hemisphere concerning for infarction of indeterminate age. MRI may be performed for further evaluation.  Mildly displaced fracture is seen involving the right side of the C2 vertebral  body which extends into the vertebral artery foramen. There also appears to be a nondisplaced fracture involving the left side of the C2 vertebral body at the base of the dens. Malalignment or rotary subluxation of the atlantoaxial joint is noted which may be traumatic in origin. Critical Value/emergent results were called by telephone at the time of interpretation on 09/12/2014 at 10:12 am to Dr. Blane Ohara , who verbally acknowledged these results.   Electronically Signed   By: Lupita Raider, M.D.   On: 09/12/2014 10:15   Ct Abdomen Pelvis W Contrast  09/12/2014   CLINICAL DATA:  Multiple  trauma after falling down several steps. Scalp hematoma. Back pain.  EXAM: CT CHEST, ABDOMEN, AND PELVIS WITH CONTRAST  TECHNIQUE: Multidetector CT imaging of the chest, abdomen and pelvis was performed following the standard protocol during bolus administration of intravenous contrast.  CONTRAST:  80mL OMNIPAQUE IOHEXOL 300 MG/ML  SOLN  COMPARISON:  Radiographs dated 02/29/2015  FINDINGS: CT CHEST FINDINGS  There is an acute compression fracture of the T11 vertebral body with 4 mm of protrusion of the posterior aspect of T11 into the spinal canal.  There are multiple old bilateral rib fractures. There are fractures of the lateral aspects of the right eighth and ninth ribs which could be acute. There is no pneumothorax. There are patchy areas of infiltrate or atelectasis at the left lung base.  There are multiple small pulmonary nodules. The nodules on the right are unchanged. There is a new 3 mm nodule in the left lower lobe on image 26 of series 202. There is a stable 4 mm nodule in the left lower lobe on image 27 of 2 O2. There is a new 3 mm nodule on image 39.  There is extensive calcification in the thoracic aorta. Overall heart size is normal. Pacemaker in place.  There is a small left pleural effusion and a tiny right effusion.  CT ABDOMEN AND PELVIS FINDINGS  Liver, spleen, pancreas, and right adrenal gland are  normal. There is an inhomogeneous 3.3 x 1.8 x 1.5 cm lesion in the left adrenal gland, minimally increased in size since 2007.  There is a 3.7 x 3.6 x 3.5 cm angiomyolipoma arising from the posterior aspect of the mid right kidney. There are several small cysts in the right kidney. There are also several cysts in the left kidney with the largest being 3.1 in the upper pole.  The bowel is normal except for an umbilical hernia containing a small portion of the transverse colon and fecal impaction in the rectum.  The bladder, uterus, and ovaries are normal except for calcifications in the uterus.  There is multilevel degenerative disc and joint disease in the lumbar spine with moderate spinal stenosis at L4-5 due to spondylolisthesis.  IMPRESSION:: IMPRESSION: 1. Moderate acute compression fracture of T11 with slight protrusion of bone into the spinal canal. 2. Possible acute fractures of the lateral aspects of the right eighth and ninth ribs. 3. Patchy areas of atelectasis or infiltrate at the left lung base. Multiple pulmonary nodules, most likely benign and most of which are stable. 4. Chronic mass in the left adrenal gland, minimally progressed since the prior study and probably representing an adenoma. 5. 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   Electronically Signed   By: Francene Boyers M.D.   On: 09/12/2014 10:28   Dg Pelvis Portable  09/12/2014   CLINICAL DATA:  Acute pelvic pain after fall this morning. Initial encounter.  EXAM: PORTABLE PELVIS 1-2 VIEWS  COMPARISON:  None.  FINDINGS: There is no evidence of pelvic fracture or diastasis. No pelvic bone lesions are seen. Hip and sacroiliac joints appear normal.  IMPRESSION: Normal pelvis.   Electronically Signed   By: Lupita Raider, M.D.   On: 09/12/2014 09:19   Dg Chest Port 1 View  09/12/2014   CLINICAL DATA:  Patient fell earlier today with right-sided chest pain  EXAM: PORTABLE CHEST - 1 VIEW  COMPARISON:  December 14, 2012  FINDINGS:  Evidence of a degree of underlying emphysema is stable. There is no edema  or consolidation. Heart is upper normal in size with pulmonary vascularity within normal limits. Pacemaker leads are attached to the right atrium and right ventricle. No adenopathy. No pneumothorax. There is a fracture of the right posterior seventh rib. There are fractures of the antrerolateral seventh and eighth ribs.  IMPRESSION: Rib fractures on the right without pneumothorax or effusion. Underlying emphysematous change. No edema or consolidation.   Electronically Signed   By: Bretta Bang III M.D.   On: 09/12/2014 09:18    ASSESSMENT/PLAN:  Multiple fracture of ribs of right side/T11 vertebral fracture/C2 cervical fracture - neurosurgery recommended nonoperative treatment; continue cervical collar; for rehabilitation; continue Tylenol 325 mg 2 tabs = 650 mg PO every 6 hours when necessary; follow-up with Dr. Julio Sicks, neurosurgery COPD - continue Symbicort and albuterol when necessary Depression - mood is stable; continue Lexapro 20 mg by mouth daily Atrial fibrillation - rate controlled; continue Coumadin   Goals of care:  Short-term rehabilitation   Labs/test ordered:  none  Spent 50 minutes in patient care.    [facility], NP [employer] [phone]

## 2014-10-12 ENCOUNTER — Encounter: Payer: Self-pay | Admitting: Adult Health

## 2014-10-12 ENCOUNTER — Non-Acute Institutional Stay (SKILLED_NURSING_FACILITY): Payer: Medicare Other | Admitting: Adult Health

## 2014-10-12 DIAGNOSIS — S2241XS Multiple fractures of ribs, right side, sequela: Secondary | ICD-10-CM | POA: Diagnosis not present

## 2014-10-12 DIAGNOSIS — S12100S Unspecified displaced fracture of second cervical vertebra, sequela: Secondary | ICD-10-CM

## 2014-10-12 DIAGNOSIS — S22089S Unspecified fracture of T11-T12 vertebra, sequela: Secondary | ICD-10-CM

## 2014-10-12 DIAGNOSIS — I482 Chronic atrial fibrillation, unspecified: Secondary | ICD-10-CM

## 2014-10-12 DIAGNOSIS — F329 Major depressive disorder, single episode, unspecified: Secondary | ICD-10-CM

## 2014-10-12 DIAGNOSIS — J439 Emphysema, unspecified: Secondary | ICD-10-CM

## 2014-10-12 DIAGNOSIS — F32A Depression, unspecified: Secondary | ICD-10-CM

## 2014-10-12 NOTE — Progress Notes (Signed)
Patient ID: Celine MansBarbara L Beaver, female   DOB: Aug 22, 1930, 81 y.o.   MRN: [id number]   10/12/14  Facility:  Nursing Home Location:  [facility] Room Number: 1008-P LEVEL OF CARE:  SNF (31)   Chief Complaint  Patient presents with  . Discharge Note    Multiple fractures of ribs of right side, T11 vertebral fractures, C2 cervical fracture, COPD, depression and atrial fibrillation    HISTORY OF PRESENT ILLNESS:  This is an 81 year old female who is for discharge home with home health PT for strengthening gait and balance, ST for swallowing and diet upgrade and Nursing for Coumadin monitoring. DME:  3-in-1 bedside commode. [facility]. She has PMH of COPD, pulmonary nodule and paroxysmal atrial fibrillation. She had a mechanical fall several steps at home without loss of consciousness. She sustained multiple fracture of ribs on the right side, T11 vertebral fracture and C2 cervical fracture. Neurosurgery recommended nonoperative treatment of her neck and back fracture. Cervical collar has been prescribed.  Patient was admitted to this facility for short-term rehabilitation after the patient's recent hospitalization.  Patient has completed SNF rehabilitation and therapy has cleared the patient for discharge.  PAST MEDICAL HISTORY:  Past Medical History  Diagnosis Date  . Tobacco abuse   . Paroxysmal atrial fibrillation     AICD/pacemaker  . Pulmonary nodule   . COPD (chronic obstructive pulmonary disease)     Spirometry 06/10/05, FEV1 46% and ratio 51% with 14% better after albuterol    CURRENT MEDICATIONS: Reviewed per MAR/see medication list  No Known Allergies   REVIEW OF SYSTEMS:  GENERAL: no change in appetite, no fatigue, no weight changes, no fever, chills or weakness RESPIRATORY: no cough, SOB, DOE, wheezing, hemoptysis CARDIAC: no chest pain, edema or palpitations GI: no abdominal  pain, diarrhea, constipation, heart burn, nausea or vomiting  PHYSICAL EXAMINATION  GENERAL: no acute distress, normal body habitus NECK: supple, trachea midline, no neck masses, no thyroid tenderness, no thyromegaly, has cervical collar LYMPHATICS: no LAN in the neck, no supraclavicular LAN RESPIRATORY: breathing is even & unlabored, BS CTAB CARDIAC: RRR, no murmur,no extra heart sounds, no edema, left chest pacemaker GI: abdomen soft, normal BS, no masses, no tenderness, no hepatomegaly, no splenomegaly EXTREMITIES: able to move 4 extremities; cervical collar PSYCHIATRIC: the patient is alert & oriented to person, affect & behavior appropriate  LABS/RADIOLOGY: 09/26/14  WBC 7.1 hemoglobin 10.9 hematocrit 33.8 MCV 86.2 Labs reviewed: Basic Metabolic Panel:  Recent Labs  65/78/4602/29/16 0914 09/12/14 0925 09/13/14 0300 09/14/14 0311  NA 140 140 140 139  K 4.4 4.4 4.1 4.0  CL 105 103 104 103  CO2 27  --  28 27  GLUCOSE 119* 118* 100* 108*  BUN 11 16 14 13   CREATININE 0.93 0.90 0.82 0.81  CALCIUM 8.6  --  8.5 8.3*   Liver Function Tests:  Recent Labs  09/12/14 0914  AST 27  ALT 19  ALKPHOS 151*  BILITOT 1.1  PROT 6.7  ALBUMIN 3.6   CBC:  Recent Labs  07/12/14 1124  09/12/14 0914  09/13/14 0300 09/13/14 1626 09/14/14 0311  WBC 6.6  < > 11.7*  --  9.6 10.5 10.7*  NEUTROABS 4.1  --  10.2*  --   --   --   --   HGB 13.9  < > 12.9  < > 10.4* 10.5* 10.3*  HCT 43.1  < >  40.9  < > 32.2* 33.0* 32.0*  MCV 87.2  < > 90.3  --  88.7 88.9 89.4  PLT 155.0  < > 171  --  167 166 138*  < > = values in this interval not displayed.   ASSESSMENT/PLAN:  Multiple fracture of ribs of right side/T11 vertebral fracture/C2 cervical fracture - neurosurgery recommended nonoperative treatment; continue cervical collar; for home health PT, ST and nursing; continue Tylenol 325 mg 2 tabs = 650 mg PO every 6 hours when necessary; follow-up with Dr. Julio Sicks, neurosurgery COPD - continue  Symbicort and albuterol when necessary Depression - mood is stable; continue Lexapro 20 mg by mouth daily Atrial fibrillation - rate controlled; continue Coumadin   I have filled out patient's discharge paperwork and written prescriptions.  Patient will receive home health PT,  ST and Nursing.  DME provided: 3-in-1 bedside commode  Total discharge time: Greater than 30 minutes  Discharge time involved coordination of the discharge process with social worker, nursing staff and therapy department. Medical justification for home health services/DME verified.   [facility], NP [employer] [phone]

## 2014-10-25 ENCOUNTER — Other Ambulatory Visit: Payer: Self-pay | Admitting: Family Medicine

## 2014-10-25 DIAGNOSIS — H539 Unspecified visual disturbance: Secondary | ICD-10-CM

## 2014-10-25 DIAGNOSIS — R519 Headache, unspecified: Secondary | ICD-10-CM

## 2014-10-25 DIAGNOSIS — R51 Headache: Principal | ICD-10-CM

## 2014-10-27 ENCOUNTER — Ambulatory Visit
Admission: RE | Admit: 2014-10-27 | Discharge: 2014-10-27 | Disposition: A | Discharge status: Home / Self Care | Payer: Medicare Other | Source: Ambulatory Visit | Attending: Family Medicine | Admitting: Family Medicine

## 2014-10-27 DIAGNOSIS — H539 Unspecified visual disturbance: Secondary | ICD-10-CM

## 2014-10-27 DIAGNOSIS — R519 Headache, unspecified: Secondary | ICD-10-CM

## 2014-10-27 DIAGNOSIS — R51 Headache: Principal | ICD-10-CM

## 2014-10-28 ENCOUNTER — Other Ambulatory Visit: Payer: Self-pay

## 2014-11-01 ENCOUNTER — Encounter: Payer: Self-pay | Admitting: Internal Medicine

## 2014-11-01 ENCOUNTER — Ambulatory Visit (INDEPENDENT_AMBULATORY_CARE_PROVIDER_SITE_OTHER): Payer: Medicare Other | Admitting: Internal Medicine

## 2014-11-01 ENCOUNTER — Other Ambulatory Visit: Payer: Self-pay

## 2014-11-01 VITALS — BP 80/52 | HR 70 | Ht 62.0 in

## 2014-11-01 DIAGNOSIS — Z950 Presence of cardiac pacemaker: Secondary | ICD-10-CM

## 2014-11-01 DIAGNOSIS — I482 Chronic atrial fibrillation, unspecified: Secondary | ICD-10-CM

## 2014-11-01 DIAGNOSIS — I5032 Chronic diastolic (congestive) heart failure: Secondary | ICD-10-CM | POA: Diagnosis not present

## 2014-11-01 LAB — MDC_IDC_ENUM_SESS_TYPE_INCLINIC
Battery Remaining Longevity: 83 mo
Battery Voltage: 3.03 V
Brady Statistic AP VP Percent: 0 %
Brady Statistic AP VS Percent: 0 %
Brady Statistic AS VS Percent: 0.24 %
Brady Statistic RV Percent Paced: 99.76 %
Date Time Interrogation Session: 20160419115748
Lead Channel Impedance Value: 342 Ohm
Lead Channel Impedance Value: 361 Ohm
Lead Channel Impedance Value: 399 Ohm
Lead Channel Impedance Value: 456 Ohm
Lead Channel Impedance Value: 665 Ohm
Lead Channel Pacing Threshold Amplitude: 0.75 V
Lead Channel Pacing Threshold Amplitude: 1 V
Lead Channel Pacing Threshold Pulse Width: 0.4 ms
Lead Channel Pacing Threshold Pulse Width: 0.4 ms
Lead Channel Sensing Intrinsic Amplitude: 3.375 mV
Lead Channel Setting Pacing Amplitude: 2 V
Lead Channel Setting Pacing Amplitude: 2 V
MDC IDC MSMT LEADCHNL LV IMPEDANCE VALUE: 266 Ohm
MDC IDC MSMT LEADCHNL LV IMPEDANCE VALUE: 399 Ohm
MDC IDC MSMT LEADCHNL LV IMPEDANCE VALUE: 532 Ohm
MDC IDC MSMT LEADCHNL LV IMPEDANCE VALUE: 665 Ohm
MDC IDC SET LEADCHNL LV PACING PULSEWIDTH: 0.4 ms
MDC IDC SET LEADCHNL RV PACING PULSEWIDTH: 0.4 ms
MDC IDC SET LEADCHNL RV SENSING SENSITIVITY: 5.6 mV
MDC IDC SET ZONE DETECTION INTERVAL: 400 ms
MDC IDC SET ZONE DETECTION INTERVAL: 400 ms
MDC IDC STAT BRADY AS VP PERCENT: 99.76 %
MDC IDC STAT BRADY RA PERCENT PACED: 0 %

## 2014-11-01 NOTE — Assessment & Plan Note (Signed)
The patient's heart failure symptoms remain class II. She will maintain a low-sodium diet. No change in medications.

## 2014-11-01 NOTE — Assessment & Plan Note (Signed)
Her ventricular rate is well controlled. After much deliberation and consideration, I have recommended that she stop taking systemic anticoagulation because of her fall risk.

## 2014-11-01 NOTE — Assessment & Plan Note (Signed)
Her Medtronic biventricular pacemaker has healed nicely. Her device is working normally. We'll plan to recheck in several months.

## 2014-11-01 NOTE — Patient Instructions (Addendum)
Medication Instructions:  Your physician has recommended you make the following change in your medication:  1) STOP Coumadin   Labwork: NONE  Testing/Procedures: NONE  Follow-Up: Your physician wants you to follow-up in: 6 months with Dr. Ladona Ridgelaylor. You will receive a reminder letter in the mail two months in advance. If you don't receive a letter, please call our office to schedule the follow-up appointment.   Any Other Special Instructions Will Be Listed Below (If Applicable).

## 2014-11-01 NOTE — Progress Notes (Signed)
HPI [name] returns today for followup. She is a pleasant 81 year old woman with complete heart block, and chronic atrial fibrillation, and congestive heart failure, status post biventricular pacemaker insertion. In the interim, she has undergone pacemaker generator change out. Several weeks later, she fell and fractured her neck. She has had trouble since then.. She denies chest pain, shortness of breath except with exertion, or syncope. The patient denies chest pain. She has class II heart failure symptoms.  She has minimal peripheral edema. Her daughter who is with her today notes she is unsteady on her feet and has had several falls. No Known Allergies   Current Outpatient Prescriptions  Medication Sig Dispense Refill  . acetaminophen (TYLENOL) 325 MG tablet Take 2 tablets (650 mg total) by mouth every 6 (six) hours as needed for mild pain or fever.    . budesonide-formoterol (SYMBICORT) 160-4.5 MCG/ACT inhaler Inhale 2 puffs into the lungs 2 (two) times daily.    . cephALEXin (KEFLEX) 500 MG capsule Take 1 capsule by mouth 3 (three) times daily.    Marland Kitchen escitalopram (LEXAPRO) 20 MG tablet Take 20 mg by mouth daily.    Marland Kitchen HYDROcodone-acetaminophen (NORCO/VICODIN) 5-325 MG per tablet Take 1 tablet by mouth every 6 (six) hours as needed. pain     No current facility-administered medications for this visit.     Past Medical History  Diagnosis Date  . Tobacco abuse   . Paroxysmal atrial fibrillation     AICD/pacemaker  . Pulmonary nodule   . COPD (chronic obstructive pulmonary disease)     Spirometry 06/10/05, FEV1 46% and ratio 51% with 14% better after albuterol    ROS:   All systems reviewed and negative except as noted in the HPI.   Past Surgical History  Procedure Laterality Date  . Pacemaker insertion    . Soft tissue tumor resection      From right forearm  . Skin graft    . Biv pacemaker generator change out N/A 07/28/2014    Procedure: BIV PACEMAKER GENERATOR CHANGE  OUT;  Surgeon: Marinus Maw, MD;  Location: [facility] CATH LAB;  Service: Cardiovascular;  Laterality: N/A;     Family History  Problem Relation Age of Onset  . Dementia Mother   . Stroke Father 26  . Other Father 68    cerebral hemorrhage  . Leukemia Sister      History   Social History  . Marital Status: Divorced    Spouse Name: N/A  . Number of Children: N/A  . 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 Smoking status: Current Every Day Smoker -- 1.00 packs/day for 50 years  . Smokeless tobacco: Never Used     Comment: Counseling, down to less than half ppd as of 03/04/11  . Alcohol Use: No  . Drug Use: Not on file  . Sexual Activity: Not on file   Other Topics Concern  . Not on file   Social History Narrative   Divorced   1 daughter     BP 80/52 mmHg  Pulse 70  Ht  (1.575 m)  Physical Exam:  elderly appearing 81 year old woman, NAD HEENT: Unremarkable Neck:  7 cm JVD, no thyromegally Lungs:  Clear with no wheezes, rales, or rhonchi. Decreased breath sounds. HEART:  Regular rate rhythm, no murmurs, no rubs, no clicks Abd:  soft, positive bowel sounds, no organomegally, no rebound, no guarding Ext:  2 plus pulses, no edema, no cyanosis, no clubbing Skin:  No rashes no nodules Neuro:  CN II through XII intact, motor grossly intact  EKG - atrial fibrillation with ventricular pacing  DEVICE  Normal device function.  See PaceArt for details. She has reached elective replacement  Assess/Plan:

## 2014-11-02 ENCOUNTER — Encounter: Payer: Self-pay | Admitting: Internal Medicine

## 2015-01-31 ENCOUNTER — Telehealth: Payer: Self-pay | Admitting: Cardiology

## 2015-01-31 ENCOUNTER — Ambulatory Visit (INDEPENDENT_AMBULATORY_CARE_PROVIDER_SITE_OTHER): Payer: Medicare Other | Admitting: *Deleted

## 2015-01-31 DIAGNOSIS — I442 Atrioventricular block, complete: Secondary | ICD-10-CM

## 2015-01-31 NOTE — Telephone Encounter (Signed)
Confirmed remote transmission w/ pt daughter.   

## 2015-02-01 NOTE — Progress Notes (Signed)
Remote pacemaker transmission.   

## 2015-02-08 LAB — CUP PACEART REMOTE DEVICE CHECK
Battery Remaining Longevity: 68 mo
Brady Statistic AP VP Percent: 0 %
Brady Statistic AP VS Percent: 0 %
Brady Statistic AS VP Percent: 99.97 %
Brady Statistic AS VS Percent: 0.03 %
Lead Channel Impedance Value: 399 Ohm
Lead Channel Impedance Value: 456 Ohm
Lead Channel Impedance Value: 475 Ohm
Lead Channel Impedance Value: 627 Ohm
Lead Channel Impedance Value: 627 Ohm
Lead Channel Pacing Threshold Amplitude: 0.5 V
Lead Channel Pacing Threshold Amplitude: 0.875 V
Lead Channel Pacing Threshold Pulse Width: 0.4 ms
Lead Channel Sensing Intrinsic Amplitude: 2.875 mV
Lead Channel Setting Pacing Amplitude: 2 V
Lead Channel Setting Pacing Pulse Width: 0.4 ms
MDC IDC MSMT BATTERY VOLTAGE: 3.01 V
MDC IDC MSMT LEADCHNL LV IMPEDANCE VALUE: 266 Ohm
MDC IDC MSMT LEADCHNL RA IMPEDANCE VALUE: 342 Ohm
MDC IDC MSMT LEADCHNL RA IMPEDANCE VALUE: 418 Ohm
MDC IDC MSMT LEADCHNL RA SENSING INTR AMPL: 2.875 mV
MDC IDC MSMT LEADCHNL RV IMPEDANCE VALUE: 361 Ohm
MDC IDC MSMT LEADCHNL RV PACING THRESHOLD PULSEWIDTH: 0.4 ms
MDC IDC MSMT LEADCHNL RV SENSING INTR AMPL: 16 mV
MDC IDC MSMT LEADCHNL RV SENSING INTR AMPL: 16 mV
MDC IDC SESS DTM: 20160720023324
MDC IDC SET LEADCHNL LV PACING AMPLITUDE: 2 V
MDC IDC SET LEADCHNL LV PACING PULSEWIDTH: 0.4 ms
MDC IDC SET LEADCHNL RV SENSING SENSITIVITY: 5.6 mV
MDC IDC SET ZONE DETECTION INTERVAL: 400 ms
MDC IDC SET ZONE DETECTION INTERVAL: 400 ms
MDC IDC STAT BRADY RA PERCENT PACED: 0 %
MDC IDC STAT BRADY RV PERCENT PACED: 99.97 %

## 2015-02-17 ENCOUNTER — Encounter: Payer: Self-pay | Admitting: Cardiology

## 2015-02-24 ENCOUNTER — Encounter: Payer: Self-pay | Admitting: Internal Medicine

## 2015-03-03 ENCOUNTER — Emergency Department (HOSPITAL_COMMUNITY): Payer: Medicare Other

## 2015-03-03 ENCOUNTER — Encounter (HOSPITAL_COMMUNITY): Payer: Self-pay

## 2015-03-03 ENCOUNTER — Inpatient Hospital Stay (HOSPITAL_COMMUNITY)
Admission: EM | Admit: 2015-03-03 | Discharge: 2015-03-10 | DRG: 871 | Disposition: A | Discharge status: Hospice | Payer: Medicare Other | Attending: Internal Medicine | Admitting: Internal Medicine

## 2015-03-03 DIAGNOSIS — B9620 Unspecified Escherichia coli [E. coli] as the cause of diseases classified elsewhere: Secondary | ICD-10-CM | POA: Diagnosis present

## 2015-03-03 DIAGNOSIS — I5022 Chronic systolic (congestive) heart failure: Secondary | ICD-10-CM | POA: Diagnosis not present

## 2015-03-03 DIAGNOSIS — A419 Sepsis, unspecified organism: Secondary | ICD-10-CM | POA: Diagnosis present

## 2015-03-03 DIAGNOSIS — E874 Mixed disorder of acid-base balance: Secondary | ICD-10-CM | POA: Diagnosis present

## 2015-03-03 DIAGNOSIS — E861 Hypovolemia: Secondary | ICD-10-CM | POA: Diagnosis present

## 2015-03-03 DIAGNOSIS — R410 Disorientation, unspecified: Secondary | ICD-10-CM | POA: Diagnosis present

## 2015-03-03 DIAGNOSIS — E876 Hypokalemia: Secondary | ICD-10-CM | POA: Diagnosis present

## 2015-03-03 DIAGNOSIS — N179 Acute kidney failure, unspecified: Secondary | ICD-10-CM | POA: Diagnosis present

## 2015-03-03 DIAGNOSIS — Z79899 Other long term (current) drug therapy: Secondary | ICD-10-CM | POA: Diagnosis not present

## 2015-03-03 DIAGNOSIS — I509 Heart failure, unspecified: Secondary | ICD-10-CM | POA: Diagnosis present

## 2015-03-03 DIAGNOSIS — D649 Anemia, unspecified: Secondary | ICD-10-CM | POA: Diagnosis present

## 2015-03-03 DIAGNOSIS — J811 Chronic pulmonary edema: Secondary | ICD-10-CM

## 2015-03-03 DIAGNOSIS — Z950 Presence of cardiac pacemaker: Secondary | ICD-10-CM

## 2015-03-03 DIAGNOSIS — Z66 Do not resuscitate: Secondary | ICD-10-CM | POA: Diagnosis present

## 2015-03-03 DIAGNOSIS — I480 Paroxysmal atrial fibrillation: Secondary | ICD-10-CM | POA: Diagnosis present

## 2015-03-03 DIAGNOSIS — R1310 Dysphagia, unspecified: Secondary | ICD-10-CM | POA: Diagnosis present

## 2015-03-03 DIAGNOSIS — F17210 Nicotine dependence, cigarettes, uncomplicated: Secondary | ICD-10-CM | POA: Diagnosis present

## 2015-03-03 DIAGNOSIS — J449 Chronic obstructive pulmonary disease, unspecified: Secondary | ICD-10-CM | POA: Diagnosis present

## 2015-03-03 DIAGNOSIS — Z515 Encounter for palliative care: Secondary | ICD-10-CM | POA: Diagnosis not present

## 2015-03-03 DIAGNOSIS — G8929 Other chronic pain: Secondary | ICD-10-CM | POA: Diagnosis present

## 2015-03-03 DIAGNOSIS — N390 Urinary tract infection, site not specified: Secondary | ICD-10-CM | POA: Diagnosis present

## 2015-03-03 DIAGNOSIS — R6521 Severe sepsis with septic shock: Secondary | ICD-10-CM | POA: Diagnosis present

## 2015-03-03 DIAGNOSIS — J438 Other emphysema: Secondary | ICD-10-CM | POA: Diagnosis not present

## 2015-03-03 DIAGNOSIS — J810 Acute pulmonary edema: Secondary | ICD-10-CM | POA: Diagnosis present

## 2015-03-03 DIAGNOSIS — Z7951 Long term (current) use of inhaled steroids: Secondary | ICD-10-CM

## 2015-03-03 DIAGNOSIS — E860 Dehydration: Secondary | ICD-10-CM | POA: Diagnosis present

## 2015-03-03 DIAGNOSIS — R579 Shock, unspecified: Secondary | ICD-10-CM

## 2015-03-03 LAB — BLOOD GAS, VENOUS
ACID-BASE DEFICIT: 5.4 mmol/L — AB (ref 0.0–2.0)
ACID-BASE DEFICIT: 8.9 mmol/L — AB (ref 0.0–2.0)
BICARBONATE: 18.6 meq/L — AB (ref 20.0–24.0)
Bicarbonate: 20.8 mEq/L (ref 20.0–24.0)
DRAWN BY: 103701
FIO2: 0.3
O2 SAT: 47.8 %
O2 SAT: 78.4 %
PATIENT TEMPERATURE: 98.6
PATIENT TEMPERATURE: 99.9
PH VEN: 7.275 (ref 7.250–7.300)
PO2 VEN: 52 mmHg — AB (ref 30.0–45.0)
TCO2: 19.9 mmol/L (ref 0–100)
TCO2: 18.3 mmol/L (ref 0–100)
pCO2, Ven: 46.3 mmHg (ref 45.0–50.0)
pCO2, Ven: 52.3 mmHg — ABNORMAL HIGH (ref 45.0–50.0)
pH, Ven: 7.181 — CL (ref 7.250–7.300)

## 2015-03-03 LAB — I-STAT CG4 LACTIC ACID, ED
Lactic Acid, Venous: 2.44 mmol/L (ref 0.5–2.0)
Lactic Acid, Venous: 1.56 mmol/L (ref 0.5–2.0)

## 2015-03-03 LAB — CBC WITH DIFFERENTIAL/PLATELET
Basophils Absolute: 0 10*3/uL (ref 0.0–0.1)
Basophils Relative: 0 % (ref 0–1)
Eosinophils Absolute: 0 10*3/uL (ref 0.0–0.7)
Eosinophils Relative: 0 % (ref 0–5)
HEMATOCRIT: 37.8 % (ref 36.0–46.0)
HEMOGLOBIN: 12 g/dL (ref 12.0–15.0)
LYMPHS ABS: 0.3 10*3/uL — AB (ref 0.7–4.0)
Lymphocytes Relative: 2 % — ABNORMAL LOW (ref 12–46)
MCH: 28.9 pg (ref 26.0–34.0)
MCHC: 31.7 g/dL (ref 30.0–36.0)
MCV: 91.1 fL (ref 78.0–100.0)
MONO ABS: 0.8 10*3/uL (ref 0.1–1.0)
MONOS PCT: 4 % (ref 3–12)
NEUTROS ABS: 17.5 10*3/uL — AB (ref 1.7–7.7)
NEUTROS PCT: 94 % — AB (ref 43–77)
Platelets: 112 10*3/uL — ABNORMAL LOW (ref 150–400)
RBC: 4.15 MIL/uL (ref 3.87–5.11)
RDW: 14.2 % (ref 11.5–15.5)
WBC: 18.7 10*3/uL — ABNORMAL HIGH (ref 4.0–10.5)

## 2015-03-03 LAB — COMPREHENSIVE METABOLIC PANEL
ALBUMIN: 3.4 g/dL — AB (ref 3.5–5.0)
ALK PHOS: 109 U/L (ref 38–126)
ALT: 42 U/L (ref 14–54)
AST: 92 U/L — ABNORMAL HIGH (ref 15–41)
Anion gap: 9 (ref 5–15)
BUN: 25 mg/dL — ABNORMAL HIGH (ref 6–20)
CHLORIDE: 108 mmol/L (ref 101–111)
CO2: 23 mmol/L (ref 22–32)
CREATININE: 1.79 mg/dL — AB (ref 0.44–1.00)
Calcium: 8.2 mg/dL — ABNORMAL LOW (ref 8.9–10.3)
GFR calc non Af Amer: 25 mL/min — ABNORMAL LOW (ref >60)
GFR, EST AFRICAN AMERICAN: 29 mL/min — AB (ref >60)
GLUCOSE: 87 mg/dL (ref 65–99)
Potassium: 4.2 mmol/L (ref 3.5–5.1)
SODIUM: 140 mmol/L (ref 135–145)
Total Bilirubin: 1.4 mg/dL — ABNORMAL HIGH (ref 0.3–1.2)
Total Protein: 6.2 g/dL — ABNORMAL LOW (ref 6.5–8.1)

## 2015-03-03 LAB — URINE MICROSCOPIC-ADD ON

## 2015-03-03 LAB — URINALYSIS, ROUTINE W REFLEX MICROSCOPIC
GLUCOSE, UA: NEGATIVE mg/dL
KETONES UR: NEGATIVE mg/dL
Nitrite: POSITIVE — AB
Specific Gravity, Urine: 1.024 (ref 1.005–1.030)
UROBILINOGEN UA: 1 mg/dL (ref 0.0–1.0)
pH: 5.5 (ref 5.0–8.0)

## 2015-03-03 LAB — CBC
HCT: 33.3 % — ABNORMAL LOW (ref 36.0–46.0)
Hemoglobin: 10.7 g/dL — ABNORMAL LOW (ref 12.0–15.0)
MCH: 29.8 pg (ref 26.0–34.0)
MCHC: 32.1 g/dL (ref 30.0–36.0)
MCV: 92.8 fL (ref 78.0–100.0)
PLATELETS: 162 10*3/uL (ref 150–400)
RBC: 3.59 MIL/uL — ABNORMAL LOW (ref 3.87–5.11)
RDW: 14.6 % (ref 11.5–15.5)
WBC: 34.2 10*3/uL — ABNORMAL HIGH (ref 4.0–10.5)

## 2015-03-03 LAB — CREATININE, SERUM
Creatinine, Ser: 1.6 mg/dL — ABNORMAL HIGH (ref 0.44–1.00)
GFR calc Af Amer: 33 mL/min — ABNORMAL LOW (ref >60)
GFR calc non Af Amer: 29 mL/min — ABNORMAL LOW (ref >60)

## 2015-03-03 LAB — LIPASE, BLOOD: Lipase: 18 U/L — ABNORMAL LOW (ref 22–51)

## 2015-03-03 MED ORDER — ONDANSETRON HCL 4 MG/2ML IJ SOLN
4.0000 mg | Freq: Once | INTRAMUSCULAR | Status: AC
  Administered 2015-03-03: 4 mg via INTRAVENOUS
  Filled 2015-03-03: qty 2

## 2015-03-03 MED ORDER — MORPHINE SULFATE (PF) 4 MG/ML IV SOLN
4.0000 mg | Freq: Once | INTRAVENOUS | Status: AC
Start: 2015-03-03 — End: 2015-03-03
  Administered 2015-03-03: 4 mg via INTRAVENOUS
  Filled 2015-03-03: qty 1

## 2015-03-03 MED ORDER — ALBUTEROL SULFATE (2.5 MG/3ML) 0.083% IN NEBU
5.0000 mg | INHALATION_SOLUTION | Freq: Once | RESPIRATORY_TRACT | Status: AC
  Administered 2015-03-03: 5 mg via RESPIRATORY_TRACT
  Filled 2015-03-03: qty 6

## 2015-03-03 MED ORDER — SODIUM CHLORIDE 0.9 % IV SOLN
250.0000 mL | INTRAVENOUS | Status: DC | PRN
  Administered 2015-03-04: 250 mL via INTRAVENOUS

## 2015-03-03 MED ORDER — SODIUM CHLORIDE 0.9 % IV BOLUS (SEPSIS)
1000.0000 mL | INTRAVENOUS | Status: AC
  Administered 2015-03-03 (×2): 1000 mL via INTRAVENOUS

## 2015-03-03 MED ORDER — PIPERACILLIN-TAZOBACTAM 3.375 G IVPB
3.3750 g | Freq: Three times a day (TID) | INTRAVENOUS | Status: DC
  Administered 2015-03-04 – 2015-03-05 (×4): 3.375 g via INTRAVENOUS
  Filled 2015-03-03 (×6): qty 50

## 2015-03-03 MED ORDER — SODIUM CHLORIDE 0.9 % IV SOLN
500.0000 mg | INTRAVENOUS | Status: DC
  Administered 2015-03-04: 500 mg via INTRAVENOUS
  Filled 2015-03-03 (×2): qty 500

## 2015-03-03 MED ORDER — IPRATROPIUM BROMIDE 0.02 % IN SOLN
0.5000 mg | Freq: Once | RESPIRATORY_TRACT | Status: AC
  Administered 2015-03-03: 0.5 mg via RESPIRATORY_TRACT
  Filled 2015-03-03: qty 2.5

## 2015-03-03 MED ORDER — PIPERACILLIN-TAZOBACTAM 3.375 G IVPB 30 MIN
3.3750 g | Freq: Once | INTRAVENOUS | Status: AC
  Administered 2015-03-03: 3.375 g via INTRAVENOUS
  Filled 2015-03-03: qty 50

## 2015-03-03 MED ORDER — VANCOMYCIN HCL IN DEXTROSE 1-5 GM/200ML-% IV SOLN
1000.0000 mg | Freq: Once | INTRAVENOUS | Status: AC
  Administered 2015-03-03: 1000 mg via INTRAVENOUS
  Filled 2015-03-03: qty 200

## 2015-03-03 MED ORDER — HEPARIN SODIUM (PORCINE) 5000 UNIT/ML IJ SOLN
5000.0000 [IU] | Freq: Three times a day (TID) | INTRAMUSCULAR | Status: DC
  Administered 2015-03-04 – 2015-03-06 (×7): 5000 [IU] via SUBCUTANEOUS
  Filled 2015-03-03 (×11): qty 1

## 2015-03-03 MED ORDER — ESCITALOPRAM OXALATE 20 MG PO TABS
20.0000 mg | ORAL_TABLET | Freq: Every day | ORAL | Status: DC
  Administered 2015-03-04 – 2015-03-07 (×2): 20 mg via ORAL
  Filled 2015-03-03 (×6): qty 1

## 2015-03-03 MED ORDER — BUDESONIDE-FORMOTEROL FUMARATE 160-4.5 MCG/ACT IN AERO
2.0000 | INHALATION_SPRAY | Freq: Two times a day (BID) | RESPIRATORY_TRACT | Status: DC
  Administered 2015-03-04: 2 via RESPIRATORY_TRACT
  Filled 2015-03-03 (×2): qty 6

## 2015-03-03 MED ORDER — NOREPINEPHRINE BITARTRATE 1 MG/ML IV SOLN
0.0000 ug/min | INTRAVENOUS | Status: DC
  Administered 2015-03-03: 5 ug/min via INTRAVENOUS
  Administered 2015-03-04: 10 ug/min via INTRAVENOUS
  Administered 2015-03-04: 5 ug/min via INTRAVENOUS
  Administered 2015-03-05 – 2015-03-06 (×2): 3 ug/min via INTRAVENOUS
  Filled 2015-03-03 (×5): qty 4

## 2015-03-03 NOTE — ED Notes (Signed)
MD at bedside. EDP PLUNKETT PRESENT SPEAKING WITH PT AND FAMILY

## 2015-03-03 NOTE — ED Notes (Signed)
Per EMS- Patient has been having diarrhea x 2 days. Today, the patient c/o COB, weakness. Initial BP-80/40.Patient received 300 ml NS. BP increased to 98/61.

## 2015-03-03 NOTE — ED Notes (Signed)
rn unable to get [place] IV.

## 2015-03-03 NOTE — Progress Notes (Signed)
ANTIBIOTIC CONSULT NOTE  Pharmacy Consult for Vancomycin & Zosyn Indication: rule out sepsis  No Known Allergies  Patient Measurements: Height:  (157.5 cm) Weight: 125 lb (56.7 kg) IBW/kg (Calculated) : 50.1  Vital Signs: Temp: 99.9 F (37.7 C) (08/19 1647) Temp Source: Rectal (08/19 1647) BP: 102/10 mmHg (08/19 1900) Pulse Rate: 69 (08/19 1900) Intake/Output from previous day:   Intake/Output from this shift:    Labs:  Recent Labs  03/03/15 1715  WBC 18.7*  HGB 12.0  PLT 112*  CREATININE 1.79*   Estimated Creatinine Clearance: 19.2 mL/min (by C-G formula based on Cr of 1.79). No results for input(s): VANCOTROUGH, VANCOPEAK, VANCORANDOM, GENTTROUGH, GENTPEAK, GENTRANDOM, TOBRATROUGH, TOBRAPEAK, TOBRARND, AMIKACINPEAK, AMIKACINTROU, AMIKACIN in the last 72 hours.   Microbiology: No results found for this or any previous visit (from the past 720 hour(s)).  Anti-infectives    Start     Dose/Rate Route Frequency Ordered Stop   03/03/15 1900  piperacillin-tazobactam (ZOSYN) IVPB 3.375 g     3.375 g 100 mL/hr over 30 Minutes Intravenous  Once 03/03/15 1854     03/03/15 1900  vancomycin (VANCOCIN) IVPB 1000 mg/200 mL premix     1,000 mg 200 mL/hr over 60 Minutes Intravenous  Once 03/03/15 1854        Assessment: 81 yo F who presents with altered mental status, diarrhea, and shortness of breath.  Upon admission, she is hypotensive and tachypneic with leukocytosis however no fevers.  Empiric, broad-spectrum antibiotics are being initiated for sepsis.  Acute renal failure (baseline Scr ~ 0.8-0.9) noted. Estimated CrCl ~20 ml/min (borderline for renal dose adjustment).    8/19: CXR + emphysema and pulmonary vascular congestion.    8/19: Blood x2: IP 8/19: Urine: pending  Goal of Therapy:  Vancomycin trough level 15-20 mcg/ml  Plan:  Zosyn 3.375gm IV Q8h to be infused over 4hrs Vancomycin 1gm IV x1 (ordered by EDP) followed by  IV q24h Check Vancomycin  trough at steady state Monitor renal function and cx data   Elson Clan 03/03/2015,7:03 PM

## 2015-03-03 NOTE — ED Notes (Addendum)
Attempted manual BP x 2 w/o success.  Will ask another RN to attempt.  Will notify MD of BP.  Dr. Anitra Lauth aware of BP.

## 2015-03-03 NOTE — ED Notes (Signed)
Patient transported to CT 

## 2015-03-03 NOTE — ED Provider Notes (Addendum)
CSN: [id number]     Arrival date & time 03/03/15  1621 History   First MD Initiated Contact with Patient 03/03/15 1709     Chief Complaint  Patient presents with  . Diarrhea  . Weakness  . Shortness of Breath     (Consider location/radiation/quality/duration/timing/severity/associated sxs/prior Treatment) HPI Comments: Patient is an 81 year old female who lives with her daughter and recently was at the beach this whole last week. Daughter states around Wednesday she had one loose stool and then had 2 today. This morning at 4 AM she woke up her daughter complaining of shortness of breath and on her states she's been slightly confused today. She ran out of her Symbicort yesterday but does not take any blood pressure or diabetes medications. She does not wear home oxygen. They returned home at noon daughter administered Symbicort without improvement in symptoms. She denies any abdominal pain or vomiting. She's been eating normally except for today. Denies any new cough or change in sputum. Daughter and patient states her blood pressure always runs low but is sure how low. Patient states 90s normal for her however it's unclear if this is true.  Patient is a 81 y.o. female presenting with diarrhea, weakness, and shortness of breath. The history is provided by a relative.  Diarrhea Quality:  Watery Severity:  Moderate Onset quality:  Sudden Number of episodes:  3 Duration:  3 days Timing:  Constant Progression:  Worsening Relieved by:  None tried Worsened by:  Nothing tried Ineffective treatments:  None tried Associated symptoms: headaches   Associated symptoms: no abdominal pain, no recent cough, no fever and no vomiting   Associated symptoms comment:  Sob, confusion Risk factors: no recent antibiotic use and no sick contacts   Weakness Associated symptoms include headaches and shortness of breath. Pertinent negatives include no abdominal pain.  Shortness of Breath Associated symptoms:  headaches   Associated symptoms: no abdominal pain, no fever and no vomiting     Past Medical History  Diagnosis Date  . Tobacco abuse   . Paroxysmal atrial fibrillation     AICD/pacemaker  . Pulmonary nodule   . COPD (chronic obstructive pulmonary disease)     Spirometry 06/10/05, FEV1 46% and ratio 51% with 14% better after albuterol   Past Surgical History  Procedure Laterality Date  . Pacemaker insertion    . Soft tissue tumor resection      From right forearm  . Skin graft    . Biv pacemaker generator change out N/A 07/28/2014    Procedure: BIV PACEMAKER GENERATOR CHANGE OUT;  Surgeon: Marinus Maw, MD;  Location[facility] CATH LAB;  Service: Cardiovascular;  Laterality: N/A;   Family History  Problem Relation Age of Onset  . Dementia Mother   . Stroke Father 64  . Other Father 70    cerebral hemorrhage  . Leukemia Sister    Social History  Substance Use Topics  . Smoking status: Current Every Day Smoker -- 0.50 packs/day for 50 years    Types: Cigarettes  . Smokeless tobacco: Never Used     Comment: Counseling, down to less than half ppd as of 03/04/11  . Alcohol Use: No   OB History    No data available     Review of Systems  Constitutional: Negative for fever.  Respiratory: Positive for shortness of breath.   Gastrointestinal: Positive for diarrhea. Negative for vomiting and abdominal pain.  Neurological: Positive for weakness and headaches.  All other systems reviewed and  are negative.     Allergies  Review of patient's allergies indicates no known allergies.  Home Medications   Prior to Admission medications   Medication Sig Start Date End Date Taking? Authorizing Provider  budesonide-formoterol (SYMBICORT) 160-4.5 MCG/ACT inhaler Inhale 2 puffs into the lungs 2 (two) times daily.   Yes Historical Provider, MD  escitalopram (LEXAPRO) 20 MG tablet Take 20 mg by mouth daily.   Yes Historical Provider, MD  HYDROcodone-acetaminophen (NORCO/VICODIN) 5-325  MG per tablet Take 1 tablet by mouth every 6 (six) hours as needed. pain 10/24/14  Yes Historical Provider, MD  acetaminophen (TYLENOL) 325 MG tablet Take 2 tablets (650 mg total) by mouth every 6 (six) hours as needed for mild pain or fever. Patient not taking: Reported on 03/03/2015 09/16/14   Freeman Caldron, PA-C  cephALEXin (KEFLEX) 500 MG capsule Take 1 capsule by mouth 3 (three) times daily. 10/25/14   Historical Provider, MD   BP 88/38 mmHg  Pulse 65  Temp(Src) 99.9 F (37.7 C) (Rectal)  Resp 30  Ht 5\' 2"  (1.575 m)  Wt 125 lb (56.7 kg)  BMI 22.86 kg/m2  SpO2 97% Physical Exam  Constitutional: She is oriented to person, place, and time. She appears well-developed. She appears cachectic. She appears distressed.  C/o of headache  HENT:  Head: Normocephalic and atraumatic.  Mouth/Throat: Oropharynx is clear and moist.  Eyes: Conjunctivae and EOM are normal. Pupils are equal, round, and reactive to light.  Neck: Normal range of motion. Neck supple.  Cardiovascular: Normal rate, regular rhythm and intact distal pulses.   No murmur heard. Pulmonary/Chest: Effort normal. Tachypnea noted. No respiratory distress. She has wheezes. She has rhonchi. She has no rales.  Abdominal: Soft. She exhibits no distension. There is tenderness in the suprapubic area. There is no rebound, no guarding and no CVA tenderness.  Musculoskeletal: Normal range of motion. She exhibits edema. She exhibits no tenderness.  1+ pitting edema bilateral feet  Neurological: She is alert and oriented to person, place, and time.  Skin: Skin is dry. No rash noted. No erythema.  Extremities are cool and hands and feet thready pulses palpated  Psychiatric: She has a normal mood and affect. Her behavior is normal.  Nursing note and vitals reviewed.   ED Course  Procedures (including critical care time) Labs Review Labs Reviewed  COMPREHENSIVE METABOLIC PANEL - Abnormal; Notable for the following:    BUN 25 (*)     Creatinine, Ser 1.79 (*)    Calcium 8.2 (*)    Total Protein 6.2 (*)    Albumin 3.4 (*)    AST 92 (*)    Total Bilirubin 1.4 (*)    GFR calc non Af Amer 25 (*)    GFR calc Af Amer 29 (*)    All other components within normal limits  CBC WITH DIFFERENTIAL/PLATELET - Abnormal; Notable for the following:    WBC 18.7 (*)    Platelets 112 (*)    Neutrophils Relative % 94 (*)    Neutro Abs 17.5 (*)    Lymphocytes Relative 2 (*)    Lymphs Abs 0.3 (*)    All other components within normal limits  URINALYSIS, ROUTINE W REFLEX MICROSCOPIC (NOT AT [facility]) - Abnormal; Notable for the following:    Color, Urine BROWN (*)    APPearance TURBID (*)    Hgb urine dipstick LARGE (*)    Bilirubin Urine MODERATE (*)    Protein, ur >300 (*)    Nitrite  POSITIVE (*)    Leukocytes, UA LARGE (*)    All other components within normal limits  BLOOD GAS, VENOUS - Abnormal; Notable for the following:    Acid-base deficit 5.4 (*)    All other components within normal limits  I-STAT CG4 LACTIC ACID, ED - Abnormal; Notable for the following:    Lactic Acid, Venous 2.44 (*)    All other components within normal limits  CULTURE, BLOOD (ROUTINE X 2)  CULTURE, BLOOD (ROUTINE X 2)  URINE CULTURE  URINE MICROSCOPIC-ADD ON  BLOOD GAS, ARTERIAL  I-STAT CG4 LACTIC ACID, ED    Imaging Review Dg Chest 2 View  03/03/2015   CLINICAL DATA:  Diarrhea for 2 days. Short of breath. Weakness and headache.  EXAM: CHEST  2 VIEW  COMPARISON:  None.  FINDINGS: Emphysema. Cardiomegaly. Aortic arch atherosclerosis. Pulmonary vascular congestion. LEFT subclavian 3 lead cardiac pacemaker. No focal consolidation. Monitoring leads project over the chest.  IMPRESSION: Emphysema and cardiomegaly with pulmonary vascular congestion.   Electronically Signed   By: Andreas Newport M.D.   On: 03/03/2015 18:39   Ct Head Wo Contrast  03/03/2015   CLINICAL DATA:  Weakness.  Headache.  EXAM: CT HEAD WITHOUT CONTRAST  TECHNIQUE: Contiguous axial  images were obtained from the base of the skull through the vertex without intravenous contrast.  COMPARISON:  None.  FINDINGS: Torticollis requires scanning in an oblique plane. This is similar to prior exams.  No mass lesion, mass effect, midline shift, hydrocephalus, hemorrhage. No acute territorial cortical ischemia/infarct. Atrophy and chronic ischemic white matter disease is present. Calvarium appears intact.  IMPRESSION: Atrophy and chronic ischemic white matter disease without acute intracranial abnormality. Nonstandard imaging plane due to torticollis.   Electronically Signed   By: Andreas Newport M.D.   On: 03/03/2015 20:20   I have personally reviewed and evaluated these images and lab results as part of my medical decision-making.   EKG Interpretation   Date/Time:  Friday March 03 2015 16:37:52 EDT Ventricular Rate:  75 PR Interval:    QRS Duration: 106 QT Interval:  426 QTC Calculation: 476 R Axis:   -86 Text Interpretation:   ventricular-paced rhythm No further analysis  attempted due to paced rhythm No significant change since last tracing  Confirmed by Anitra Lauth  MD, Alphonzo Lemmings ([id number]) on 03/03/2015 6:25:05 PM      MDM   Final diagnoses:  Septic shock  UTI (lower urinary tract infection)    Patient is an 81 year old female with a history of COPD on Symbicort but otherwise relatively healthy who presents today with 3 days of diarrhea and 12 hours of shortness of breath and altered mental status. Patient was at the beach with her family and they brought her back today because of her symptoms. On exam she is able to answer questions and is oriented but is complaining of shortness of breath and headache. Daughter states since her injury in February she has chronic headaches but has had multiple CTs all which were negative for acute pathology. No recent trauma and low suspicion that patient has new intracranial lesion. She has no neck pain or symptoms concerning for encephalitis or  meningitis.  Patient meets sepsis criteria and code sepsis order set initiated. Patient also given albuterol and Atrovent as she has bilateral wheezing and rhonchi. She was also given some morphine for her headache which she normally takes hydrocodone for this.  10:41 PM Labs indicate a new acute renal failure with a creatinine 1.7 as well  as a leukocytosis of 18,000 and UA consistent with a urinary tract infection. Despite 2-1/2 L of fluid patient continues to be hypotensive. Maps are in the 40s to 50s. Chest x-ray without signs of pneumonia, oxygen saturation remains stable. VBG without acute findings of respiratory acidosis. Lactic acid mildly elevated at 2.44. At this time spoke with daughter and patient and they're agreeable to a central line. Central line was placed in Levothroid was started. This improved patient's blood pressure to the low 100s with improved MAPs in the 60s.  Critical care notified and patient will be transferred to [facility] whether more dense available.  CENTRAL LINE Performed by: Gwyneth Sprout Consent: The procedure was performed in an emergent situation. Required items: required blood products, implants, devices, and special equipment available Patient identity confirmed: arm band and provided demographic data Time out: Immediately prior to procedure a "time out" was called to verify the correct patient, procedure, equipment, support staff and site/side marked as required. Indications: vascular access Anesthesia: local infiltration Local anesthetic: lidocaine 1% with epinephrine Anesthetic total: 3 ml Patient sedated: no Preparation: skin prepped with 2% chlorhexidine Skin prep agent dried: skin prep agent completely dried prior to procedure Sterile barriers: all five maximum sterile barriers used - cap, mask, sterile gown, sterile gloves, and large sterile sheet Hand hygiene: hand hygiene performed prior to central venous catheter insertion  Location  details: right IJ  Catheter type: triple lumen Catheter size: 8 Fr Pre-procedure: landmarks identified Ultrasound guidance:yes Successful placement: yes Post-procedure: line sutured and dressing applied Assessment: blood return through all parts, free fluid flow, placement verified by x-ray and no pneumothorax on x-ray Patient tolerance: Patient tolerated the procedure well with no immediate complications.   CRITICAL CARE Performed by: Gwyneth Sprout Total critical care time: 45 Critical care time was exclusive of separately billable procedures and treating other patients. Critical care was necessary to treat or prevent imminent or life-threatening deterioration. Critical care was time spent personally by me on the following activities: development of treatment plan with patient and/or surrogate as well as nursing, discussions with consultants, evaluation of patient's response to treatment, examination of patient, obtaining history from patient or surrogate, ordering and performing treatments and interventions, ordering and review of laboratory studies, ordering and review of radiographic studies, pulse oximetry and re-evaluation of patient's condition.   Gwyneth Sprout, MD 03/03/15 1610  Gwyneth Sprout, MD 03/04/15 [phone]

## 2015-03-03 NOTE — ED Notes (Signed)
Bed: ZO10 Expected date:  Expected time:  Means of arrival:  Comments: EMS- 81yo F, diarrhea, confused

## 2015-03-04 DIAGNOSIS — I5022 Chronic systolic (congestive) heart failure: Secondary | ICD-10-CM

## 2015-03-04 LAB — LACTIC ACID, PLASMA
LACTIC ACID, VENOUS: 1.1 mmol/L (ref 0.5–2.0)
Lactic Acid, Venous: 1.5 mmol/L (ref 0.5–2.0)
Lactic Acid, Venous: 1.2 mmol/L (ref 0.5–2.0)

## 2015-03-04 LAB — BLOOD GAS, ARTERIAL
Acid-base deficit: 7 mmol/L — ABNORMAL HIGH (ref 0.0–2.0)
BICARBONATE: 18.8 meq/L — AB (ref 20.0–24.0)
DRAWN BY: 405301
O2 Content: 2 L/min
O2 SAT: 97.7 %
Patient temperature: 98.6
TCO2: 20.1 mmol/L (ref 0–100)
pCO2 arterial: 43 mmHg (ref 35.0–45.0)
pH, Arterial: 7.263 — ABNORMAL LOW (ref 7.350–7.450)
pO2, Arterial: 107 mmHg — ABNORMAL HIGH (ref 80.0–100.0)

## 2015-03-04 LAB — CBC
HEMATOCRIT: 32.9 % — AB (ref 36.0–46.0)
Hemoglobin: 10.7 g/dL — ABNORMAL LOW (ref 12.0–15.0)
MCH: 29.7 pg (ref 26.0–34.0)
MCHC: 32.5 g/dL (ref 30.0–36.0)
MCV: 91.4 fL (ref 78.0–100.0)
PLATELETS: 168 10*3/uL (ref 150–400)
RBC: 3.6 MIL/uL — AB (ref 3.87–5.11)
RDW: 14.6 % (ref 11.5–15.5)
WBC: 38 10*3/uL — AB (ref 4.0–10.5)

## 2015-03-04 LAB — BASIC METABOLIC PANEL
ANION GAP: 7 (ref 5–15)
BUN: 22 mg/dL — ABNORMAL HIGH (ref 6–20)
CO2: 21 mmol/L — ABNORMAL LOW (ref 22–32)
Calcium: 6.7 mg/dL — ABNORMAL LOW (ref 8.9–10.3)
Chloride: 112 mmol/L — ABNORMAL HIGH (ref 101–111)
Creatinine, Ser: 1.69 mg/dL — ABNORMAL HIGH (ref 0.44–1.00)
GFR calc Af Amer: 31 mL/min — ABNORMAL LOW (ref >60)
GFR, EST NON AFRICAN AMERICAN: 27 mL/min — AB (ref >60)
GLUCOSE: 104 mg/dL — AB (ref 65–99)
POTASSIUM: 4.4 mmol/L (ref 3.5–5.1)
Sodium: 140 mmol/L (ref 135–145)

## 2015-03-04 LAB — URINALYSIS, ROUTINE W REFLEX MICROSCOPIC
Bilirubin Urine: NEGATIVE
Glucose, UA: NEGATIVE mg/dL
Ketones, ur: 15 mg/dL — AB
NITRITE: NEGATIVE
PH: 5.5 (ref 5.0–8.0)
Protein, ur: 100 mg/dL — AB
SPECIFIC GRAVITY, URINE: 1.017 (ref 1.005–1.030)
Urobilinogen, UA: 1 mg/dL (ref 0.0–1.0)

## 2015-03-04 LAB — PROTIME-INR
INR: 1.46 (ref 0.00–1.49)
Prothrombin Time: 17.8 seconds — ABNORMAL HIGH (ref 11.6–15.2)

## 2015-03-04 LAB — URINE MICROSCOPIC-ADD ON

## 2015-03-04 LAB — MRSA PCR SCREENING: MRSA by PCR: NEGATIVE

## 2015-03-04 LAB — MAGNESIUM: Magnesium: 1.6 mg/dL — ABNORMAL LOW (ref 1.7–2.4)

## 2015-03-04 LAB — PHOSPHORUS: PHOSPHORUS: 4.6 mg/dL (ref 2.5–4.6)

## 2015-03-04 MED ORDER — CETYLPYRIDINIUM CHLORIDE 0.05 % MT LIQD
7.0000 mL | Freq: Two times a day (BID) | OROMUCOSAL | Status: DC
  Administered 2015-03-04 – 2015-03-08 (×9): 7 mL via OROMUCOSAL

## 2015-03-04 MED ORDER — PIPERACILLIN-TAZOBACTAM 3.375 G IVPB: 3.3750 g | Freq: Three times a day (TID) | INTRAVENOUS | Status: DC

## 2015-03-04 MED ORDER — MAGNESIUM SULFATE 2 GM/50ML IV SOLN
2.0000 g | Freq: Once | INTRAVENOUS | Status: AC
  Administered 2015-03-04: 2 g via INTRAVENOUS
  Filled 2015-03-04: qty 50

## 2015-03-04 MED ORDER — CHLORHEXIDINE GLUCONATE 0.12 % MT SOLN
15.0000 mL | Freq: Two times a day (BID) | OROMUCOSAL | Status: DC
  Administered 2015-03-04 – 2015-03-07 (×7): 15 mL via OROMUCOSAL
  Filled 2015-03-04 (×5): qty 15

## 2015-03-04 MED ORDER — MORPHINE SULFATE (PF) 2 MG/ML IV SOLN
1.0000 mg | INTRAVENOUS | Status: DC | PRN
  Administered 2015-03-04 – 2015-03-09 (×7): 1 mg via INTRAVENOUS
  Filled 2015-03-04 (×8): qty 1

## 2015-03-04 MED ORDER — SODIUM CHLORIDE 0.9 % IV BOLUS (SEPSIS)
500.0000 mL | Freq: Once | INTRAVENOUS | Status: AC
  Administered 2015-03-04: 500 mL via INTRAVENOUS

## 2015-03-04 MED ORDER — VANCOMYCIN HCL 500 MG IV SOLR: 500.0000 mg | INTRAVENOUS | Status: DC

## 2015-03-04 NOTE — Progress Notes (Signed)
PULMONARY / CRITICAL CARE MEDICINE HISTORY AND PHYSICAL EXAMINATION   Name: [name] MRN: [id number] DOB: [birth year]    ADMISSION DATE:  03/03/2015  PRIMARY SERVICE: PCCM  CHIEF COMPLAINT:  Dyspnea  BRIEF PATIENT DESCRIPTION: 80 y/o woman with sepsis of urinary origin.  SIGNIFICANT EVENTS / STUDIES:  CT head 8/19 >chronic dz , no acute   LINES / TUBES: R IJ  03/03/15>>  CULTURES: Blood - 03/03/15>> Urine - 03/03/15>>  ANTIBIOTICS: Vanc: 8/19-- Pip-tazo: 8/19--  HISTORY OF PRESENT ILLNESS:  [name] is an 81 y/o woman with a history of COPD, tobacco abuse, AF, complete heart block s/p Bi-V ICD/pacemaker insertion who was brought to the ED with worsening dyspnea and confusion. The patient had been with her family down in [facility], but 24 hours prior to her presentation she began to complain of dyspnea. She had run out of her Symbicort, so her daughter brought her home a day early to provide the medication, but only minimal relief was provided. The patient also began to develop some mild confusion, and as her symptoms progressed, EMS was called. In the ED, the patient was found to have a low-grade fever, a marked leukocytosis, and hypotension. A U/A was suggestive of urinary tract infection. PCCM was called for admission.   SUBJECTIVE:  Has chronic headaches from neck injury in past on daily vicodin for pain.  Complains of headache   VITAL SIGNS: Temp:  [97.2 F (36.2 C)-99.9 F (37.7 C)] 97.2 F (36.2 C) (08/20 0800) Pulse Rate:  [65-85] 70 (08/20 1000) Resp:  [17-35] 18 (08/20 1000) BP: (70-138)/(0-99) 113/51 mmHg (08/20 1000) SpO2:  [87 %-100 %] 100 % (08/20 1000) Weight:  [119 lb 14.9 oz (54.4 kg)-125 lb (56.7 kg)] 121 lb 4.1 oz (55 kg) (08/20 0500) HEMODYNAMICS:   VENTILATOR SETTINGS:   INTAKE / OUTPUT: Intake/Output      08/19 0701 - 08/20 0700 08/20 0701 - 08/21 0700   I.V. (mL/kg) 1778.7 (32.3) 120 (2.2)   IV Piggyback 1000    Total Intake(mL/kg)  2778.7 (50.5) 120 (2.2)   Urine (mL/kg/hr) 240 10 (0.1)   Total Output 240 10   Net +2538.7 +110          PHYSICAL EXAMINATION: General:  Elderly woman in NAD Neuro:  Oriented to self, follows some commands, grimmancing  HEENT:  Dry MM Neck: No masses Cardiovascular:  Heart sounds dual and normal. Lungs:  Clear to auscultation Abdomen:  Soft Musculoskeletal:  No deformities seen. Skin:  Marked ecchymosis and thinning.  LABS:  CBC  Recent Labs Lab 03/03/15 1715 03/03/15 2320 03/04/15 0510  WBC 18.7* 34.2* 38.0*  HGB 12.0 10.7* 10.7*  HCT 37.8 33.3* 32.9*  PLT 112* 162 168   Coag's  Recent Labs Lab 03/04/15 0600  INR 1.46   BMET  Recent Labs Lab 03/03/15 1715 03/03/15 2320 03/04/15 0510  NA 140  --  140  K 4.2  --  4.4  CL 108  --  112*  CO2 23  --  21*  BUN 25*  --  22*  CREATININE 1.79* 1.60* 1.69*  GLUCOSE 87  --  104*   Electrolytes  Recent Labs Lab 03/03/15 1715 03/04/15 0510  CALCIUM 8.2* 6.7*  MG  --  1.6*  PHOS  --  4.6   Sepsis Markers  Recent Labs Lab 03/03/15 2245 03/04/15 0150 03/04/15 0713  LATICACIDVEN 1.56 1.5 1.2   ABG  Recent Labs Lab 03/04/15 0545  PHART 7.263*  PCO2ART  43.0  PO2ART 107*   Liver Enzymes  Recent Labs Lab 03/03/15 1715  AST 92*  ALT 42  ALKPHOS 109  BILITOT 1.4*  ALBUMIN 3.4*   Cardiac Enzymes No results for input(s): TROPONINI, PROBNP in the last 168 hours. Glucose No results for input(s): GLUCAP in the last 168 hours.  Imaging Dg Chest 1 View  03/03/2015   CLINICAL DATA:  Central line placement.  EXAM: CHEST  1 VIEW  COMPARISON:  Chest radiograph 03/03/2015  FINDINGS: Multi lead pacer apparatus overlies the left hemi thorax, leads are stable in position. Patient is rotated to the left. Stable cardiac and mediastinal contours. Coarse interstitial opacities bilaterally. Biapical pleural parenchymal thickening. New right IJ central venous catheter with tip projecting over the superior  vena cava. No significant pleural effusion or pneumothorax. Lateral right rib fractures.  IMPRESSION: New right IJ central venous catheter tip projects over the superior vena cava.  Cardiomegaly and mild interstitial pulmonary edema.  Multiple age-indeterminate right lower lateral rib fractures. Recommend correlation for point tenderness.   Electronically Signed   By: Annia Belt M.D.   On: 03/03/2015 23:03   Dg Chest 2 View  03/03/2015   CLINICAL DATA:  Diarrhea for 2 days. Short of breath. Weakness and headache.  EXAM: CHEST  2 VIEW  COMPARISON:  None.  FINDINGS: Emphysema. Cardiomegaly. Aortic arch atherosclerosis. Pulmonary vascular congestion. LEFT subclavian 3 lead cardiac pacemaker. No focal consolidation. Monitoring leads project over the chest.  IMPRESSION: Emphysema and cardiomegaly with pulmonary vascular congestion.   Electronically Signed   By: Andreas Newport M.D.   On: 03/03/2015 18:39   Ct Head Wo Contrast  03/03/2015   CLINICAL DATA:  Weakness.  Headache.  EXAM: CT HEAD WITHOUT CONTRAST  TECHNIQUE: Contiguous axial images were obtained from the base of the skull through the vertex without intravenous contrast.  COMPARISON:  None.  FINDINGS: Torticollis requires scanning in an oblique plane. This is similar to prior exams.  No mass lesion, mass effect, midline shift, hydrocephalus, hemorrhage. No acute territorial cortical ischemia/infarct. Atrophy and chronic ischemic white matter disease is present. Calvarium appears intact.  IMPRESSION: Atrophy and chronic ischemic white matter disease without acute intracranial abnormality. Nonstandard imaging plane due to torticollis.   Electronically Signed   By: Andreas Newport M.D.   On: 03/03/2015 20:20    EKG: Paced, CXR: Bi-V ICD in place. Increased vascular congestion seen.  ASSESSMENT / PLAN:  Active Problems:   Septic shock   PULMONARY A: COPD Interstitial pulmonary edema P:   Change  home symbicort to Brovana/Pulmicort   Albuterol As needed   Cautious volume management - try to maintain net even for now. O2 to keep sat >90%  CARDIOVASCULAR A: Septic Shock Afib CHF s/p Bi-V ICD P:   Levophed for MAP >65  Need to be cautious with volume. Maintain net even for now, consider diuresis once improved from a sepsis standpoint.  RENAL A: AKI Hypomagnesemia Lactic acidosis P:   Will monitor AKI for now, maintain net even as above. Replete magnesium, check mag in am  Lactic acidosis improving Check bmet at noon   GASTROINTESTINAL A: At risk for aspiration  P:   NPO except for pills  Advance diet if mentation improves   HEMATOLOGIC A: Severe leukocytosis P:   Likely due to sepsis, Trend to peak.  INFECTIOUS A: Septic Shock, most likely urosepsis w/ positive UA  P:   Treat with zosyn + vanc. F/u cultures.  ENDOCRINE A: No active  issues P:   Montior BS on bmet  Add SSI if BS >180   NEUROLOGIC A: Altered Mental Status Chronic pain/headaches on narcs at home  P:   Due to sepsis, will monitor for improvement. May use morphine As needed  , low dose to avoid oversedation    BEST PRACTICE / DISPOSITION Level of Care:  ICU Primary Service:  PCCM Consultants:  None Code Status:  Full Diet:  NPO DVT Px:  Heparin GI Px:  None Skin Integrity:  Intact Social / Family:  Daughter updated   TODAY'S SUMMARY: 64 y/o woman presenting with urosepsis. Dyspnea due to acidosis.

## 2015-03-04 NOTE — Progress Notes (Signed)
Utilization review completed.  

## 2015-03-04 NOTE — Progress Notes (Signed)
eLink Physician-Brief Progress Note Patient Name: TYAISHA CULLOM DOB: 1931/05/26 MRN: [id number]   Date of Service  03/04/2015  HPI/Events of Note  Reviewed VBG w/ acidosis. Patient awake & protecting airway on camera check. Mixed metabolic & respiratory acidosis.  eICU Interventions  Repeat VBG from central line at 0500.     Intervention Category Major Interventions: Acid-Base disturbance - evaluation and management  Lawanda Cousins 03/04/2015, 12:11 AM

## 2015-03-04 NOTE — H&P (Signed)
PULMONARY / CRITICAL CARE MEDICINE HISTORY AND PHYSICAL EXAMINATION   Name: [name] MRN: [id number] DOB: [birth year]    ADMISSION DATE:  03/03/2015  PRIMARY SERVICE: PCCM  CHIEF COMPLAINT:  Dyspnea  BRIEF PATIENT DESCRIPTION: 81 y/o woman with sepsis of urinary origin.  SIGNIFICANT EVENTS / STUDIES:  None  LINES / TUBES: R IJ placed in ED 03/03/15  CULTURES: Blood - 03/03/15 Urine - 03/03/15  ANTIBIOTICS: Vanc: 8/19-- Pip-tazo: 8/19--  HISTORY OF PRESENT ILLNESS:  [name] is an 81 y/o woman with a history of COPD, tobacco abuse, AF, complete heart block s/p Bi-V ICD/pacemaker insertion who was brought to the ED with worsening dyspnea and confusion. The patient had been with her family down in [facility], but 24 hours prior to her presentation she began to complain of dyspnea. She had run out of her Symbicort, so her daughter brought her home a day early to provide the medication, but only minimal relief was provided. The patient also began to develop some mild confusion, and as her symptoms progressed, EMS was called. In the ED, the patient was found to have a low-grade fever, a marked leukocytosis, and hypotension. A U/A was suggestive of urinary tract infection. PCCM was called for admission.   PAST MEDICAL HISTORY :  Past Medical History  Diagnosis Date  . Tobacco abuse   . Paroxysmal atrial fibrillation     AICD/pacemaker  . Pulmonary nodule   . COPD (chronic obstructive pulmonary disease)     Spirometry 06/10/05, FEV1 46% and ratio 51% with 14% better after albuterol   Past Surgical History  Procedure Laterality Date  . Pacemaker insertion    . Soft tissue tumor resection      From right forearm  . Skin graft    . Biv pacemaker generator change out N/A 07/28/2014    Procedure: BIV PACEMAKER GENERATOR CHANGE OUT;  Surgeon: Marinus Maw, MD;  Location[facility] CATH LAB;  Service: Cardiovascular;  Laterality: N/A;   Prior to Admission medications    Medication Sig Start Date End Date Taking? Authorizing Provider  budesonide-formoterol (SYMBICORT) 160-4.5 MCG/ACT inhaler Inhale 2 puffs into the lungs 2 (two) times daily.   Yes Historical Provider, MD  escitalopram (LEXAPRO) 20 MG tablet Take 20 mg by mouth daily.   Yes Historical Provider, MD  HYDROcodone-acetaminophen (NORCO/VICODIN) 5-325 MG per tablet Take 1 tablet by mouth every 6 (six) hours as needed. pain 10/24/14  Yes Historical Provider, MD  acetaminophen (TYLENOL) 325 MG tablet Take 2 tablets (650 mg total) by mouth every 6 (six) hours as needed for mild pain or fever. Patient not taking: Reported on 03/03/2015 09/16/14   Freeman Caldron, PA-C  cephALEXin (KEFLEX) 500 MG capsule Take 1 capsule by mouth 3 (three) times daily. 10/25/14   Historical Provider, MD   No Known Allergies  FAMILY HISTORY:  Family History  Problem Relation Age of Onset  . Dementia Mother   . Stroke Father 28  . Other Father 45    cerebral hemorrhage  . Leukemia Sister    SOCIAL HISTORY:  reports that she has been smoking Cigarettes.  She has a 25 pack-year smoking history. She has never used smokeless tobacco. She reports that she does not drink alcohol or use illicit drugs.  REVIEW OF SYSTEMS:  Unable to obtain 2/2 AMS  SUBJECTIVE:   VITAL SIGNS: Temp:  [98.6 F (37 C)-99.9 F (37.7 C)] 98.7 F (37.1 C) (08/20 0400) Pulse Rate:  [65-85] 70 (08/20 0600)  Resp:  [17-35] 20 (08/20 0600) BP: (70-138)/(0-99) 112/52 mmHg (08/20 0600) SpO2:  [87 %-100 %] 100 % (08/20 0600) Weight:  [119 lb 14.9 oz (54.4 kg)-125 lb (56.7 kg)] 121 lb 4.1 oz (55 kg) (08/20 0500) HEMODYNAMICS:   VENTILATOR SETTINGS:   INTAKE / OUTPUT: Intake/Output      08/19 0701 - 08/20 0700   I.V. (mL/kg) 1738.7 (31.6)   IV Piggyback 1000   Total Intake(mL/kg) 2738.7 (49.8)   Urine (mL/kg/hr) 240   Total Output 240   Net +2498.7         PHYSICAL EXAMINATION: General:  Elderly woman in NAD Neuro:  Somnolent, and  oriented to self only, but awake. HEENT:  Dry MM Neck: No masses Cardiovascular:  Heart sounds dual and normal. Lungs:  Clear to auscultation Abdomen:  Soft Musculoskeletal:  No deformities seen. Skin:  Marked ecchymosis and thinning.  LABS:  CBC  Recent Labs Lab 03/03/15 1715 03/03/15 2320 03/04/15 0510  WBC 18.7* 34.2* 38.0*  HGB 12.0 10.7* 10.7*  HCT 37.8 33.3* 32.9*  PLT 112* 162 168   Coag's  Recent Labs Lab 03/04/15 0600  INR 1.46   BMET  Recent Labs Lab 03/03/15 1715 03/03/15 2320 03/04/15 0510  NA 140  --  140  K 4.2  --  4.4  CL 108  --  112*  CO2 23  --  21*  BUN 25*  --  22*  CREATININE 1.79* 1.60* 1.69*  GLUCOSE 87  --  104*   Electrolytes  Recent Labs Lab 03/03/15 1715 03/04/15 0510  CALCIUM 8.2* 6.7*  MG  --  1.6*  PHOS  --  4.6   Sepsis Markers  Recent Labs Lab 03/03/15 1914 03/03/15 2245 03/04/15 0150  LATICACIDVEN 2.44* 1.56 1.5   ABG  Recent Labs Lab 03/04/15 0545  PHART 7.263*  PCO2ART 43.0  PO2ART 107*   Liver Enzymes  Recent Labs Lab 03/03/15 1715  AST 92*  ALT 42  ALKPHOS 109  BILITOT 1.4*  ALBUMIN 3.4*   Cardiac Enzymes No results for input(s): TROPONINI, PROBNP in the last 168 hours. Glucose No results for input(s): GLUCAP in the last 168 hours.  Imaging Dg Chest 1 View  03/03/2015   CLINICAL DATA:  Central line placement.  EXAM: CHEST  1 VIEW  COMPARISON:  Chest radiograph 03/03/2015  FINDINGS: Multi lead pacer apparatus overlies the left hemi thorax, leads are stable in position. Patient is rotated to the left. Stable cardiac and mediastinal contours. Coarse interstitial opacities bilaterally. Biapical pleural parenchymal thickening. New right IJ central venous catheter with tip projecting over the superior vena cava. No significant pleural effusion or pneumothorax. Lateral right rib fractures.  IMPRESSION: New right IJ central venous catheter tip projects over the superior vena cava.  Cardiomegaly  and mild interstitial pulmonary edema.  Multiple age-indeterminate right lower lateral rib fractures. Recommend correlation for point tenderness.   Electronically Signed   By: Annia Belt M.D.   On: 03/03/2015 23:03   Dg Chest 2 View  03/03/2015   CLINICAL DATA:  Diarrhea for 2 days. Short of breath. Weakness and headache.  EXAM: CHEST  2 VIEW  COMPARISON:  None.  FINDINGS: Emphysema. Cardiomegaly. Aortic arch atherosclerosis. Pulmonary vascular congestion. LEFT subclavian 3 lead cardiac pacemaker. No focal consolidation. Monitoring leads project over the chest.  IMPRESSION: Emphysema and cardiomegaly with pulmonary vascular congestion.   Electronically Signed   By: Andreas Newport M.D.   On: 03/03/2015 18:39   Ct Head Wo  Contrast  03/03/2015   CLINICAL DATA:  Weakness.  Headache.  EXAM: CT HEAD WITHOUT CONTRAST  TECHNIQUE: Contiguous axial images were obtained from the base of the skull through the vertex without intravenous contrast.  COMPARISON:  None.  FINDINGS: Torticollis requires scanning in an oblique plane. This is similar to prior exams.  No mass lesion, mass effect, midline shift, hydrocephalus, hemorrhage. No acute territorial cortical ischemia/infarct. Atrophy and chronic ischemic white matter disease is present. Calvarium appears intact.  IMPRESSION: Atrophy and chronic ischemic white matter disease without acute intracranial abnormality. Nonstandard imaging plane due to torticollis.   Electronically Signed   By: Andreas Newport M.D.   On: 03/03/2015 20:20    EKG: Paced, CXR: Bi-V ICD in place. Increased vascular congestion seen.  ASSESSMENT / PLAN:  Active Problems:   Septic shock   PULMONARY A: COPD Interstitial pulmonary edema P:   Can continue home symbicort Cautious volume management - try to maintain net even for now.  CARDIOVASCULAR A: Septic Shock Afib CHF s/p Bi-V ICD P:   Maintaining MAPs with levophed. Need to be cautious with volume. Maintain net even for  now, consider diuresis once improved from a sepsis standpoint.  RENAL A: AKI Hypomagnesemia Lactic acidosis P:   Will monitor AKI for now, maintain net even as above. Replete magnesium Lactic acidosis improving  GASTROINTESTINAL A: No active issues P:   NPO for now, can consider advancing feeds if doing better in the morning.  HEMATOLOGIC A: Severe leukocytosis P:   Likely due to sepsis, Trend to peak.  INFECTIOUS A: Septic Shock, favor urinary origin P:   Treat with zosyn + vanc. F/u cultures.  ENDOCRINE A: No active issues P:    NEUROLOGIC A: Altered Mental Status P:   Due to sepsis, will monitor for improvement.  BEST PRACTICE / DISPOSITION Level of Care:  ICU Primary Service:  PCCM Consultants:  None Code Status:  Full Diet:  NPO DVT Px:  Heparin GI Px:  None Skin Integrity:  Intact Social / Family:  At bedside on admission  TODAY'S SUMMARY: 81 y/o woman presenting with urosepsis. Dyspnea due to acidosis.  I have personally obtained a history, examined the patient, evaluated laboratory and imaging results, formulated the assessment and plan and placed orders.  CRITICAL CARE: The patient is critically ill with multiple organ systems failure and requires high complexity decision making for assessment and support, frequent evaluation and titration of therapies, application of advanced monitoring technologies and extensive interpretation of multiple databases. Critical Care Time devoted to patient care services described in this note is 65 minutes.   Jamie Kato, MD Pulmonary and Critical Care Medicine [facility] Pager: [phone]   03/04/2015, 6:57 AM

## 2015-03-05 LAB — BASIC METABOLIC PANEL
ANION GAP: 8 (ref 5–15)
BUN: 31 mg/dL — ABNORMAL HIGH (ref 6–20)
CALCIUM: 7.5 mg/dL — AB (ref 8.9–10.3)
CO2: 21 mmol/L — AB (ref 22–32)
Chloride: 111 mmol/L (ref 101–111)
Creatinine, Ser: 1.78 mg/dL — ABNORMAL HIGH (ref 0.44–1.00)
GFR, EST AFRICAN AMERICAN: 29 mL/min — AB (ref >60)
GFR, EST NON AFRICAN AMERICAN: 25 mL/min — AB (ref >60)
Glucose, Bld: 73 mg/dL (ref 65–99)
Potassium: 3.9 mmol/L (ref 3.5–5.1)
Sodium: 140 mmol/L (ref 135–145)

## 2015-03-05 LAB — CBC WITH DIFFERENTIAL/PLATELET
BASOS ABS: 0 10*3/uL (ref 0.0–0.1)
Basophils Relative: 0 % (ref 0–1)
EOS PCT: 0 % (ref 0–5)
Eosinophils Absolute: 0 10*3/uL (ref 0.0–0.7)
HCT: 29.7 % — ABNORMAL LOW (ref 36.0–46.0)
HEMOGLOBIN: 9.5 g/dL — AB (ref 12.0–15.0)
LYMPHS PCT: 3 % — AB (ref 12–46)
Lymphs Abs: 0.5 10*3/uL — ABNORMAL LOW (ref 0.7–4.0)
MCH: 29 pg (ref 26.0–34.0)
MCHC: 32 g/dL (ref 30.0–36.0)
MCV: 90.5 fL (ref 78.0–100.0)
Monocytes Absolute: 0.8 10*3/uL (ref 0.1–1.0)
Monocytes Relative: 5 % (ref 3–12)
NEUTROS ABS: 14.7 10*3/uL — AB (ref 1.7–7.7)
NEUTROS PCT: 92 % — AB (ref 43–77)
PLATELETS: 116 10*3/uL — AB (ref 150–400)
RBC: 3.28 MIL/uL — AB (ref 3.87–5.11)
RDW: 14.4 % (ref 11.5–15.5)
WBC: 16 10*3/uL — AB (ref 4.0–10.5)

## 2015-03-05 LAB — MAGNESIUM: MAGNESIUM: 2.2 mg/dL (ref 1.7–2.4)

## 2015-03-05 MED ORDER — DEXTROSE-NACL 5-0.45 % IV SOLN
INTRAVENOUS | Status: DC
  Administered 2015-03-05 – 2015-03-08 (×3): via INTRAVENOUS

## 2015-03-05 MED ORDER — PIPERACILLIN-TAZOBACTAM IN DEX 2-0.25 GM/50ML IV SOLN
2.2500 g | Freq: Three times a day (TID) | INTRAVENOUS | Status: DC
  Administered 2015-03-05 – 2015-03-06 (×3): 2.25 g via INTRAVENOUS
  Filled 2015-03-05 (×5): qty 50

## 2015-03-05 MED ORDER — VANCOMYCIN HCL IN DEXTROSE 750-5 MG/150ML-% IV SOLN: 750.0000 mg | INTRAVENOUS | Status: DC

## 2015-03-05 MED ORDER — ARFORMOTEROL TARTRATE 15 MCG/2ML IN NEBU
15.0000 ug | INHALATION_SOLUTION | Freq: Two times a day (BID) | RESPIRATORY_TRACT | Status: DC
  Administered 2015-03-05 – 2015-03-10 (×9): 15 ug via RESPIRATORY_TRACT
  Filled 2015-03-05 (×13): qty 2

## 2015-03-05 MED ORDER — BUDESONIDE 0.25 MG/2ML IN SUSP
0.2500 mg | Freq: Two times a day (BID) | RESPIRATORY_TRACT | Status: DC
  Administered 2015-03-05 – 2015-03-10 (×9): 0.25 mg via RESPIRATORY_TRACT
  Filled 2015-03-05 (×11): qty 2

## 2015-03-05 NOTE — Progress Notes (Signed)
PULMONARY / CRITICAL CARE MEDICINE HISTORY AND PHYSICAL EXAMINATION   Name: [name] MRN: [id number] DOB: Aug 31, 1930    ADMISSION DATE:  03/03/2015  PRIMARY SERVICE: PCCM  CHIEF COMPLAINT:  Dyspnea  BRIEF PATIENT DESCRIPTION: 81 y/o woman with sepsis of urinary origin.  SIGNIFICANT EVENTS / STUDIES:  CT head 8/19 >chronic dz , no acute   LINES / TUBES: R IJ  03/03/15>>  CULTURES: Blood - 03/03/15>> Urine - 03/03/15>>  ANTIBIOTICS: Vanc: 8/19-- Pip-tazo: 8/19--  HISTORY OF PRESENT ILLNESS:  [name] is an 81 y/o woman with a history of COPD, tobacco abuse, AF, complete heart block s/p Bi-V ICD/pacemaker insertion who was brought to the ED with worsening dyspnea and confusion. The patient had been with her family down in [facility], but 24 hours prior to her presentation she began to complain of dyspnea. She had run out of her Symbicort, so her daughter brought her home a day early to provide the medication, but only minimal relief was provided. The patient also began to develop some mild confusion, and as her symptoms progressed, EMS was called. In the ED, the patient was found to have a low-grade fever, a marked leukocytosis, and hypotension. A U/A was suggestive of urinary tract infection. PCCM was called for admission.   SUBJECTIVE:  Resting this am, no complaints of HA overnight, slept well . On low dose pressors   VITAL SIGNS: Temp:  [97.5 F (36.4 C)-98.3 F (36.8 C)] 98.3 F (36.8 C) (08/21 0805) Pulse Rate:  [69-70] 69 (08/21 0800) Resp:  [14-32] 17 (08/21 0800) BP: (92-124)/(34-67) 94/44 mmHg (08/21 0800) SpO2:  [95 %-100 %] 100 % (08/21 0800) Weight:  [124 lb 9 oz (56.5 kg)] 124 lb 9 oz (56.5 kg) (08/21 0447) HEMODYNAMICS:   VENTILATOR SETTINGS:   INTAKE / OUTPUT: Intake/Output      08/20 0701 - 08/21 0700 08/21 0701 - 08/22 0700   I.V. (mL/kg) 725.6 (12.8) 18.5 (0.3)   IV Piggyback 687.5 50   Total Intake(mL/kg) 1413.1 (25) 68.5 (1.2)   Urine  (mL/kg/hr) 450 (0.3) 100 (0.7)   Total Output 450 100   Net +963.1 -31.5          PHYSICAL EXAMINATION: General:  Elderly woman in NAD Neuro: sleeping  HEENT:  Dry MM Neck: No masses Cardiovascular:  Heart sounds dual and normal. Lungs:  Clear to auscultation Abdomen:  Soft Musculoskeletal:  No deformities seen. Skin:  Marked ecchymosis and thinning.  LABS:  CBC  Recent Labs Lab 03/03/15 2320 03/04/15 0510 03/05/15 0513  WBC 34.2* 38.0* 16.0*  HGB 10.7* 10.7* 9.5*  HCT 33.3* 32.9* 29.7*  PLT 162 168 116*   Coag's  Recent Labs Lab 03/04/15 0600  INR 1.46   BMET  Recent Labs Lab 03/03/15 1715 03/03/15 2320 03/04/15 0510 03/05/15 0513  NA 140  --  140 140  K 4.2  --  4.4 3.9  CL 108  --  112* 111  CO2 23  --  21* 21*  BUN 25*  --  22* 31*  CREATININE 1.79* 1.60* 1.69* 1.78*  GLUCOSE 87  --  104* 73   Electrolytes  Recent Labs Lab 03/03/15 1715 03/04/15 0510 03/05/15 0513  CALCIUM 8.2* 6.7* 7.5*  MG  --  1.6* 2.2  PHOS  --  4.6  --    Sepsis Markers  Recent Labs Lab 03/04/15 0150 03/04/15 0713 03/04/15 1230  LATICACIDVEN 1.5 1.2 1.1   ABG  Recent Labs Lab 03/04/15 0545  PHART 7.263*  PCO2ART 43.0  PO2ART 107*   Liver Enzymes  Recent Labs Lab 03/03/15 1715  AST 92*  ALT 42  ALKPHOS 109  BILITOT 1.4*  ALBUMIN 3.4*   Cardiac Enzymes No results for input(s): TROPONINI, PROBNP in the last 168 hours. Glucose No results for input(s): GLUCAP in the last 168 hours.  Imaging Dg Chest 1 View  03/03/2015   CLINICAL DATA:  Central line placement.  EXAM: CHEST  1 VIEW  COMPARISON:  Chest radiograph 03/03/2015  FINDINGS: Multi lead pacer apparatus overlies the left hemi thorax, leads are stable in position. Patient is rotated to the left. Stable cardiac and mediastinal contours. Coarse interstitial opacities bilaterally. Biapical pleural parenchymal thickening. New right IJ central venous catheter with tip projecting over the superior  vena cava. No significant pleural effusion or pneumothorax. Lateral right rib fractures.  IMPRESSION: New right IJ central venous catheter tip projects over the superior vena cava.  Cardiomegaly and mild interstitial pulmonary edema.  Multiple age-indeterminate right lower lateral rib fractures. Recommend correlation for point tenderness.   Electronically Signed   By: Annia Belt M.D.   On: 03/03/2015 23:03   Dg Chest 2 View  03/03/2015   CLINICAL DATA:  Diarrhea for 2 days. Short of breath. Weakness and headache.  EXAM: CHEST  2 VIEW  COMPARISON:  None.  FINDINGS: Emphysema. Cardiomegaly. Aortic arch atherosclerosis. Pulmonary vascular congestion. LEFT subclavian 3 lead cardiac pacemaker. No focal consolidation. Monitoring leads project over the chest.  IMPRESSION: Emphysema and cardiomegaly with pulmonary vascular congestion.   Electronically Signed   By: Andreas Newport M.D.   On: 03/03/2015 18:39   Ct Head Wo Contrast  03/03/2015   CLINICAL DATA:  Weakness.  Headache.  EXAM: CT HEAD WITHOUT CONTRAST  TECHNIQUE: Contiguous axial images were obtained from the base of the skull through the vertex without intravenous contrast.  COMPARISON:  None.  FINDINGS: Torticollis requires scanning in an oblique plane. This is similar to prior exams.  No mass lesion, mass effect, midline shift, hydrocephalus, hemorrhage. No acute territorial cortical ischemia/infarct. Atrophy and chronic ischemic white matter disease is present. Calvarium appears intact.  IMPRESSION: Atrophy and chronic ischemic white matter disease without acute intracranial abnormality. Nonstandard imaging plane due to torticollis.   Electronically Signed   By: Andreas Newport M.D.   On: 03/03/2015 20:20    EKG: Paced, CXR: Bi-V ICD in place. Increased vascular congestion seen.  ASSESSMENT / PLAN:  Active Problems:   Septic shock   PULMONARY A: COPD Interstitial pulmonary edema Right Rib fractures ? Old  P:   Change  home symbicort to  Brovana/Pulmicort  Albuterol As needed   Cautious volume management - try to maintain net even for now. O2 to keep sat >90% Check cxr in am   CARDIOVASCULAR A: Septic Shock remains on pressors  Afib CHF s/p Bi-V ICD P:   Levophed for MAP >65, wean as able  Need to be cautious with volume. Maintain net even for now, consider diuresis once improved from a sepsis standpoint.  RENAL A: AKI Hypomagnesemia>resolved  Lactic acidosis-improving  P:   Will monitor bmet       GASTROINTESTINAL A: At risk for aspiration  P:   NPO except for pills  Change IVF D51/2 NS KVO   HEMATOLOGIC A: Severe leukocytosis>improving  Anemia  P:    Follow cbc  Hep for DVT prophylaxis   INFECTIOUS A: Septic Shock, most likely urosepsis w/ positive UA  P:  Treat with zosyn + vanc. F/u cultures.  ENDOCRINE A: No active issues P:   Montior BS on bmet  Add SSI if BS >180   NEUROLOGIC A: Altered Mental Status Chronic pain/headaches on narcs at home  P:   Due to sepsis, will monitor for improvement. May use morphine As needed  , low dose to avoid oversedation    BEST PRACTICE / DISPOSITION Level of Care:  ICU Primary Service:  PCCM Consultants:  None Code Status:  Full Diet:  NPO DVT Px:  Heparin GI Px:  None Skin Integrity:  Intact Social / Family:  Daughter updated   TODAY'S SUMMARY: 81 y/o woman presenting with urosepsis. WBC tr down  Remains on low dose pressors .    Tammy Parrett NP-C  [facility]  [phone]

## 2015-03-05 NOTE — Progress Notes (Signed)
ANTIBIOTIC CONSULT NOTE  Pharmacy Consult for Vancomycin & Zosyn Indication: urosepsis  No Known Allergies  Labs:  Recent Labs  03/03/15 2320 03/04/15 0510 03/05/15 0513  WBC 34.2* 38.0* 16.0*  HGB 10.7* 10.7* 9.5*  PLT 162 168 116*  CREATININE 1.60* 1.69* 1.78*   Estimated Creatinine Clearance: 19.3 mL/min (by C-G formula based on Cr of 1.78). No results for input(s): VANCOTROUGH, VANCOPEAK, VANCORANDOM, GENTTROUGH, GENTPEAK, GENTRANDOM, TOBRATROUGH, TOBRAPEAK, TOBRARND, AMIKACINPEAK, AMIKACINTROU, AMIKACIN in the last 72 hours.   Microbiology: Recent Results (from the past 720 hour(s))  Culture, blood (routine x 2)     Status: None (Preliminary result)   Collection Time: 03/03/15  4:21 PM  Result Value Ref Range Status   Specimen Description BLOOD LEFT ANTECUBITAL  Final   Special Requests BOTTLES DRAWN AEROBIC ONLY  Final   Culture   Final    NO GROWTH < 24 HOURS Performed at [facility]    Report Status PENDING  Incomplete  MRSA PCR Screening     Status: None   Collection Time: 03/04/15  1:36 AM  Result Value Ref Range Status   MRSA by PCR NEGATIVE NEGATIVE Final    Comment:        The GeneXpert MRSA Assay (FDA approved for NASAL specimens only), is one component of a comprehensive MRSA colonization surveillance program. It is not intended to diagnose MRSA infection nor to guide or monitor treatment for MRSA infections.     Anti-infectives    Start     Dose/Rate Route Frequency Ordered Stop   03/05/15 0600  vancomycin (VANCOCIN) 500 mg in sodium chloride 0.9 % 100 mL IVPB  Status:  Discontinued     500 mg 100 mL/hr over 60 Minutes Intravenous Every 24 hours 03/04/15 2040 03/04/15 2042   03/04/15 2200  piperacillin-tazobactam (ZOSYN) IVPB 3.375 g  Status:  Discontinued     3.375 g 12.5 mL/hr over 240 Minutes Intravenous 3 times per day 03/04/15 2040 03/04/15 2042   03/04/15 2000  vancomycin (VANCOCIN) 500 mg in sodium chloride 0.9 % 100 mL  IVPB     500 mg 100 mL/hr over 60 Minutes Intravenous Every 24 hours 03/03/15 2338     03/04/15 0600  piperacillin-tazobactam (ZOSYN) IVPB 3.375 g     3.375 g 12.5 mL/hr over 240 Minutes Intravenous 3 times per day 03/03/15 2338     03/03/15 1900  piperacillin-tazobactam (ZOSYN) IVPB 3.375 g     3.375 g 100 mL/hr over 30 Minutes Intravenous  Once 03/03/15 1854 03/03/15 1953   03/03/15 1900  vancomycin (VANCOCIN) IVPB 1000 mg/200 mL premix     1,000 mg 200 mL/hr over 60 Minutes Intravenous  Once 03/03/15 1854 03/03/15 2119      Assessment: 82 yo F who presented with altered mental status, diarrhea, and shortness of breath.  Upon admission, she was hypotensive and tachypneic with leukocytosis however no fevers.  Empiric, broad-spectrum antibiotics initiated for sepsis August 19  Scr continues to trend up --> 1.78 today, continues on low dose pressors    8/19: CXR + emphysema and pulmonary vascular congestion.    8/19: Blood x2: IP 8/19: Urine: pending  Goal of Therapy:  Vancomycin trough level 15-20 mcg/ml  Plan:  Change Zosyn to 2.25 grams iv Q 8 hours Change Vancomycin to 750 mg iv Q 48 hours Continue to monitor renal function Follow up cultures  Thank you. Okey Regal, PharmD [phone]  03/05/2015,12:13 PM

## 2015-03-06 ENCOUNTER — Inpatient Hospital Stay (HOSPITAL_COMMUNITY): Payer: Medicare Other

## 2015-03-06 LAB — BASIC METABOLIC PANEL
Anion gap: 6 (ref 5–15)
BUN: 30 mg/dL — AB (ref 6–20)
CHLORIDE: 112 mmol/L — AB (ref 101–111)
CO2: 22 mmol/L (ref 22–32)
Calcium: 7.7 mg/dL — ABNORMAL LOW (ref 8.9–10.3)
Creatinine, Ser: 1.54 mg/dL — ABNORMAL HIGH (ref 0.44–1.00)
GFR calc Af Amer: 35 mL/min — ABNORMAL LOW (ref >60)
GFR calc non Af Amer: 30 mL/min — ABNORMAL LOW (ref >60)
GLUCOSE: 94 mg/dL (ref 65–99)
POTASSIUM: 3.5 mmol/L (ref 3.5–5.1)
Sodium: 140 mmol/L (ref 135–145)

## 2015-03-06 LAB — URINE CULTURE: Culture: >=100000

## 2015-03-06 LAB — CBC WITH DIFFERENTIAL/PLATELET
Basophils Absolute: 0 10*3/uL (ref 0.0–0.1)
Basophils Relative: 0 % (ref 0–1)
EOS PCT: 0 % (ref 0–5)
Eosinophils Absolute: 0.1 10*3/uL (ref 0.0–0.7)
HEMATOCRIT: 30.1 % — AB (ref 36.0–46.0)
HEMOGLOBIN: 9.6 g/dL — AB (ref 12.0–15.0)
LYMPHS ABS: 0.9 10*3/uL (ref 0.7–4.0)
LYMPHS PCT: 6 % — AB (ref 12–46)
MCH: 28.4 pg (ref 26.0–34.0)
MCHC: 31.9 g/dL (ref 30.0–36.0)
MCV: 89.1 fL (ref 78.0–100.0)
Monocytes Absolute: 0.5 10*3/uL (ref 0.1–1.0)
Monocytes Relative: 3 % (ref 3–12)
NEUTROS PCT: 91 % — AB (ref 43–77)
Neutro Abs: 13.6 10*3/uL — ABNORMAL HIGH (ref 1.7–7.7)
Platelets: 122 10*3/uL — ABNORMAL LOW (ref 150–400)
RBC: 3.38 MIL/uL — AB (ref 3.87–5.11)
RDW: 14.3 % (ref 11.5–15.5)
WBC: 15 10*3/uL — AB (ref 4.0–10.5)

## 2015-03-06 MED ORDER — CEFTRIAXONE SODIUM 1 G IJ SOLR
1.0000 g | INTRAMUSCULAR | Status: DC
  Administered 2015-03-06 – 2015-03-07 (×2): 1 g via INTRAVENOUS
  Filled 2015-03-06 (×3): qty 10

## 2015-03-06 MED ORDER — FENTANYL CITRATE (PF) 100 MCG/2ML IJ SOLN: 12.5000 ug | INTRAMUSCULAR | Status: DC | PRN

## 2015-03-06 NOTE — Progress Notes (Signed)
PULMONARY / CRITICAL CARE MEDICINE HISTORY AND PHYSICAL EXAMINATION   Name: [name] MRN: [id number] DOB: 1930-12-23    ADMISSION DATE:  03/03/2015  PRIMARY SERVICE: PCCM  CHIEF COMPLAINT:  Dyspnea  BRIEF PATIENT DESCRIPTION: [name] is an 81 y/o woman with a history of COPD, tobacco abuse, AF, complete heart block s/p Bi-V ICD/pacemaker insertion who was brought to the ED with worsening dyspnea and confusion. The patient had been with her family down in [facility], but 24 hours prior to her presentation she began to complain of dyspnea. She had run out of her Symbicort, so her daughter brought her home a day early to provide the medication, but only minimal relief was provided. The patient also began to develop some mild confusion, and as her symptoms progressed, EMS was called. In the ED, the patient was found to have a low-grade fever, a marked leukocytosis, and hypotension. A U/A was suggestive of urinary tract infection. PCCM was called for admission.      SIGNIFICANT EVENTS / STUDIES:  CT head 8/19 >chronic dz , no acute   LINES / TUBES: R IJ  03/03/15>>  CULTURES: Blood - 03/03/15>> Urine - 03/03/15>>pan sensitie e colii   ANTIBIOTICS: Vanc: 8/19--03/06/2015  Pip-tazo: 8/19--03/06/2015  Ceftriaxone (e Colii UTI) >>     MSUBJECTIVE: 03/05/15: Resting this am, no complaints of HA overnight, slept well . On low dose pressors    SUBJECTIVE/OVERNIGHT/INTERVAL HX 03/06/2015 : hypoactive delirum + per RN - during wakefulness "I want to go see my mom with Jesus . Do not hurt my daughter by keeping me here. Why is God keeping me here when he knows I need to be there." . RN reports patient has beenpraying for death. . OFf pressors. RN feels lower MAP goal is better. . No overnight problems. AKi better. Cuture with pan sensitie e colii  Later when family arrived she started screaming "They are hurting me, they are giving me shots and pills but main thing is they are  giving me 3 shots"  - refers to heparin.   Daughter and son in law admiot to dysphagia and ECOG 4 and round the clock care since fall Feb 2016.    VITAL SIGNS: Temp:  [97.4 F (36.3 C)-99.1 F (37.3 C)] 97.5 F (36.4 C) (08/22 0810) Pulse Rate:  [68-73] 70 (08/22 1100) Resp:  [14-26] 17 (08/22 1100) BP: (95-145)/(42-72) 105/53 mmHg (08/22 1100) SpO2:  [97 %-100 %] 100 % (08/22 1100) Weight:  [56.3 kg (124 lb 1.9 oz)] 56.3 kg (124 lb 1.9 oz) (08/22 0431) HEMODYNAMICS:   VENTILATOR SETTINGS:   INTAKE / OUTPUT: Intake/Output      08/21 0701 - 08/22 0700 08/22 0701 - 08/23 0700   I.V. (mL/kg) 714.4 (12.7) 101.3 (1.8)   IV Piggyback 200    Total Intake(mL/kg) 914.4 (16.2) 101.3 (1.8)   Urine (mL/kg/hr) 985 (0.7) 185 (0.8)   Total Output 985 185   Net -70.6 -83.7          PHYSICAL EXAMINATION: General:  Elderly woman in NAD Neuro: RASS -1, Some confused but is aware of being in hospital and MD presence "Doctor do not hurt me please" "I want to be with god" HEENT:  Dry MM. NECK IN RIGHTWARD POSITION - BASELINE Neck: No masses Cardiovascular:  Heart sounds dual and normal. Lungs:  Clear to auscultation Abdomen:  Soft Musculoskeletal:  No deformities seen. Skin:  Marked ecchymosis and thinning.  LABS:  PULMONARY  Recent Labs Lab 03/03/15  1810 03/03/15 2248 03/04/15 0545  PHART  --   --  7.263*  PCO2ART  --   --  43.0  PO2ART  --   --  107*  HCO3 20.8 18.6* 18.8*  TCO2 19.9 18.3 20.1  O2SAT 47.8 78.4 97.7    CBC  Recent Labs Lab 03/04/15 0510 03/05/15 0513 03/06/15 0417  HGB 10.7* 9.5* 9.6*  HCT 32.9* 29.7* 30.1*  WBC 38.0* 16.0* 15.0*  PLT 168 116* 122*    COAGULATION  Recent Labs Lab 03/04/15 0600  INR 1.46    CARDIAC  No results for input(s): TROPONINI in the last 168 hours. No results for input(s): PROBNP in the last 168 hours.   CHEMISTRY  Recent Labs Lab 03/03/15 1715 03/03/15 2320 03/04/15 0510 03/05/15 0513 03/06/15 0417  NA  140  --  140 140 140  K 4.2  --  4.4 3.9 3.5  CL 108  --  112* 111 112*  CO2 23  --  21* 21* 22  GLUCOSE 87  --  104* 73 94  BUN 25*  --  22* 31* 30*  CREATININE 1.79* 1.60* 1.69* 1.78* 1.54*  CALCIUM 8.2*  --  6.7* 7.5* 7.7*  MG  --   --  1.6* 2.2  --   PHOS  --   --  4.6  --   --    Estimated Creatinine Clearance: 22.3 mL/min (by C-G formula based on Cr of 1.54).   LIVER  Recent Labs Lab 03/03/15 1715 03/04/15 0600  AST 92*  --   ALT 42  --   ALKPHOS 109  --   BILITOT 1.4*  --   PROT 6.2*  --   ALBUMIN 3.4*  --   INR  --  1.46     INFECTIOUS  Recent Labs Lab 03/04/15 0150 03/04/15 0713 03/04/15 1230  LATICACIDVEN 1.5 1.2 1.1     ENDOCRINE CBG (last 3)  No results for input(s): GLUCAP in the last 72 hours.       IMAGING x48h  - image(s) personally visualized  -   highlighted in bold Dg Chest Port 1 View  03/06/2015   CLINICAL DATA:  Shortness of breath.  Pulmonary edema.  EXAM: PORTABLE CHEST - 1 VIEW  COMPARISON:  03/03/2015 .  FINDINGS: Right IJ line in stable position. Cardiac pacer in stable position. Cardiomegaly with mild pulmonary vascular prominence. Bilateral pulmonary alveolar infiltrates and pleural effusions. Findings consistent congestive heart failure.  IMPRESSION: 1. Right IJ line in stable position. 2. Cardiac pacer in stable position. Cardiomegaly with bilateral pulmonary alveolar infiltrates and pleural effusions consistent with congestive heart failure. Findings have progressed from prior exam .   Electronically Signed   By: Maisie Fus  Register   On: 03/06/2015 07:12        ASSESSMENT / PLAN:  Active Problems:   Septic shock   PULMONARY A: COPD Interstitial pulmonary edema Right Rib fractures ? Old   - no distress  P:   Change  home symbicort to Brovana/Pulmicort  Albuterol As needed   Cautious volume management - try to maintain net even for now. O2 to keep sat >90%   CARDIOVASCULAR A: Septic Shock remains on pressors   Afib CHF s/p Bi-V ICD   - off pressors  P:   Change MAP goal  > 55, SBP > 90 Need to be cautious with volume. Maintain net even for now, consider diuresis once improved from a sepsis standpoint.  RENAL A: AKI Hypomagnesemia>resolved  Lactic acidosis-improving    - AKI improved  P:   Will monitor bmet       GASTROINTESTINAL A: At risk for aspiration  Family reports chronic dysphagia  P:   NPO except for pills   IVF D51/2 NS KVO  Speeh eval - might need PEG prior to dc  HEMATOLOGIC A: Severe leukocytosis>improving  Anemia   - unchanged 03/06/2015   P:    Follow cbc  Hep for DVT prophylaxis   INFECTIOUS A: Septic Shock, most likely urosepsis w/ positive UA    - culture results pan sensitive e colii P:   Change abx to ceftriaxone for total 8d abx rx ; 4 more days from 03/06/2015 (set end date)    ENDOCRINE A: No active issues P:   Montior BS on bmet  Add SSI if BS >180   NEUROLOGIC A: Altered Mental Status Chronic pain/headaches on narcs at home   -hypoactive delirium + but repeatedly saying want to be with god. Frustrated by tid heparin shots - see this has healthcare providers hurting her   P:    monitor for improvement. Use fentanyl prn (change from morphine due to AKI) for pain     BEST PRACTICE / DISPOSITION Level of Care:  ICU -> SDU Primary Service:  PCCM ->tRH primary from 03/07/15 Consultants:  None Code Status:  Full -> Palliative care consult called 03/06/2015 - significant care issues at home  Diet:  NPO -> speech eval DVT Px:  Heparin > change to SCD 03/06/2015 due to pain and patient perception we are hurting her GI Px:  None Skin Integrity:  Intact Social / Family:  Daughter updated 03/06/2015   FAMILY IDT meet 03/06/2015 in presence of RN and pharmacy at bedside with son in law, daughter, and grandaughter - pallaitive care consult for goals of care called    Dr. Kalman Shan, M.D., [facility].C.P Pulmonary and Critical  Care Medicine Staff Physician [facility] [place] [facility] Critical [facility] Pager: [phone], If no answer or between  15:00h - 7:00h: call [phone]  03/06/2015 11:52 AM

## 2015-03-06 NOTE — Progress Notes (Signed)
Initial Nutrition Assessment  INTERVENTION:  Diet advancement per MD/SLP Add Ensure Enlive once daily when diet advanced   NUTRITION DIAGNOSIS:   Predicted suboptimal nutrient intake related to lethargy/confusion as evidenced by NPO status.   GOAL:   Patient will meet greater than or equal to 90% of their needs   MONITOR:   Diet advancement, PO intake, Labs, Weight trends, Skin  REASON FOR ASSESSMENT:   Low Braden    ASSESSMENT:   81 y/o woman with a history of COPD, tobacco abuse, AF, complete heart block s/p Bi-V ICD/pacemaker insertion who was brought to the ED with worsening dyspnea and confusion.   Pt has been NPO since admission, 3 days. Pt's family at bedside state that patient was eating very well PTA, usually has a good appetite; she eats soft foods due to dentures. No weight loss and no use of nutritional supplements per family. Pt states she is very hungry at time of visit. Noted severe muscle wasting of clavicles and mild fat wasting of thoracic region but, otherwise pt appears well-nourished.  Per MD note, plan for SLP evaluation and possible diet advancement.   Labs: low hemoglobin, low calcium   Diet Order:  Diet NPO time specified  Skin:  Wound (see comment) (stage I pressure ulcer on sacrum)  Last BM:  8/19  Height:   Ht Readings from Last 1 Encounters:  03/03/15  (1.575 m)    Weight:   Wt Readings from Last 1 Encounters:  03/06/15 124 lb 1.9 oz (56.3 kg)    Ideal Body Weight:  50 kg  BMI:  Body mass index is 22.7 kg/(m^2).  Estimated Nutritional Needs:   Kcal:  1350-1550  Protein:  80-90 grams  Fluid:  1.3-1.5 L/day  EDUCATION NEEDS:   No education needs identified at this time  Dorothea Ogle RD, LDN Inpatient Clinical Dietitian Pager: [phone] After Hours Pager: [phone]

## 2015-03-06 NOTE — Consult Note (Addendum)
Goals of care discussion for clarification.  See H&P for detail medical history.    Assessment includes 81 year old debilitating patient who lives with her only daugher, [name] who also has HCPOA for her mother.   [name] had a fall  spine fracture,  She spent several weeks in [facility] for rehab. Then after 4 weeks she returned home.[name] admits that she returned home with somewhat less ADL/ability to get around.  She was discharged back home placing most of the burden of care on [name].  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  We discussed the living will documents and the details that are enclosed.    [name] speaks often about wanting to be in heaven, that she is ready to see family members who have passed.  I encouraged [name] to use this evening to discuss quality of lie with her family and what abiding by someone's wishes truly means.  I believe that tomorrow we will convert [name] to DNR and then continue to assist the family determine the trajectory of the illness as it comes along.  [name] does not desire artificial feeding/PEG feeding at this time, aligning with advanced directive declaration of her mother.  Good discussion, [name] if a very thoughtful and understanding daughter  I will follow up with her tomorrow to determine what changes if any could be made to her clinical plan that would align with her wishes.  Lisa Presume, RN-BC, MSN, [facility], [place] Palliative Medicine

## 2015-03-06 NOTE — Care Management Important Message (Signed)
Important Message  Patient Details  Name: TYNIA WIERS MRN: [id number] Date of Birth: February 12, 1931   Medicare Important Message Given:  Yes-second notification given    Vangie Bicker, RN 03/06/2015, 10:35 AMImportant Message  Patient Details  Name: DEVYNE HAUGER MRN: [id number] Date of Birth: [birth year]   Medicare Important Message Given:  Yes-second notification given    Vangie Bicker, RN 03/06/2015, 10:35 AM

## 2015-03-07 DIAGNOSIS — J438 Other emphysema: Secondary | ICD-10-CM

## 2015-03-07 DIAGNOSIS — N179 Acute kidney failure, unspecified: Secondary | ICD-10-CM | POA: Diagnosis present

## 2015-03-07 DIAGNOSIS — J810 Acute pulmonary edema: Secondary | ICD-10-CM | POA: Diagnosis present

## 2015-03-07 DIAGNOSIS — B9620 Unspecified Escherichia coli [E. coli] as the cause of diseases classified elsewhere: Secondary | ICD-10-CM | POA: Diagnosis present

## 2015-03-07 DIAGNOSIS — R410 Disorientation, unspecified: Secondary | ICD-10-CM | POA: Diagnosis present

## 2015-03-07 DIAGNOSIS — I480 Paroxysmal atrial fibrillation: Secondary | ICD-10-CM | POA: Diagnosis present

## 2015-03-07 DIAGNOSIS — N390 Urinary tract infection, site not specified: Secondary | ICD-10-CM

## 2015-03-07 DIAGNOSIS — E876 Hypokalemia: Secondary | ICD-10-CM | POA: Diagnosis present

## 2015-03-07 DIAGNOSIS — E860 Dehydration: Secondary | ICD-10-CM | POA: Diagnosis present

## 2015-03-07 LAB — CBC WITH DIFFERENTIAL/PLATELET
BASOS ABS: 0 10*3/uL (ref 0.0–0.1)
Basophils Relative: 0 % (ref 0–1)
Eosinophils Absolute: 0.1 10*3/uL (ref 0.0–0.7)
Eosinophils Relative: 1 % (ref 0–5)
HEMATOCRIT: 31 % — AB (ref 36.0–46.0)
HEMOGLOBIN: 10.2 g/dL — AB (ref 12.0–15.0)
LYMPHS PCT: 12 % (ref 12–46)
Lymphs Abs: 1.1 10*3/uL (ref 0.7–4.0)
MCH: 29 pg (ref 26.0–34.0)
MCHC: 32.9 g/dL (ref 30.0–36.0)
MCV: 88.1 fL (ref 78.0–100.0)
MONO ABS: 0.4 10*3/uL (ref 0.1–1.0)
MONOS PCT: 4 % (ref 3–12)
NEUTROS ABS: 7.8 10*3/uL — AB (ref 1.7–7.7)
Neutrophils Relative %: 83 % — ABNORMAL HIGH (ref 43–77)
Platelets: 113 10*3/uL — ABNORMAL LOW (ref 150–400)
RBC: 3.52 MIL/uL — ABNORMAL LOW (ref 3.87–5.11)
RDW: 14.2 % (ref 11.5–15.5)
WBC: 9.4 10*3/uL (ref 4.0–10.5)

## 2015-03-07 LAB — BASIC METABOLIC PANEL
ANION GAP: 5 (ref 5–15)
BUN: 24 mg/dL — ABNORMAL HIGH (ref 6–20)
CALCIUM: 8.2 mg/dL — AB (ref 8.9–10.3)
CHLORIDE: 113 mmol/L — AB (ref 101–111)
CO2: 24 mmol/L (ref 22–32)
Creatinine, Ser: 1.24 mg/dL — ABNORMAL HIGH (ref 0.44–1.00)
GFR calc Af Amer: 46 mL/min — ABNORMAL LOW (ref >60)
GFR calc non Af Amer: 39 mL/min — ABNORMAL LOW (ref >60)
GLUCOSE: 77 mg/dL (ref 65–99)
Potassium: 3.6 mmol/L (ref 3.5–5.1)
Sodium: 142 mmol/L (ref 135–145)

## 2015-03-07 LAB — PHOSPHORUS: Phosphorus: 2.3 mg/dL — ABNORMAL LOW (ref 2.5–4.6)

## 2015-03-07 LAB — MAGNESIUM: Magnesium: 2 mg/dL (ref 1.7–2.4)

## 2015-03-07 LAB — LACTIC ACID, PLASMA: Lactic Acid, Venous: 0.8 mmol/L (ref 0.5–2.0)

## 2015-03-07 MED ORDER — LORAZEPAM 2 MG/ML IJ SOLN
0.5000 mg | Freq: Four times a day (QID) | INTRAMUSCULAR | Status: DC | PRN
  Administered 2015-03-09 – 2015-03-10 (×2): 0.5 mg via INTRAVENOUS
  Filled 2015-03-07 (×2): qty 1

## 2015-03-07 MED ORDER — DEXTROSE-NACL 5-0.9 % IV SOLN: INTRAVENOUS | Status: DC

## 2015-03-07 NOTE — Progress Notes (Signed)
Patient present on floor. Patient settled in. Alert to self and stable.

## 2015-03-07 NOTE — Progress Notes (Signed)
[name]  [name] ZOX:[id number] DOB: July 09, 1931 DOA: 03/03/2015 PCP: Lupita Raider, MD  Admit HPI / Brief Narrative: [name] is an 81 y/o WF PMHx  COPD, tobacco abuse, Paroxysmal Atrial Fibrillation, Complete Heart Block S/P Bi-V ICD/pacemaker insertion   Brought to the ED with worsening dyspnea and confusion. The patient had been with her family down in [facility], but 24 hours prior to her presentation she began to complain of dyspnea. She had run out of her Symbicort, so her daughter brought her home a day early to provide the medication, but only minimal relief was provided. The patient also began to develop some mild confusion, and as her symptoms progressed, EMS was called. In the ED, the patient was found to have a low-grade fever, a marked leukocytosis, and hypotension. A U/A was suggestive of urinary tract infection. PCCM was called for admission.   8/22;hypoactive delirum + per RN - during wakefulness "I want to go see my mom with Jesus . Do not hurt my daughter by keeping me here. Why is God keeping me here when he knows I need to be there." . RN reports patient has beenpraying for death   HPI/Subjective: Mar 23, 2023 A/O 4. Patient was able to relate the whole story of going down to [facility] for her birthday and becoming ill and having to be brought back for hospitalization. Patient's only desire is to have some water.  Assessment/Plan: Septic shock/urinary tract infection Escherichia coli -Resolved -Will continue gentle hydration as patient has failed her swallow evaluation increase D5-0.45% saline to  30 ml/hr -Continue ceftriaxone  COPD/Interstitial pulmonary edema -Patient's PCXR from 8/22 showed pulmonary edema Rt>>Lt -Strict I&O since admission + 3.3 L -Although patient is positive fluid load since admission patient's lips and mucosa are very dry with decreased skin turgor, indicating hypovolemic overall. -increase  D5-0.45% saline to  30 ml/hr -A.m. PCXR; check interval change  Dehydration -See pulmonary edema -Have counseled at length the risks of patient sucking on ice cubes for comfort to include pneumonia and death. Patient states willing to accept the risk.   Paroxysmal atrial fibrillation/CHF s/p Bi-V ICD -Will attempt to maintain even fluid balance.  Acute renal failure  -Improving continue to monitor closely   Hypomagnesemia -Magnesium goal> 2  Hypokalemia -Potassium goal> 4  Altered Mental Status -Resolved, patient alert and oriented 4     Code Status: FULL Family Communication: no family present at time of exam Disposition Plan:     Consultants:   Procedure/Significant Events:    Culture 8/19 blood left AC/central line negative 8/19 urine positive Escherichia coli pansensitive 8/20 negative MRSA by PCR   Antibiotics: Vanc: 8/19--03/06/2015 Pip-tazo: 8/19--03/06/2015 Ceftriaxone 8/22 (e Colii UTI) >>   DVT prophylaxis:    Devices    LINES / TUBES:      Continuous Infusions: . dextrose 5 % and 0.45% NaCl 30 mL/hr (03-23-15 1554)    Objective: VITAL SIGNS: Temp: 99 F (37.2 C) 2023-03-23 1528) Temp Source: Axillary 23-Mar-2023 1528) BP: 142/69 mmHg 2023-03-23 1400) Pulse Rate: 69 23-Mar-2023 1400) SPO2; FIO2:   Intake/Output Summary (Last 24 hours) at March 23, 2015 1621 Last data filed at 03/23/2015 1400  Gross per 24 hour  Intake    420 ml  Output    426 ml  Net     -6 ml     Exam: General: A/O 4, complains of dry mouth or throat, No acute respiratory distress Eyes: Negative headache, eye pain,negative scleral  hemorrhage ENT: Negative Runny nose,negative gingival bleeding, Neck:  Negative scars, masses, positive torticollis, negative lymphadenopathy, JVD Lungs: Clear to auscultation bilaterally without wheezes or crackles Cardiovascular: Regular rate and rhythm without murmur gallop or rub normal S1 and S2 Abdomen:negative abdominal pain, negative  dysphagia, nondistended, positive soft, bowel sounds, no rebound, no ascites, no appreciable mass Extremities: No significant cyanosis, clubbing, or edema bilateral lower extremities Psychiatric:  Negative depression, negative anxiety, negative fatigue, negative mania  Neurologic:  Cranial nerves II through XII intact, tongue/uvula midline, all extremities muscle strength 5/5, sensation intact throughout, negative dysarthria, negative expressive aphasia, negative receptive aphasia.      Data Reviewed: Basic Metabolic Panel:  Recent Labs Lab 03/03/15 1715 03/03/15 2320 03/04/15 0510 03/05/15 0513 03/06/15 0417 03/07/15 0354  NA 140  --  140 140 140 142  K 4.2  --  4.4 3.9 3.5 3.6  CL 108  --  112* 111 112* 113*  CO2 23  --  21* 21* 22 24  GLUCOSE 87  --  104* 73 94 77  BUN 25*  --  22* 31* 30* 24*  CREATININE 1.79* 1.60* 1.69* 1.78* 1.54* 1.24*  CALCIUM 8.2*  --  6.7* 7.5* 7.7* 8.2*  MG  --   --  1.6* 2.2  --  2.0  PHOS  --   --  4.6  --   --  2.3*   Liver Function Tests:  Recent Labs Lab 03/03/15 1715  AST 92*  ALT 42  ALKPHOS 109  BILITOT 1.4*  PROT 6.2*  ALBUMIN 3.4*    Recent Labs Lab 03/03/15 2320  LIPASE 18*   No results for input(s): AMMONIA in the last 168 hours. CBC:  Recent Labs Lab 03/03/15 1715 03/03/15 2320 03/04/15 0510 03/05/15 0513 03/06/15 0417 03/07/15 0354  WBC 18.7* 34.2* 38.0* 16.0* 15.0* 9.4  NEUTROABS 17.5*  --   --  14.7* 13.6* 7.8*  HGB 12.0 10.7* 10.7* 9.5* 9.6* 10.2*  HCT 37.8 33.3* 32.9* 29.7* 30.1* 31.0*  MCV 91.1 92.8 91.4 90.5 89.1 88.1  PLT 112* 162 168 116* 122* 113*   Cardiac Enzymes: No results for input(s): CKTOTAL, CKMB, CKMBINDEX, TROPONINI in the last 168 hours. BNP (last 3 results) No results for input(s): BNP in the last 8760 hours.  ProBNP (last 3 results) No results for input(s): PROBNP in the last 8760 hours.  CBG: No results for input(s): GLUCAP in the last 168 hours.  Recent Results (from the  past 240 hour(s))  Culture, blood (routine x 2)     Status: None (Preliminary result)   Collection Time: 03/03/15  4:21 PM  Result Value Ref Range Status   Specimen Description BLOOD LEFT ANTECUBITAL  Final   Special Requests BOTTLES DRAWN AEROBIC ONLY  Final   Culture   Final    NO GROWTH 4 DAYS Performed at [facility]    Report Status PENDING  Incomplete  Urine culture     Status: None   Collection Time: 03/03/15  7:34 PM  Result Value Ref Range Status   Specimen Description URINE, CATHETERIZED  Final   Special Requests NONE  Final   Culture   Final    >=100,000 COLONIES/mL ESCHERICHIA COLI Performed at [facility]    Report Status 03/06/2015 FINAL  Final   Organism ID, Bacteria ESCHERICHIA COLI  Final      Susceptibility   Escherichia coli - MIC*    AMPICILLIN 8 SENSITIVE Sensitive     CEFAZOLIN <=4  SENSITIVE Sensitive     CEFTRIAXONE <=1 SENSITIVE Sensitive     CIPROFLOXACIN <=0.25 SENSITIVE Sensitive     GENTAMICIN <=1 SENSITIVE Sensitive     IMIPENEM 0.5 SENSITIVE Sensitive     NITROFURANTOIN <=16 SENSITIVE Sensitive     TRIMETH/SULFA <=20 SENSITIVE Sensitive     AMPICILLIN/SULBACTAM 4 SENSITIVE Sensitive     PIP/TAZO <=4 SENSITIVE Sensitive     * >=100,000 COLONIES/mL ESCHERICHIA COLI  Culture, blood (routine x 2)     Status: None (Preliminary result)   Collection Time: 03/03/15 10:39 PM  Result Value Ref Range Status   Specimen Description BLOOD CENTRAL LINE  Final   Special Requests BOTTLES DRAWN AEROBIC AND ANAEROBIC 5CC  Final   Culture   Final    NO GROWTH 3 DAYS Performed at [facility]    Report Status PENDING  Incomplete  MRSA PCR Screening     Status: None   Collection Time: 03/04/15  1:36 AM  Result Value Ref Range Status   MRSA by PCR NEGATIVE NEGATIVE Final    Comment:        The GeneXpert MRSA Assay (FDA approved for NASAL specimens only), is one component of a comprehensive MRSA colonization surveillance program.  It is not intended to diagnose MRSA infection nor to guide or monitor treatment for MRSA infections.      Studies:  Recent x-ray studies have been reviewed in detail by the Attending Physician  Scheduled Meds:  Scheduled Meds: . antiseptic oral rinse  7 mL Mouth Rinse q12n4p  . arformoterol  15 mcg Nebulization BID  . budesonide (PULMICORT) nebulizer solution  0.25 mg Nebulization BID  . cefTRIAXone (ROCEPHIN)  IV  1 g Intravenous Q24H  . chlorhexidine  15 mL Mouth Rinse BID  . escitalopram  20 mg Oral Daily    Time spent on care of this patient: 40 mins   Sharia Averitt, Roselind Messier , MD  [facility] Office  [phone] Pager - [phone]  On-Call/Text Page:      [email or web address]      password [email or web address]  If 7PM-7AM, please contact night-coverage [email or web address] Password [email or web address] 03/07/2015, 4:21 PM   LOS: 4 days   Care during the described time interval was provided by me .  I have reviewed this patient's available data, including medical history, events of Arellano, physical examination, and all test results as part of my evaluation. I have personally reviewed and interpreted all radiology studies.   Carolyne Littles, MD [phone] Pager

## 2015-03-07 NOTE — Evaluation (Signed)
Clinical/Bedside Swallow Evaluation Patient Details  Name: [name] MRN: [id number] Date of Birth: 01-26-1931  Today's Date: 03/07/2015 Time: SLP Start Time (ACUTE ONLY): 0930 SLP Stop Time (ACUTE ONLY): 0940 SLP Time Calculation (min) (ACUTE ONLY): 10 min  Past Medical History:  Past Medical History  Diagnosis Date  . Tobacco abuse   . Paroxysmal atrial fibrillation     AICD/pacemaker  . Pulmonary nodule   . COPD (chronic obstructive pulmonary disease)     Spirometry 06/10/05, FEV1 46% and ratio 51% with 14% better after albuterol   Past Surgical History:  Past Surgical History  Procedure Laterality Date  . Pacemaker insertion    . Soft tissue tumor resection      From right forearm  . Skin graft    . Biv pacemaker generator change out N/A 07/28/2014    Procedure: BIV PACEMAKER GENERATOR CHANGE OUT;  Surgeon: Marinus Maw, MD;  Location: [facility] CATH LAB;  Service: Cardiovascular;  Laterality: N/A;   HPI:  Ms. [name] is an 81 y/o woman with a history of COPD, tobacco abuse, AF, complete heart block s/p Bi-V ICD/pacemaker insertion who was brought to the ED with worsening dyspnea and confusion. The patient also began to develop some mild confusion, patient was found to have a low-grade fever, a marked leukocytosis, and hypotension. A U/A was suggestive of urinary tract infection. Pt with a history of dysphagia per family but no SLp notes in chart. Pt lives with family with 24 supervision. Family and pt discussing goals of care with Palliative team. CXR shows Cardiomegaly with bilateral pulmonary alveolar infiltrates and pleural effusions consistent with congestive heart failure. CT head clear.   Assessment / Plan / Recommendation Clinical Impression  Pt demonstrates acutely impaired swallow function characterized by decreased awareness and timing of swallow secondary to AMS.  Pt is at risk of aspiration due to prior history of dysphagia (during prior hospital admission),  generalized weakness and poor airway protection evidenced by weak cough. SLP provided repositioning, max verbal, tactile and hand over hand assist to increase pts awareness of PO presentation. Pt demonstrates signs of aspiration with thin liquids via cough, likely due to premature spillage to airway. Pt consumed puree without apparent difficulty and is recommended to remain NPO except for meds in puree until mental status further improves for diet advancement or objective assessment, pending decisions on plan of care.    Aspiration Risk  Severe    Diet Recommendation NPO except meds   Medication Administration: Whole meds with puree    Other  Recommendations Oral Care Recommendations: Oral care QID   Follow Up Recommendations       Frequency and Duration min 2x/week  2 weeks   Pertinent Vitals/Pain NA    SLP Swallow Goals     Swallow Study Prior Functional Status       General Other Pertinent Information: Ms. [name] is an 81 y/o woman with a history of COPD, tobacco abuse, AF, complete heart block s/p Bi-V ICD/pacemaker insertion who was brought to the ED with worsening dyspnea and confusion. The patient also began to develop some mild confusion, patient was found to have a low-grade fever, a marked leukocytosis, and hypotension. A U/A was suggestive of urinary tract infection. Pt with a history of dysphagia per family but no SLp notes in chart. Pt lives with family with 24 supervision. Family and pt discussing goals of care with Palliative team. CXR shows Cardiomegaly with bilateral pulmonary alveolar infiltrates and pleural effusions consistent  with congestive heart failure. CT head clear. Type of Study: Bedside swallow evaluation Previous Swallow Assessment: none Diet Prior to this Study: NPO Temperature Spikes Noted: No Respiratory Status: Room air History of Recent Intubation: No Behavior/Cognition: Confused;Lethargic/Drowsy;Requires cueing;Doesn't follow directions Oral  Cavity - Dentition: Edentulous Self-Feeding Abilities: Needs assist;Total assist Patient Positioning: Upright in bed Baseline Vocal Quality: Low vocal intensity Volitional Cough: Weak Volitional Swallow: Able to elicit    Oral/Motor/Sensory Function Overall Oral Motor/Sensory Function: Impaired at baseline Labial ROM: Reduced right Labial Symmetry: Abnormal symmetry right Labial Strength: Reduced Lingual ROM: Other (Comment) (did not follow command well, generally weak) Lingual Symmetry: Within Functional Limits Lingual Strength: Reduced Facial ROM: Reduced right Facial Symmetry: Right droop Facial Strength: Reduced   Ice Chips Ice chips: Impaired Presentation: Spoon Oral Phase Impairments: Poor awareness of bolus;Impaired anterior to posterior transit Oral Phase Functional Implications: Prolonged oral transit Pharyngeal Phase Impairments: Suspected delayed Swallow   Thin Liquid Thin Liquid: Impaired Presentation: Cup;Straw;Self Fed Oral Phase Impairments: Poor awareness of bolus Oral Phase Functional Implications: Prolonged oral transit;Oral holding;Right anterior spillage Pharyngeal  Phase Impairments: Suspected delayed Swallow;Multiple swallows;Cough - Immediate    Nectar Thick Nectar Thick Liquid: Not tested   Honey Thick Honey Thick Liquid: Not tested   Puree Puree: Within functional limits   Solid   GO    Solid: Not tested      Harlon Ditty, MA CCC-SLP [phone]  Claudine Mouton 03/07/2015,10:03 AM

## 2015-03-07 NOTE — Consult Note (Signed)
Follow-up family  Meeting with daughter [name]) and her husband.  Goals are aligning with transition to comfort.  Swallow evaluation failed, NPO status, risks discussed.  Artificial nutrition/hydration are not part of goals at this time, full risks vs benefits discussed in light of decline, co-morbidities and likely trajectory.  Transition to d/c discussion included the possibility of inpatient hospice.  Hospice benefit information given verbally and most likely this will be the referral request in the next day or so.  [name] will transition out of ICU today focus will turn to comfort measures, code status to DNR.  To follow up with patient and family in the AM and further work on transition as appropriate.  Dr. Joseph Art notified of discussion.  Symptom management goals in place.  Cindra Presume, RN-BC, MSN, [facility] Palliative Medicine [phone]

## 2015-03-08 ENCOUNTER — Inpatient Hospital Stay (HOSPITAL_COMMUNITY): Payer: Medicare Other

## 2015-03-08 DIAGNOSIS — R410 Disorientation, unspecified: Secondary | ICD-10-CM

## 2015-03-08 DIAGNOSIS — E876 Hypokalemia: Secondary | ICD-10-CM

## 2015-03-08 DIAGNOSIS — J810 Acute pulmonary edema: Secondary | ICD-10-CM

## 2015-03-08 DIAGNOSIS — N179 Acute kidney failure, unspecified: Secondary | ICD-10-CM

## 2015-03-08 DIAGNOSIS — R6521 Severe sepsis with septic shock: Secondary | ICD-10-CM

## 2015-03-08 DIAGNOSIS — B9620 Unspecified Escherichia coli [E. coli] as the cause of diseases classified elsewhere: Secondary | ICD-10-CM

## 2015-03-08 DIAGNOSIS — E860 Dehydration: Secondary | ICD-10-CM

## 2015-03-08 DIAGNOSIS — A419 Sepsis, unspecified organism: Principal | ICD-10-CM

## 2015-03-08 DIAGNOSIS — I480 Paroxysmal atrial fibrillation: Secondary | ICD-10-CM

## 2015-03-08 DIAGNOSIS — N390 Urinary tract infection, site not specified: Secondary | ICD-10-CM

## 2015-03-08 LAB — CBC WITH DIFFERENTIAL/PLATELET
BASOS ABS: 0 10*3/uL (ref 0.0–0.1)
Basophils Relative: 0 % (ref 0–1)
EOS PCT: 0 % (ref 0–5)
Eosinophils Absolute: 0 10*3/uL (ref 0.0–0.7)
HEMATOCRIT: 30.7 % — AB (ref 36.0–46.0)
HEMOGLOBIN: 10.2 g/dL — AB (ref 12.0–15.0)
LYMPHS ABS: 1.3 10*3/uL (ref 0.7–4.0)
LYMPHS PCT: 15 % (ref 12–46)
MCH: 29.2 pg (ref 26.0–34.0)
MCHC: 33.2 g/dL (ref 30.0–36.0)
MCV: 88 fL (ref 78.0–100.0)
Monocytes Absolute: 0.6 10*3/uL (ref 0.1–1.0)
Monocytes Relative: 7 % (ref 3–12)
NEUTROS ABS: 6.4 10*3/uL (ref 1.7–7.7)
Neutrophils Relative %: 77 % (ref 43–77)
PLATELETS: 109 10*3/uL — AB (ref 150–400)
RBC: 3.49 MIL/uL — AB (ref 3.87–5.11)
RDW: 14.1 % (ref 11.5–15.5)
WBC: 8.3 10*3/uL (ref 4.0–10.5)

## 2015-03-08 LAB — BASIC METABOLIC PANEL
ANION GAP: 9 (ref 5–15)
BUN: 22 mg/dL — AB (ref 6–20)
CHLORIDE: 110 mmol/L (ref 101–111)
CO2: 22 mmol/L (ref 22–32)
Calcium: 8 mg/dL — ABNORMAL LOW (ref 8.9–10.3)
Creatinine, Ser: 1 mg/dL (ref 0.44–1.00)
GFR calc Af Amer: 59 mL/min — ABNORMAL LOW (ref >60)
GFR calc non Af Amer: 51 mL/min — ABNORMAL LOW (ref >60)
GLUCOSE: 95 mg/dL (ref 65–99)
POTASSIUM: 3.4 mmol/L — AB (ref 3.5–5.1)
Sodium: 141 mmol/L (ref 135–145)

## 2015-03-08 LAB — PHOSPHORUS: Phosphorus: 2.5 mg/dL (ref 2.5–4.6)

## 2015-03-08 LAB — CULTURE, BLOOD (ROUTINE X 2): CULTURE: NO GROWTH

## 2015-03-08 LAB — MAGNESIUM: Magnesium: 1.9 mg/dL (ref 1.7–2.4)

## 2015-03-08 MED ORDER — WHITE PETROLATUM GEL
Status: AC
  Administered 2015-03-08: 13:00:00
  Filled 2015-03-08: qty 1

## 2015-03-08 MED ORDER — SODIUM CHLORIDE 0.9 % IJ SOLN
10.0000 mL | INTRAMUSCULAR | Status: DC | PRN
  Administered 2015-03-08 – 2015-03-10 (×4): 20 mL
  Administered 2015-03-10: 30 mL
  Filled 2015-03-08 (×4): qty 40

## 2015-03-08 NOTE — Consult Note (Signed)
Continued goals fo care discussion with daughter [name].  Many questions answered again and reinforced regarding comfort vs treatment and her mothers goals as verbalized and documented on advanced directive documentation.  [name] is comfortable with moving on to full comfort care with symptom management.  CSW will be contacted to refer for inpatient hospice eligibility at [facility].  Patient is in bed, resting, arouses to touch only, with an occasional mumble.  Does not appear to be in pain at this time, medication had been ordered for symptom management.  She has not taken any PO intake other than ice chips, and has not requested anything.  Cognitive decline is noted from previous assessment yesterday.  Prognosis with comfort care only, no hydration or nutrition is only a few, most likely 2 weeks.    CSW will be contacted, [name] will be updated as transition moves along.  Cindra Presume, RN-BC, MSN, [facility] Palliative Care

## 2015-03-08 NOTE — Progress Notes (Signed)
Nutrition Brief Note  Chart reviewed. Pt now transitioning to comfort care.  No further nutrition interventions warranted at this time.  Please re-consult as needed.   Sutton Hirsch A. Nainika Newlun, RD, LDN, CDE Pager: [phone] After hours Pager: [phone]  

## 2015-03-08 NOTE — Progress Notes (Signed)
[facility]                                                                              Patient Demographics  [name], is a 81 y.o. female, DOB - [birth year], ZOX:[id number]  Admit date - 03/03/2015   Admitting Physician Jamie Kato, MD  Outpatient Primary MD for the patient is Lupita Raider, MD  LOS - 5   Chief Complaint  Patient presents with  . Diarrhea  . Weakness  . Shortness of Breath      HPI on 03/03/2015 by Dr. Jamie Kato [name] is an 81 y/o woman with a history of COPD, tobacco abuse, AF, complete heart block s/p Bi-V ICD/pacemaker insertion who was brought to the ED with worsening dyspnea and confusion. The patient had been with her family down in [facility], but 24 hours prior to her presentation she began to complain of dyspnea. She had run out of her Symbicort, so her daughter brought her home a day early to provide the medication, but only minimal relief was provided. The patient also began to develop some mild confusion, and as her symptoms progressed, EMS was called. In the ED, the patient was found to have a low-grade fever, a marked leukocytosis, and hypotension. A U/A was suggestive of urinary tract infection. PCCM was called for admission.   Assessment & Plan   Septic shock/urinary tract infection Escherichia coli -Resolved -Continue IV hydration -Continue ceftriaxone  Goals of care -It seems that patient has been praying for death -She is currently alert and oriented -Palliative care consultation and appreciate, pending further palliative care input regarding possible inpatient hospice  COPD/Interstitial pulmonary edema -Patient's PCXR from 8/22 showed pulmonary edema Rt>>Lt -Chest x-ray 03/08/2015 shows improvement of COPD and CHF -Continue to monitor intake and output, daily weights -Will continue IV fluid hydration as patient appears to be clinically dry  Dehydration -See pulmonary edema -Previous hospital spoke with  patient regarding the risks of sucking ice cubes including pneumonia and death. Patient accepted the risk.   Paroxysmal atrial fibrillation/CHF s/p Bi-V ICD -Will attempt to maintain even fluid balance.  Acute renal failure  -Resolved, continue to monitor BMP  Hypomagnesemia -Resolved, continue to monitor  Hypokalemia -Continue to monitor BMP and replace potassium as needed  Altered Mental Status -Resolved, patient alert and oriented 4 -Possibly secondary to urinary tract infection and dehydration\  Normocytic anemia -Hemoglobin currently remaining stable, continue to monitor CBC  Code Status: DO NOT RESUSCITATE  Family Communication: None at bedside  Disposition Plan: Admitted, pending further palliative discussion  Time Spent in minutes   30 minutes  Procedures  None  Consults   Palliative care PCCM  DVT Prophylaxis  SCDs  Lab Results  Component Value Date   PLT 109* 03/08/2015    Medications  Scheduled Meds: . antiseptic oral rinse  7 mL Mouth Rinse q12n4p  . arformoterol  15 mcg Nebulization BID  . budesonide (PULMICORT) nebulizer solution  0.25 mg Nebulization BID  . cefTRIAXone (ROCEPHIN)  IV  1 g Intravenous Q24H  . chlorhexidine  15 mL Mouth Rinse BID  . escitalopram  20 mg Oral Daily   Continuous Infusions: . dextrose 5 %  and 0.45% NaCl 30 mL/hr (03/07/15 1554)   PRN Meds:.LORazepam, morphine injection  Antibiotics    Anti-infectives    Start     Dose/Rate Route Frequency Ordered Stop   03/06/15 2000  vancomycin (VANCOCIN) IVPB 750 mg/150 ml premix  Status:  Discontinued     750 mg 150 mL/hr over 60 Minutes Intravenous Every 48 hours 03/05/15 1216 03/05/15 1222   03/06/15 1500  cefTRIAXone (ROCEPHIN) 1 g in dextrose 5 % 50 mL IVPB     1 g 100 mL/hr over 30 Minutes Intravenous Every 24 hours 03/06/15 1137     03/05/15 1400  piperacillin-tazobactam (ZOSYN) IVPB 2.25 g  Status:  Discontinued     2.25 g 100 mL/hr over 30 Minutes Intravenous  3 times per day 03/05/15 1216 03/06/15 1137   03/05/15 0600  vancomycin (VANCOCIN) 500 mg in sodium chloride 0.9 % 100 mL IVPB  Status:  Discontinued     500 mg 100 mL/hr over 60 Minutes Intravenous Every 24 hours 03/04/15 2040 03/04/15 2042   03/04/15 2200  piperacillin-tazobactam (ZOSYN) IVPB 3.375 g  Status:  Discontinued     3.375 g 12.5 mL/hr over 240 Minutes Intravenous 3 times per day 03/04/15 2040 03/04/15 2042   03/04/15 2000  vancomycin (VANCOCIN) 500 mg in sodium chloride 0.9 % 100 mL IVPB  Status:  Discontinued     500 mg 100 mL/hr over 60 Minutes Intravenous Every 24 hours 03/03/15 2338 03/05/15 1216   03/04/15 0600  piperacillin-tazobactam (ZOSYN) IVPB 3.375 g  Status:  Discontinued     3.375 g 12.5 mL/hr over 240 Minutes Intravenous 3 times per day 03/03/15 2338 03/05/15 1216   03/03/15 1900  piperacillin-tazobactam (ZOSYN) IVPB 3.375 g     3.375 g 100 mL/hr over 30 Minutes Intravenous  Once 03/03/15 1854 03/03/15 1953   03/03/15 1900  vancomycin (VANCOCIN) IVPB 1000 mg/200 mL premix     1,000 mg 200 mL/hr over 60 Minutes Intravenous  Once 03/03/15 1854 03/03/15 2119      Subjective:   [name] seen and examined today.  Patient currently denies any chest pain or shortness of breath. She wishes to go. She does not understand why she still a hospital.    Objective:   Filed Vitals:   03/07/15 2155 03/08/15 0639 03/08/15 0834 03/08/15 1305  BP: 135/56 144/71    Pulse: 83 71    Temp: 98.4 F (36.9 C) 97.8 F (36.6 C)    TempSrc: Axillary Axillary    Resp: 22 22    Height:      Weight:      SpO2: 100% 100% 100% 97%    Wt Readings from Last 3 Encounters:  03/07/15 57.3 kg (126 lb 5.2 oz)  10/12/14 51.982 kg (114 lb 9.6 oz)  09/21/14 53.252 kg (117 lb 6.4 oz)     Intake/Output Summary (Last 24 hours) at 03/08/15 1354 Last data filed at 03/08/15 0617  Gross per 24 hour  Intake    180 ml  Output    900 ml  Net   -720 ml    Exam  General: Well  developed, thin appearing, NAD  HEENT: NCAT, mucous membranes slightly dry   Cardiovascular: S1 S2 auscultated, RRR  Respiratory: Clear to auscultation bilaterally with equal chest rise  Abdomen: Soft, nontender, nondistended, + bowel sounds  Extremities: warm dry without cyanosis clubbing or edema  Neuro: AAOx3, nonfocal  Psych: Depressed  Data Review   Micro Results Recent Results (from the  past 240 hour(s))  Culture, blood (routine x 2)     Status: None (Preliminary result)   Collection Time: 03/03/15  4:21 PM  Result Value Ref Range Status   Specimen Description BLOOD LEFT ANTECUBITAL  Final   Special Requests BOTTLES DRAWN AEROBIC ONLY  Final   Culture   Final    NO GROWTH 4 DAYS Performed at [facility]    Report Status PENDING  Incomplete  Urine culture     Status: None   Collection Time: 03/03/15  7:34 PM  Result Value Ref Range Status   Specimen Description URINE, CATHETERIZED  Final   Special Requests NONE  Final   Culture   Final    >=100,000 COLONIES/mL ESCHERICHIA COLI Performed at [facility]    Report Status 03/06/2015 FINAL  Final   Organism ID, Bacteria ESCHERICHIA COLI  Final      Susceptibility   Escherichia coli - MIC*    AMPICILLIN 8 SENSITIVE Sensitive     CEFAZOLIN <=4 SENSITIVE Sensitive     CEFTRIAXONE <=1 SENSITIVE Sensitive     CIPROFLOXACIN <=0.25 SENSITIVE Sensitive     GENTAMICIN <=1 SENSITIVE Sensitive     IMIPENEM 0.5 SENSITIVE Sensitive     NITROFURANTOIN <=16 SENSITIVE Sensitive     TRIMETH/SULFA <=20 SENSITIVE Sensitive     AMPICILLIN/SULBACTAM 4 SENSITIVE Sensitive     PIP/TAZO <=4 SENSITIVE Sensitive     * >=100,000 COLONIES/mL ESCHERICHIA COLI  Culture, blood (routine x 2)     Status: None (Preliminary result)   Collection Time: 03/03/15 10:39 PM  Result Value Ref Range Status   Specimen Description BLOOD CENTRAL LINE  Final   Special Requests BOTTLES DRAWN AEROBIC AND ANAEROBIC 5CC  Final   Culture    Final    NO GROWTH 3 DAYS Performed at [facility]    Report Status PENDING  Incomplete  MRSA PCR Screening     Status: None   Collection Time: 03/04/15  1:36 AM  Result Value Ref Range Status   MRSA by PCR NEGATIVE NEGATIVE Final    Comment:        The GeneXpert MRSA Assay (FDA approved for NASAL specimens only), is one component of a comprehensive MRSA colonization surveillance program. It is not intended to diagnose MRSA infection nor to guide or monitor treatment for MRSA infections.     Radiology Reports Dg Chest 1 View  03/03/2015   CLINICAL DATA:  Central line placement.  EXAM: CHEST  1 VIEW  COMPARISON:  Chest radiograph 03/03/2015  FINDINGS: Multi lead pacer apparatus overlies the left hemi thorax, leads are stable in position. Patient is rotated to the left. Stable cardiac and mediastinal contours. Coarse interstitial opacities bilaterally. Biapical pleural parenchymal thickening. New right IJ central venous catheter with tip projecting over the superior vena cava. No significant pleural effusion or pneumothorax. Lateral right rib fractures.  IMPRESSION: New right IJ central venous catheter tip projects over the superior vena cava.  Cardiomegaly and mild interstitial pulmonary edema.  Multiple age-indeterminate right lower lateral rib fractures. Recommend correlation for point tenderness.   Electronically Signed   By: Annia Belt M.D.   On: 03/03/2015 23:03   Dg Chest 2 View  03/03/2015   CLINICAL DATA:  Diarrhea for 2 days. Short of breath. Weakness and headache.  EXAM: CHEST  2 VIEW  COMPARISON:  None.  FINDINGS: Emphysema. Cardiomegaly. Aortic arch atherosclerosis. Pulmonary vascular congestion. LEFT subclavian 3 lead cardiac pacemaker. No focal consolidation.  Monitoring leads project over the chest.  IMPRESSION: Emphysema and cardiomegaly with pulmonary vascular congestion.   Electronically Signed   By: Andreas Newport M.D.   On: 03/03/2015 18:39   Ct Head Wo  Contrast  03/03/2015   CLINICAL DATA:  Weakness.  Headache.  EXAM: CT HEAD WITHOUT CONTRAST  TECHNIQUE: Contiguous axial images were obtained from the base of the skull through the vertex without intravenous contrast.  COMPARISON:  None.  FINDINGS: Torticollis requires scanning in an oblique plane. This is similar to prior exams.  No mass lesion, mass effect, midline shift, hydrocephalus, hemorrhage. No acute territorial cortical ischemia/infarct. Atrophy and chronic ischemic white matter disease is present. Calvarium appears intact.  IMPRESSION: Atrophy and chronic ischemic white matter disease without acute intracranial abnormality. Nonstandard imaging plane due to torticollis.   Electronically Signed   By: Andreas Newport M.D.   On: 03/03/2015 20:20   Dg Chest Port 1 View  03/08/2015   CLINICAL DATA:  Shortness of breath and weakness, pulmonary edema, septic shock, acute renal failure  EXAM: PORTABLE CHEST - 1 VIEW  COMPARISON:  Portable chest x-ray of March 06, 2015  FINDINGS: The lungs are adequately inflated. The interstitial markings remain increased but are slightly less conspicuous. There are bilateral pleural effusions greater on the left than on the right. The retrocardiac region on the left remains dense. The permanent pacemaker is in reasonable position radiographically. The cardiac silhouette is not enlarged. The pulmonary vascularity is less engorged. The right internal jugular venous catheter tip projects over the midportion of the SVC. The bones are diffusely osteopenic.  IMPRESSION: COPD and CHF with improving pulmonary interstitial edema. Persistent left basilar atelectasis and bilateral pleural effusions greater on the left than on the right.   Electronically Signed   By: David  [place] M.D.   On: 03/08/2015 07:31   Dg Chest Port 1 View  03/06/2015   CLINICAL DATA:  Shortness of breath.  Pulmonary edema.  EXAM: PORTABLE CHEST - 1 VIEW  COMPARISON:  03/03/2015 .  FINDINGS: Right IJ line in  stable position. Cardiac pacer in stable position. Cardiomegaly with mild pulmonary vascular prominence. Bilateral pulmonary alveolar infiltrates and pleural effusions. Findings consistent congestive heart failure.  IMPRESSION: 1. Right IJ line in stable position. 2. Cardiac pacer in stable position. Cardiomegaly with bilateral pulmonary alveolar infiltrates and pleural effusions consistent with congestive heart failure. Findings have progressed from prior exam .   Electronically Signed   By: Maisie Fus  Register   On: 03/06/2015 07:12    CBC  Recent Labs Lab 03/03/15 1715  03/04/15 0510 03/05/15 1610 03/06/15 0417 03/07/15 0354 03/08/15 0355  WBC 18.7*  < > 38.0* 16.0* 15.0* 9.4 8.3  HGB 12.0  < > 10.7* 9.5* 9.6* 10.2* 10.2*  HCT 37.8  < > 32.9* 29.7* 30.1* 31.0* 30.7*  PLT 112*  < > 168 116* 122* 113* 109*  MCV 91.1  < > 91.4 90.5 89.1 88.1 88.0  MCH 28.9  < > 29.7 29.0 28.4 29.0 29.2  MCHC 31.7  < > 32.5 32.0 31.9 32.9 33.2  RDW 14.2  < > 14.6 14.4 14.3 14.2 14.1  LYMPHSABS 0.3*  --   --  0.5* 0.9 1.1 1.3  MONOABS 0.8  --   --  0.8 0.5 0.4 0.6  EOSABS 0.0  --   --  0.0 0.1 0.1 0.0  BASOSABS 0.0  --   --  0.0 0.0 0.0 0.0  < > = values in this interval  not displayed.  Chemistries   Recent Labs Lab 03/03/15 1715  03/04/15 0510 03/05/15 0513 03/06/15 0417 03/07/15 0354 03/08/15 0355  NA 140  --  140 140 140 142 141  K 4.2  --  4.4 3.9 3.5 3.6 3.4*  CL 108  --  112* 111 112* 113* 110  CO2 23  --  21* 21* 22 24 22   GLUCOSE 87  --  104* 73 94 77 95  BUN 25*  --  22* 31* 30* 24* 22*  CREATININE 1.79*  < > 1.69* 1.78* 1.54* 1.24* 1.00  CALCIUM 8.2*  --  6.7* 7.5* 7.7* 8.2* 8.0*  MG  --   --  1.6* 2.2  --  2.0 1.9  AST 92*  --   --   --   --   --   --   ALT 42  --   --   --   --   --   --   ALKPHOS 109  --   --   --   --   --   --   BILITOT 1.4*  --   --   --   --   --   --   < > = values in this interval not  displayed. ------------------------------------------------------------------------------------------------------------------ estimated creatinine clearance is 34.3 mL/min (by C-G formula based on Cr of 1). ------------------------------------------------------------------------------------------------------------------ No results for input(s): HGBA1C in the last 72 hours. ------------------------------------------------------------------------------------------------------------------ No results for input(s): CHOL, HDL, LDLCALC, TRIG, CHOLHDL, LDLDIRECT in the last 72 hours. ------------------------------------------------------------------------------------------------------------------ No results for input(s): TSH, T4TOTAL, T3FREE, THYROIDAB in the last 72 hours.  Invalid input(s): FREET3 ------------------------------------------------------------------------------------------------------------------ No results for input(s): VITAMINB12, FOLATE, FERRITIN, TIBC, IRON, RETICCTPCT in the last 72 hours.  Coagulation profile  Recent Labs Lab 03/04/15 0600  INR 1.46    No results for input(s): DDIMER in the last 72 hours.  Cardiac Enzymes No results for input(s): CKMB, TROPONINI, MYOGLOBIN in the last 168 hours.  Invalid input(s): CK ------------------------------------------------------------------------------------------------------------------ Invalid input(s): POCBNP    Justiss Gerbino D.O. on 03/08/2015 at 1:54 PM  Between 7am to 7pm - Pager - [phone]  After 7pm go to [email or web address] - password [email or web address]  And look for the night coverage person covering for me after hours  [facility] Office  [phone]

## 2015-03-09 LAB — CULTURE, BLOOD (ROUTINE X 2): Culture: NO GROWTH

## 2015-03-09 MED ORDER — MORPHINE SULFATE (CONCENTRATE) 10 MG/0.5ML PO SOLN
5.0000 mg | Freq: Two times a day (BID) | ORAL | Status: DC
  Administered 2015-03-09 – 2015-03-10 (×2): 5 mg via SUBLINGUAL
  Filled 2015-03-09 (×2): qty 0.5

## 2015-03-09 NOTE — Consult Note (Signed)
Continuation of goals of care, transition to full comfort care at bedside with HCPOA, daughter [name].  Inpatient hosp[facility] bed at [facility] not available today, hoping for availability tomorrow as this would be the best discharge plan for symptom management, EOL care.  [name] is an only child,she and her family are all Ms. Ullery has locally.  Eligibility for inpatient hospice GIP request placed.  Symptom management orders in place for pain/respiratory distress.  Decreased UOP, skin condition/turgor is poor, multiple skin tears present, shallow respiratory effort, O2 is for comfort only.  Will follow up in AM.  Cindra Presume, RN-BC, MSN, [facility] Palliative Care

## 2015-03-09 NOTE — Care Management Important Message (Signed)
Important Message  Patient Details  Name: [name] MRN: [id number] Date of Birth: May 26, 1931   Medicare Important Message Given:  Yes-third notification given    Orson Aloe 03/09/2015, 12:06 PM

## 2015-03-09 NOTE — Clinical Social Work Note (Signed)
Per chart review, order for GIP evaluation has been placed. CSW signing off.  Marcelline Deist, [place] - [phone] Clinical Social Work Department Orthopedics [phone]) and Surgical [phone])

## 2015-03-09 NOTE — Progress Notes (Signed)
SLP Cancellation Note  Patient Details Name: [name] MRN: [id number] DOB: [birth year]   Cancelled treatment:       Reason Eval/Treat Not Completed: Other (comment) Per chart review, pt has transitioned to comfort care with comfort feeds, hopeful for inpatient hospice placement. SLP to sign off - please reorder if needed.   Maxcine Ham, M.A. CCC-SLP [phone]  Maxcine Ham 03/09/2015, 1:31 PM

## 2015-03-09 NOTE — Clinical Social Work Note (Signed)
Clinical Social Work Assessment  Patient Details  Name: [name] MRN: [id number] Date of Birth: 11-26-1930  Date of referral:  03/08/15               Reason for consult:  End of Life/Hospice, Discharge Planning                Permission sought to share information with:  Family Supports, Magazine features editor Permission granted to share information::  Yes, Verbal Permission Granted  Name::     Jolaine Click  Agency::  [facility]  Relationship::  Daughter  Contact Information:  [phone]  Housing/Transportation Living arrangements for the past 2 months:  Single Family Home Source of Information:  Adult Children Patient Interpreter Needed:  None Criminal Activity/Legal Involvement Pertinent to Current Situation/Hospitalization:  No - Comment as needed Significant Relationships:  Adult Children Lives with:  Self Do you feel safe going back to the place where you live?  No (Hospice patient.) Need for family participation in patient care:  Yes (Comment) (Patient's daughter active in patient's care.)  Care giving concerns:  Patient's daughter expressed no concerns at this time.   Social Worker assessment / plan:  CSW received referral for comfort care and possible residential hospice placement. CSW spoke with patient's daughter regarding discharge disposition. Per patient's daughter, patient agreeable and comfortable with residential hospice placement for patient. CSW informed patient's daughter of limited facilities in surrounding area ([place] and [employer]). Patient's daughter understanding of possibility of patient to be placed in out-of-county facility. CSW to continue to follow and assist with discharge planning needs.  Employment status:  Retired Health and safety inspector:  Medicare PT Recommendations:  Not assessed at this time Information / Referral to community resources:  Other (Comment Required) (Residential hospice)  Patient/Family's Response  to care:  Patient's daughter understanding and agreeable to CSW plan of care.  Patient/Family's Understanding of and Emotional Response to Diagnosis, Current Treatment, and Prognosis:  Patient's daughter understanding and agreeable to CSW plan of care.  Emotional Assessment Appearance:  Other (Comment Required (CSW spoke with patient's daughter via phone as patient disoriented x4.) Attitude/Demeanor/Rapport:  Other (CSW spoke with patient's daughter via phone as patient disoriented x4.) Affect (typically observed):  Other (CSW spoke with patient's daughter via phone as patient disoriented x4.) Orientation:    Alcohol / Substance use:  Not Applicable Psych involvement (Current and /or in the community):  No (Comment) (Not appropriate on this admission.)  Discharge Needs  Concerns to be addressed:  No discharge needs identified Readmission within the last 30 days:  No Current discharge risk:  None Barriers to Discharge:  No Barriers Identified   Rojelio Brenner 03/09/2015, 11:33 AM [phone]

## 2015-03-09 NOTE — Consult Note (Signed)
[facility] Liaison: (late entry from 03/08/15 - was not able to access EPIC)  Received request from [place] for family interest in [facility]. Chart reviewed and met with daughter at bedside. Patient slept during most of visit and appeared comfortable. Patient requested ice chips several times and stated she hoped they had not melted. Daughter very attentive. Answered questions and offered support. Daughter shared history of patient's decline. Daughter expressed good understanding of residential hospice but was surprised by room and board charges. Daughter shares she has been told prognosis is likely two to five weeks. Offered support and encouragement. Daughter is aware no [facility] room availability at time of this visit 03/08/15. Daughter aware I will update CSW re availability this morning 03/09/15. Thank you. Erling Conte LCSW [phone]

## 2015-03-09 NOTE — Discharge Summary (Addendum)
Physician Discharge Summary  [name]:[id number] DOB: 01/03/1931 DOA: 03/03/2015  PCP: No primary care provider on file.  Admit date: 03/03/2015 Discharge date: 03/10/2015  Time spent: 45 minutes  Recommendations for Outpatient Follow-up:  Patient will be discharged to inpatient hospice facility.  Patient may to follow up with primary care provider as needed.  Patient should continue medications as prescribed. Continue feeds for comfort.   Discharge Diagnoses:  Septic shock/urinary tract infection Goals of care  COPD/interstitial pulmonary edema Dehydration Paroxysmal atrial fibrillation/CHF Acute renal failure Hypomagnesemia Hypokalemia Altered mental status Normocytic anemia  Discharge Condition: Stable  Diet recommendation: comfort  Filed Weights   03/05/15 0447 03/06/15 0431 03/07/15 0424  Weight: 56.5 kg (124 lb 9 oz) 56.3 kg (124 lb 1.9 oz) 57.3 kg (126 lb 5.2 oz)    History of present illness:  on 03/03/2015 by Dr. Jamie Kato Ms. [name] is an 81 y/o woman with a history of COPD, tobacco abuse, AF, complete heart block s/p Bi-V ICD/pacemaker insertion who was brought to the ED with worsening dyspnea and confusion. The patient had been with her family down in [facility], but 24 hours prior to her presentation she began to complain of dyspnea. She had run out of her Symbicort, so her daughter brought her home a day early to provide the medication, but only minimal relief was provided. The patient also began to develop some mild confusion, and as her symptoms progressed, EMS was called. In the ED, the patient was found to have a low-grade fever, a marked leukocytosis, and hypotension. A U/A was suggestive of urinary tract infection. PCCM was called for admission.   Hospital Course:  Septic shock/urinary tract infection Escherichia coli -Resolved -Was placed on ceftriaxone, but this was discontinued as patient was made comfort care  Goals of care -It seems  that patient has been praying for death -She is currently alert and oriented -Palliative care consultation and appreciated, patient transition to comfort care, currently pending inpatient hospice  COPD/Interstitial pulmonary edema -Patient's PCXR from 8/22 showed pulmonary edema Rt>>Lt -Chest x-ray 03/08/2015 shows improvement of COPD and CHF  Dehydration -See pulmonary edema -Previous hospital spoke with patient regarding the risks of sucking ice cubes including pneumonia and death. Patient accepted the risk.   Paroxysmal atrial fibrillation/CHF s/p Bi-V pacemaker -Patient has pacemaker only.  Confirmed with cardiology.  Acute renal failure  -Resolved  Hypomagnesemia -Resolved  Hypokalemia -No further lab problems  Altered Mental Status -Resolved, patient alert and oriented 4 -Possibly secondary to urinary tract infection and dehydration  Normocytic anemia -Hemoglobin currently remaining stable  Code Status: DO NOT RESUSCITATE  Procedures  None  Consults  Palliative care PCCM  Discharge Exam: Filed Vitals:   03/10/15 0510  BP: 128/70  Pulse: 76  Temp: 97.3 F (36.3 C)  Resp: 21   Exam  General: Well developed, thin appearing, NAD  HEENT: NCAT, mucous membranes dry   Cardiovascular: S1 S2 auscultated, RRR  Respiratory: Diminished but Clear to auscultation   Abdomen: Soft,umbilical hernia, nontender, nondistended, + bowel sounds  Extremities: warm dry without cyanosis clubbing or edema.   Skin: Multiple lacerations/scars   Neuro: AAOx3, nonfocal  Discharge Instructions     Medication List    STOP taking these medications        acetaminophen 325 MG tablet  Commonly known as:  TYLENOL     cephALEXin 500 MG capsule  Commonly known as:  KEFLEX     escitalopram 20 MG tablet  Commonly  known as:  LEXAPRO     HYDROcodone-acetaminophen 5-325 MG per tablet  Commonly known as:  NORCO/VICODIN      TAKE these medications         budesonide-formoterol 160-4.5 MCG/ACT inhaler  Commonly known as:  SYMBICORT  Inhale 2 puffs into the lungs 2 (two) times daily.       No Known Allergies    The results of significant diagnostics from this hospitalization (including imaging, microbiology, ancillary and laboratory) are listed below for reference.    Significant Diagnostic Studies: Dg Chest 1 View  03/03/2015   CLINICAL DATA:  Central line placement.  EXAM: CHEST  1 VIEW  COMPARISON:  Chest radiograph 03/03/2015  FINDINGS: Multi lead pacer apparatus overlies the left hemi thorax, leads are stable in position. Patient is rotated to the left. Stable cardiac and mediastinal contours. Coarse interstitial opacities bilaterally. Biapical pleural parenchymal thickening. New right IJ central venous catheter with tip projecting over the superior vena cava. No significant pleural effusion or pneumothorax. Lateral right rib fractures.  IMPRESSION: New right IJ central venous catheter tip projects over the superior vena cava.  Cardiomegaly and mild interstitial pulmonary edema.  Multiple age-indeterminate right lower lateral rib fractures. Recommend correlation for point tenderness.   Electronically Signed   By: Annia Belt M.D.   On: 03/03/2015 23:03   Dg Chest 2 View  03/03/2015   CLINICAL DATA:  Diarrhea for 2 days. Short of breath. Weakness and headache.  EXAM: CHEST  2 VIEW  COMPARISON:  None.  FINDINGS: Emphysema. Cardiomegaly. Aortic arch atherosclerosis. Pulmonary vascular congestion. LEFT subclavian 3 lead cardiac pacemaker. No focal consolidation. Monitoring leads project over the chest.  IMPRESSION: Emphysema and cardiomegaly with pulmonary vascular congestion.   Electronically Signed   By: Andreas Newport M.D.   On: 03/03/2015 18:39   Ct Head Wo Contrast  03/03/2015   CLINICAL DATA:  Weakness.  Headache.  EXAM: CT HEAD WITHOUT CONTRAST  TECHNIQUE: Contiguous axial images were obtained from the base of the skull through the vertex  without intravenous contrast.  COMPARISON:  None.  FINDINGS: Torticollis requires scanning in an oblique plane. This is similar to prior exams.  No mass lesion, mass effect, midline shift, hydrocephalus, hemorrhage. No acute territorial cortical ischemia/infarct. Atrophy and chronic ischemic white matter disease is present. Calvarium appears intact.  IMPRESSION: Atrophy and chronic ischemic white matter disease without acute intracranial abnormality. Nonstandard imaging plane due to torticollis.   Electronically Signed   By: Andreas Newport M.D.   On: 03/03/2015 20:20   Dg Chest Port 1 View  03/08/2015   CLINICAL DATA:  Shortness of breath and weakness, pulmonary edema, septic shock, acute renal failure  EXAM: PORTABLE CHEST - 1 VIEW  COMPARISON:  Portable chest x-ray of March 06, 2015  FINDINGS: The lungs are adequately inflated. The interstitial markings remain increased but are slightly less conspicuous. There are bilateral pleural effusions greater on the left than on the right. The retrocardiac region on the left remains dense. The permanent pacemaker is in reasonable position radiographically. The cardiac silhouette is not enlarged. The pulmonary vascularity is less engorged. The right internal jugular venous catheter tip projects over the midportion of the SVC. The bones are diffusely osteopenic.  IMPRESSION: COPD and CHF with improving pulmonary interstitial edema. Persistent left basilar atelectasis and bilateral pleural effusions greater on the left than on the right.   Electronically Signed   By: David  [place] M.D.   On: 03/08/2015 07:31   Dg  Chest Port 1 View  03/06/2015   CLINICAL DATA:  Shortness of breath.  Pulmonary edema.  EXAM: PORTABLE CHEST - 1 VIEW  COMPARISON:  03/03/2015 .  FINDINGS: Right IJ line in stable position. Cardiac pacer in stable position. Cardiomegaly with mild pulmonary vascular prominence. Bilateral pulmonary alveolar infiltrates and pleural effusions. Findings consistent  congestive heart failure.  IMPRESSION: 1. Right IJ line in stable position. 2. Cardiac pacer in stable position. Cardiomegaly with bilateral pulmonary alveolar infiltrates and pleural effusions consistent with congestive heart failure. Findings have progressed from prior exam .   Electronically Signed   By: Maisie Fus  Register   On: 03/06/2015 07:12    Microbiology: Recent Results (from the past 240 hour(s))  Culture, blood (routine x 2)     Status: None   Collection Time: 03/03/15  4:21 PM  Result Value Ref Range Status   Specimen Description BLOOD LEFT ANTECUBITAL  Final   Special Requests BOTTLES DRAWN AEROBIC ONLY  Final   Culture   Final    NO GROWTH 5 DAYS Performed at [facility]    Report Status 03/08/2015 FINAL  Final  Urine culture     Status: None   Collection Time: 03/03/15  7:34 PM  Result Value Ref Range Status   Specimen Description URINE, CATHETERIZED  Final   Special Requests NONE  Final   Culture   Final    >=100,000 COLONIES/mL ESCHERICHIA COLI Performed at [facility]    Report Status 03/06/2015 FINAL  Final   Organism ID, Bacteria ESCHERICHIA COLI  Final      Susceptibility   Escherichia coli - MIC*    AMPICILLIN 8 SENSITIVE Sensitive     CEFAZOLIN <=4 SENSITIVE Sensitive     CEFTRIAXONE <=1 SENSITIVE Sensitive     CIPROFLOXACIN <=0.25 SENSITIVE Sensitive     GENTAMICIN <=1 SENSITIVE Sensitive     IMIPENEM 0.5 SENSITIVE Sensitive     NITROFURANTOIN <=16 SENSITIVE Sensitive     TRIMETH/SULFA <=20 SENSITIVE Sensitive     AMPICILLIN/SULBACTAM 4 SENSITIVE Sensitive     PIP/TAZO <=4 SENSITIVE Sensitive     * >=100,000 COLONIES/mL ESCHERICHIA COLI  Culture, blood (routine x 2)     Status: None   Collection Time: 03/03/15 10:39 PM  Result Value Ref Range Status   Specimen Description BLOOD CENTRAL LINE  Final   Special Requests BOTTLES DRAWN AEROBIC AND ANAEROBIC 5CC  Final   Culture   Final    NO GROWTH 5 DAYS Performed at [facility]    Report Status 03/09/2015 FINAL  Final  MRSA PCR Screening     Status: None   Collection Time: 03/04/15  1:36 AM  Result Value Ref Range Status   MRSA by PCR NEGATIVE NEGATIVE Final    Comment:        The GeneXpert MRSA Assay (FDA approved for NASAL specimens only), is one component of a comprehensive MRSA colonization surveillance program. It is not intended to diagnose MRSA infection nor to guide or monitor treatment for MRSA infections.      Labs: Basic Metabolic Panel:  Recent Labs Lab 03/04/15 0510 03/05/15 0513 03/06/15 0417 03/07/15 0354 03/08/15 0355  NA 140 140 140 142 141  K 4.4 3.9 3.5 3.6 3.4*  CL 112* 111 112* 113* 110  CO2 21* 21* 22 24 22   GLUCOSE 104* 73 94 77 95  BUN 22* 31* 30* 24* 22*  CREATININE 1.69* 1.78* 1.54* 1.24* 1.00  CALCIUM 6.7* 7.5*  7.7* 8.2* 8.0*  MG 1.6* 2.2  --  2.0 1.9  PHOS 4.6  --   --  2.3* 2.5   Liver Function Tests:  Recent Labs Lab 03/03/15 1715  AST 92*  ALT 42  ALKPHOS 109  BILITOT 1.4*  PROT 6.2*  ALBUMIN 3.4*    Recent Labs Lab 03/03/15 2320  LIPASE 18*   No results for input(s): AMMONIA in the last 168 hours. CBC:  Recent Labs Lab 03/03/15 1715  03/04/15 0510 03/05/15 0513 03/06/15 0417 03/07/15 0354 03/08/15 0355  WBC 18.7*  < > 38.0* 16.0* 15.0* 9.4 8.3  NEUTROABS 17.5*  --   --  14.7* 13.6* 7.8* 6.4  HGB 12.0  < > 10.7* 9.5* 9.6* 10.2* 10.2*  HCT 37.8  < > 32.9* 29.7* 30.1* 31.0* 30.7*  MCV 91.1  < > 91.4 90.5 89.1 88.1 88.0  PLT 112*  < > 168 116* 122* 113* 109*  < > = values in this interval not displayed. Cardiac Enzymes: No results for input(s): CKTOTAL, CKMB, CKMBINDEX, TROPONINI in the last 168 hours. BNP: BNP (last 3 results) No results for input(s): BNP in the last 8760 hours.  ProBNP (last 3 results) No results for input(s): PROBNP in the last 8760 hours.  CBG: No results for input(s): GLUCAP in the last 168 hours.     SignedEdsel Petrin  [facility] 03/10/2015, 10:42 AM

## 2015-03-10 MED ORDER — HEPARIN SOD (PORK) LOCK FLUSH 10 UNIT/ML IV SOLN
10.0000 [IU] | INTRAVENOUS | Status: AC | PRN
  Administered 2015-03-10: 30 [IU]

## 2015-03-10 MED ORDER — HEPARIN SOD (PORK) LOCK FLUSH 10 UNIT/ML IV SOLN: 10.0000 [IU] | INTRAVENOUS | Status: DC | PRN

## 2015-03-10 NOTE — Progress Notes (Signed)
[facility], 03/10/2015. Please see full discharge summary dictated on 03/09/2015. No changes. Patient stable for discharge.  Time spent: 10 minutes  Kit Mollett D.O. [facility] Pager [phone]  If 7PM-7AM, please contact night-coverage [email or web address] Password [email or web address] 03/10/2015, 10:16 AM

## 2015-03-10 NOTE — Consult Note (Signed)
[facility] Liaison: [employer] available today. Daughter has completed paper work and MD has confirmed patient does not have an active ICD. Amended discharge summary has been faxed. CSW confirming with RN that access has been capped off. CSW to arrange transport.   RN please call report to [phone].  Thank you.  Forrestine Him, LCSW  [phone]

## 2015-03-10 NOTE — Progress Notes (Signed)
Report given to Nettie Elm, [employer] place.

## 2015-03-10 NOTE — Clinical Social Work Note (Signed)
Clinical Social Worker facilitated patient discharge including contacting patient family and facility to confirm patient discharge plans.  Clinical information faxed to facility and family agreeable with plan.  CSW arranged ambulance transport via [facility] to [facility].  RN to call report prior to discharge.  Clinical Social Worker will sign off for now as social work intervention is no longer needed. Please consult us again if new need arises.  Jesse Amee Boothe, LCSW [phone] 

## 2015-03-10 NOTE — Progress Notes (Signed)
Discharge to [facility] transported by [facility].

## 2015-03-22 ENCOUNTER — Ambulatory Visit: Payer: Self-pay | Admitting: Internal Medicine

## 2015-03-22 DIAGNOSIS — I482 Chronic atrial fibrillation, unspecified: Secondary | ICD-10-CM

## 2015-04-15 DEATH — deceased

## 2015-04-26 ENCOUNTER — Other Ambulatory Visit: Payer: Self-pay | Admitting: Family Medicine

## 2015-04-26 DIAGNOSIS — R519 Headache, unspecified: Secondary | ICD-10-CM

## 2015-04-26 DIAGNOSIS — R51 Headache: Principal | ICD-10-CM

## 2015-05-04 ENCOUNTER — Telehealth: Payer: Self-pay | Admitting: Cardiology

## 2015-05-04 ENCOUNTER — Encounter: Payer: Medicare Other | Admitting: *Deleted

## 2015-05-04 NOTE — Telephone Encounter (Signed)
Pt daughter informed me that pt had passed away.

## 2015-08-29 IMAGING — CT CT CHEST W/ CM
2 of 5 series · 8 of 36 positions shown, 10 images · IV contrast (Iodine)
Comparison: Radiographs dated [DATE]

CLINICAL DATA: Multiple trauma after falling down several steps.
Scalp hematoma. Back pain.

EXAM:
CT CHEST, ABDOMEN, AND PELVIS WITH CONTRAST
TECHNIQUE: Multidetector CT imaging of the chest, abdomen and pelvis was
performed following the standard protocol during bolus
administration of intravenous contrast.
CONTRAST:  80mL OMNIPAQUE IOHEXOL 300 MG/ML  SOLN

[Series 201: cap with, idose (2) · axial · 0.72mm/px · z∈[-532,-127]mm · 5 of 111 slices shown, 7 images]
[im 15/111  mediastinal]
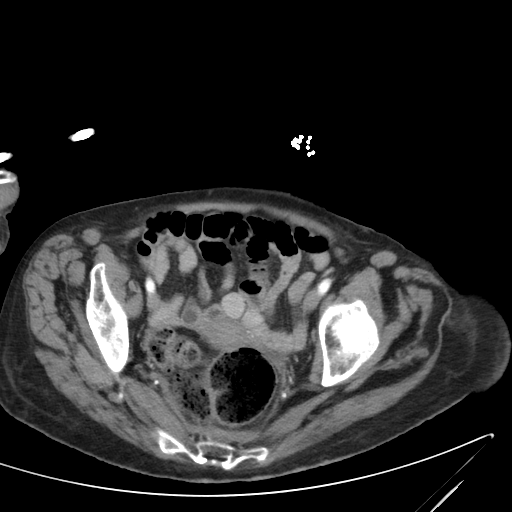
[im 15/111  lung]
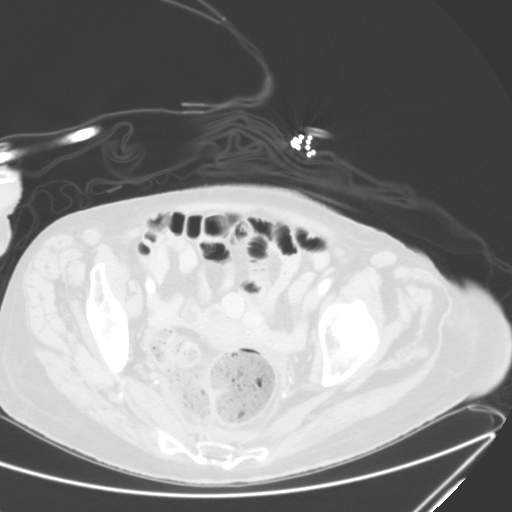
[im 37/111  lung]
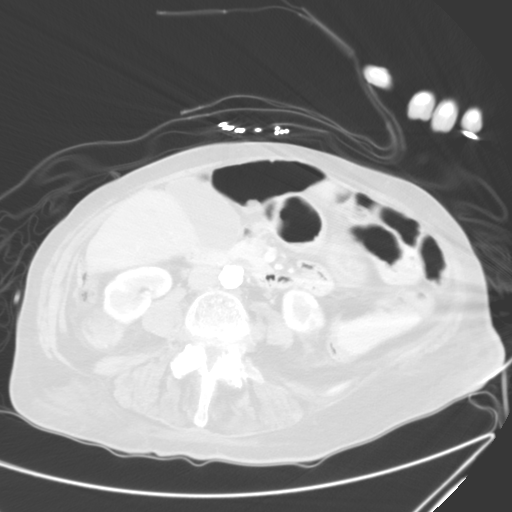
[im 59/111  lung]
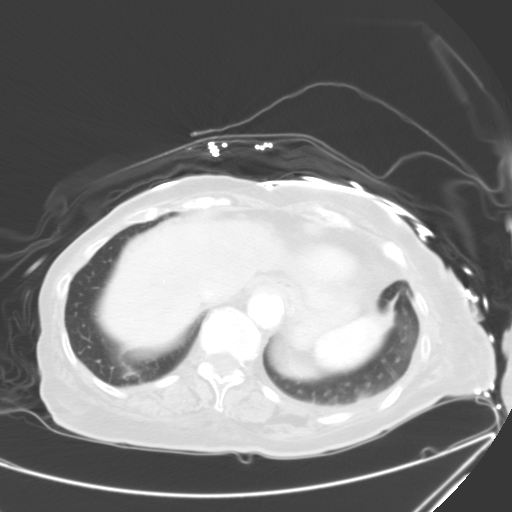
[im 74/111  lung]
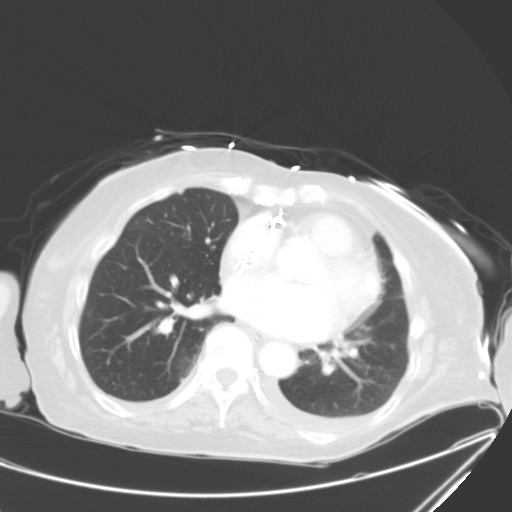
[im 96/111  mediastinal]
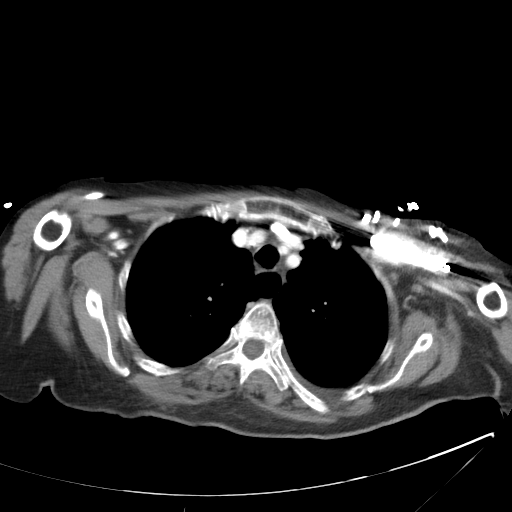
[im 96/111  lung]
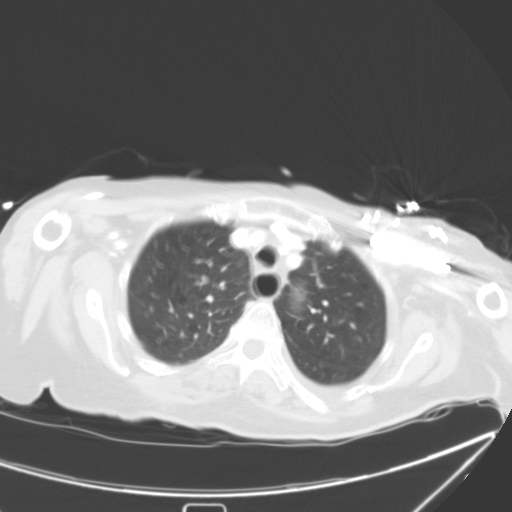

[Series 203: coronals, idose (2) · coronal · 0.45mm/px · 3 of 75 slices shown]
[im 15/75  lung]
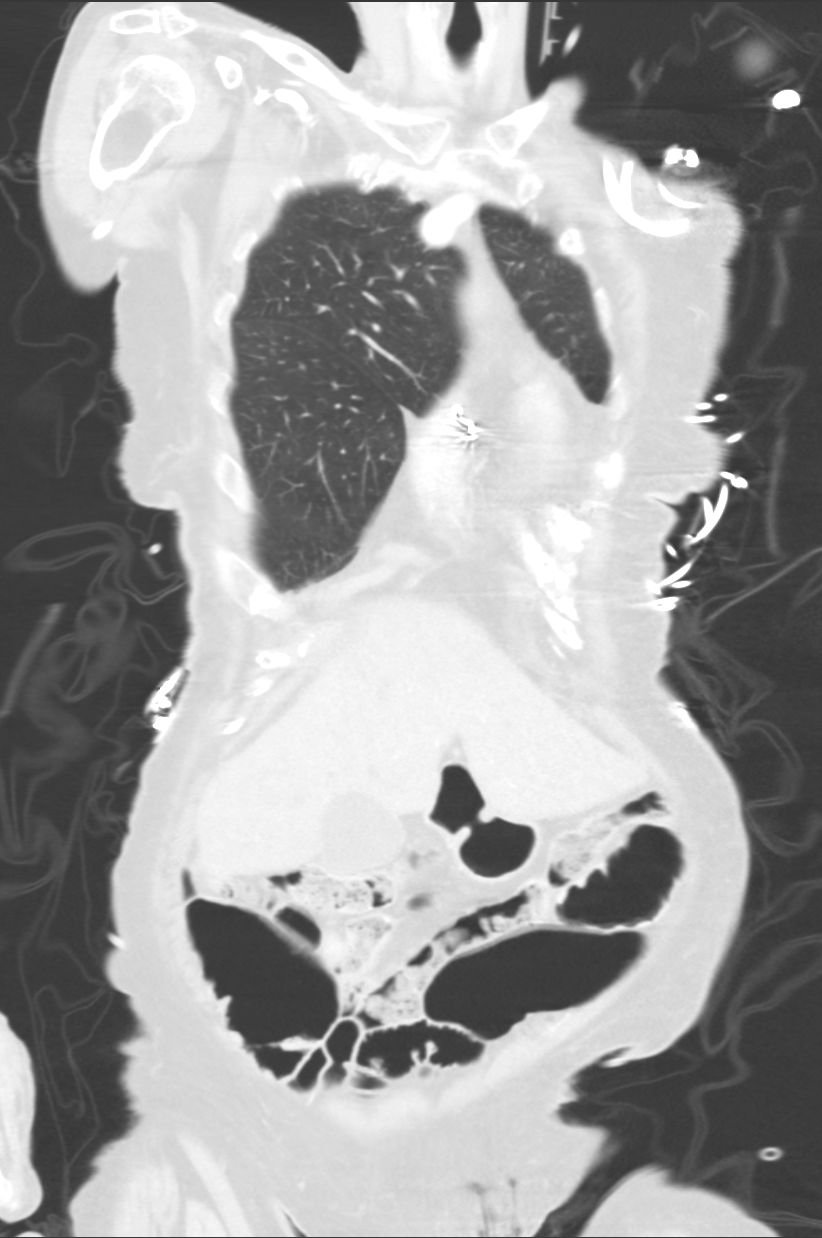
[im 30/75  lung]
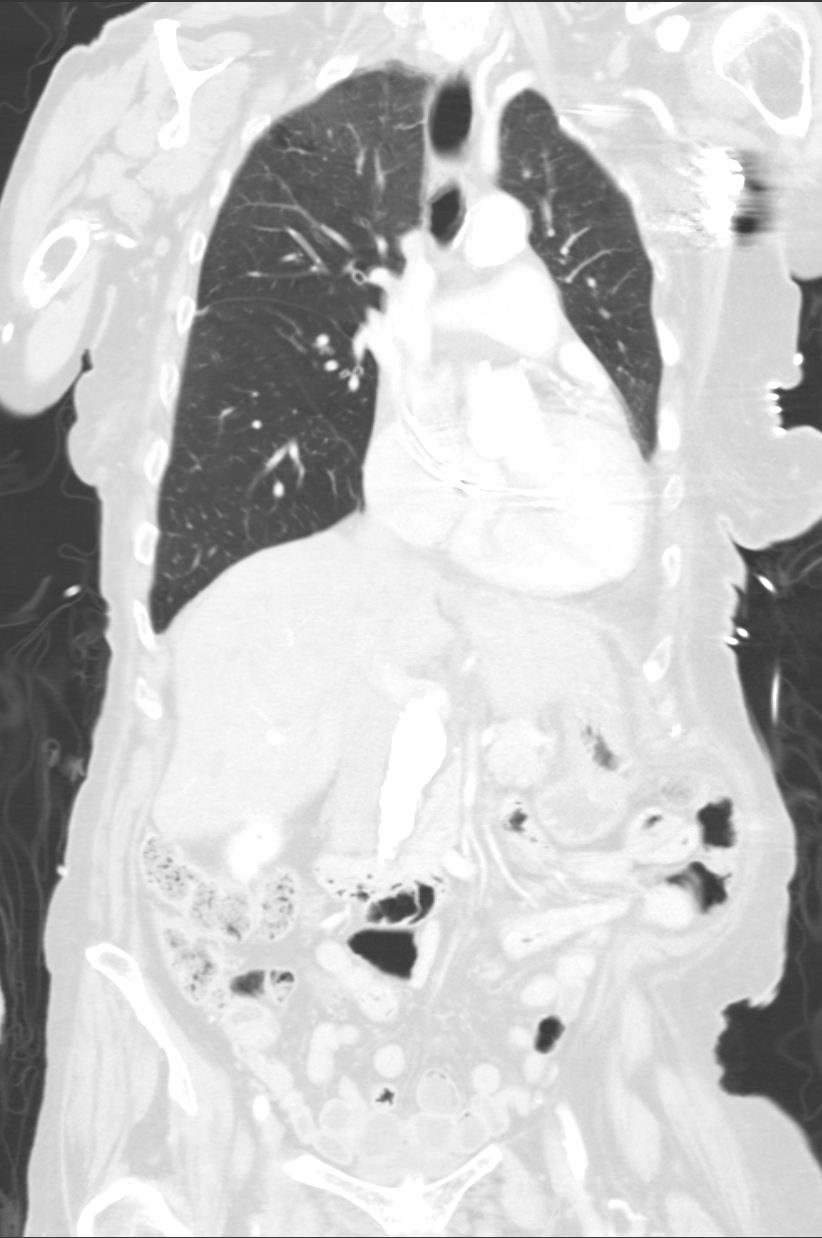
[im 45/75  lung]
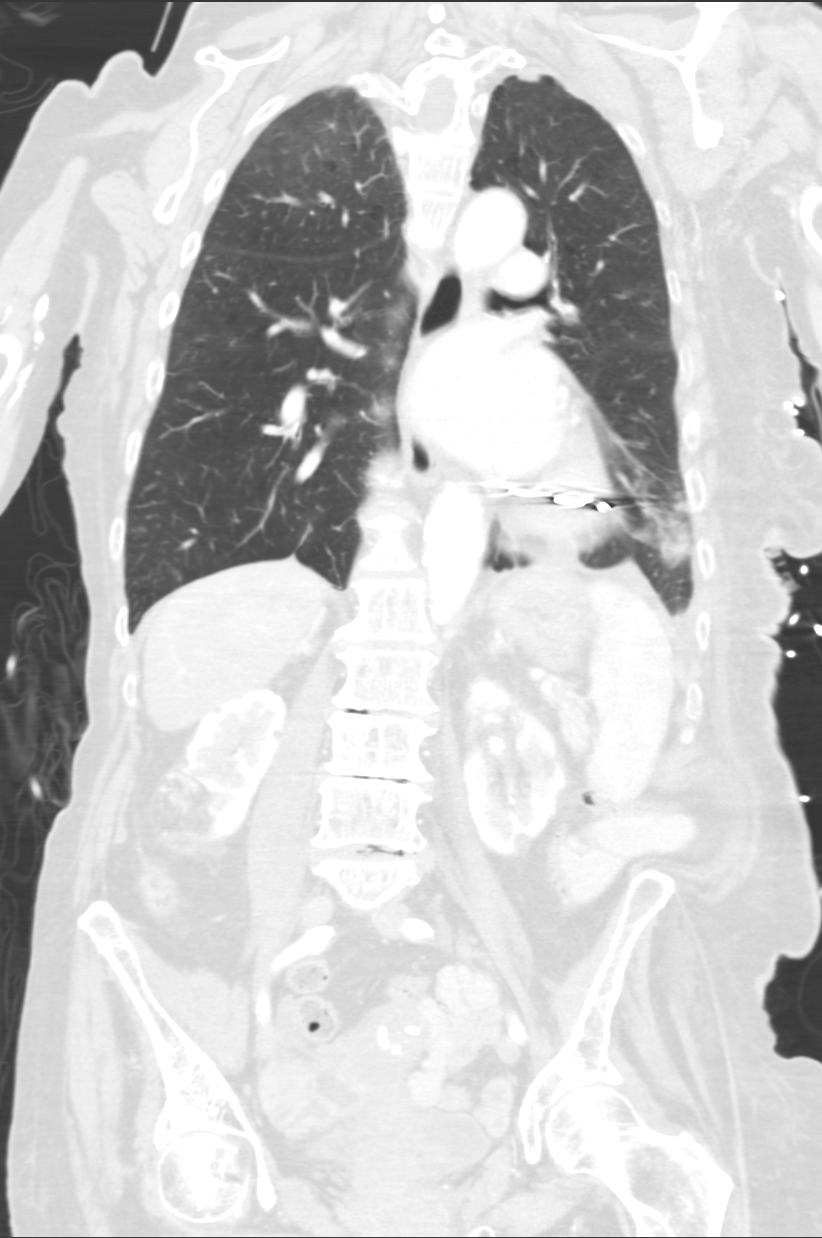

[8 of 36 positions shown; findings below may reference images not displayed]

FINDINGS: CT CHEST FINDINGS

There is an acute compression fracture of the T11 vertebral body
with 4 mm of protrusion of the posterior aspect of T11 into the
spinal canal.

There are multiple old bilateral rib fractures. There are fractures
of the lateral aspects of the right eighth and ninth ribs which
could be acute. There is no pneumothorax. There are patchy areas of
infiltrate or atelectasis at the left lung base.

There are multiple small pulmonary nodules. The nodules on the right
are unchanged. There is a new 3 mm nodule in the left lower lobe on
image 26 of series 202. There is a stable 4 mm nodule in the left
lower lobe on image 27 of 2 O2. There is a new 3 mm nodule on image
39.

There is extensive calcification in the thoracic aorta. Overall
heart size is normal. Pacemaker in place.

There is a small left pleural effusion and a tiny right effusion.

CT ABDOMEN AND PELVIS FINDINGS

Liver, spleen, pancreas, and right adrenal gland are normal. There
is an inhomogeneous 3.3 x 1.8 x 1.5 cm lesion in the left adrenal
gland, minimally increased in size since 0332.

There is a 3.7 x 3.6 x 3.5 cm angiomyolipoma arising from the
posterior aspect of the mid right kidney. There are several small
cysts in the right kidney. There are also several cysts in the left
kidney with the largest being 3.1 in the upper pole.

The bowel is normal except for an umbilical hernia containing a
small portion of the transverse colon and fecal impaction in the
rectum.

The bladder, uterus, and ovaries are normal except for
calcifications in the uterus.

There is multilevel degenerative disc and joint disease in the
lumbar spine with moderate spinal stenosis at L4-5 due to
spondylolisthesis.
IMPRESSION: [:
IMPRESSION: [
1. Moderate acute compression fracture of T11 with slight protrusion
of bone into the spinal canal.
2. Possible acute fractures of the lateral aspects of the right
eighth and ninth ribs.
3. Patchy areas of atelectasis or infiltrate at the left lung base.
Multiple pulmonary nodules, most likely benign and most of which are
stable.
4. Chronic mass in the left adrenal gland, minimally progressed
since the prior study and probably representing an adenoma.
5. 3.7 cm angiomyolipoma of the lower pole the right kidney.

## 2015-08-29 IMAGING — CT CT CERVICAL SPINE W/O CM
4 of 6 series · 13 of 33 positions shown, 15 images · non-contrast
Comparison: None.

CLINICAL DATA: Head injury and hematoma after fall down steps. No
report of loss of consciousness.

EXAM:
CT HEAD WITHOUT CONTRAST
CT CERVICAL SPINE WITHOUT CONTRAST
TECHNIQUE: Multidetector CT imaging of the head and cervical spine was
performed following the standard protocol without intravenous
contrast. Multiplanar CT image reconstructions of the cervical spine
were also generated.

[Series 302: soft tissue, idose (2) · axial · 0.39mm/px · z∈[-86,+22]mm · 3 of 109 slices shown]
[im 28/109  soft-tissue]
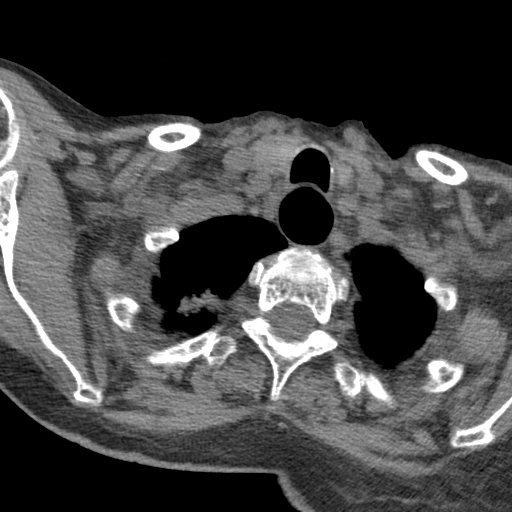
[im 55/109  soft-tissue]
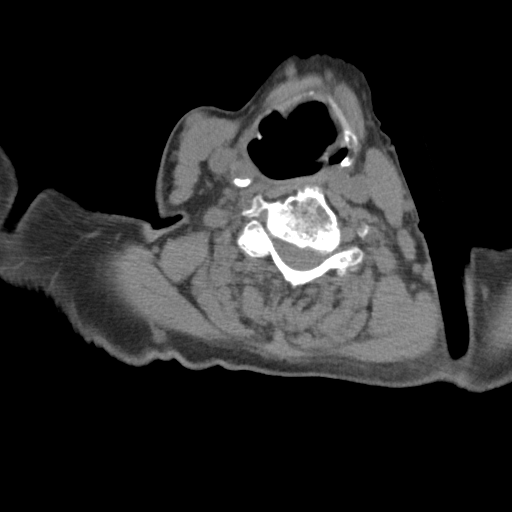
[im 82/109  soft-tissue]
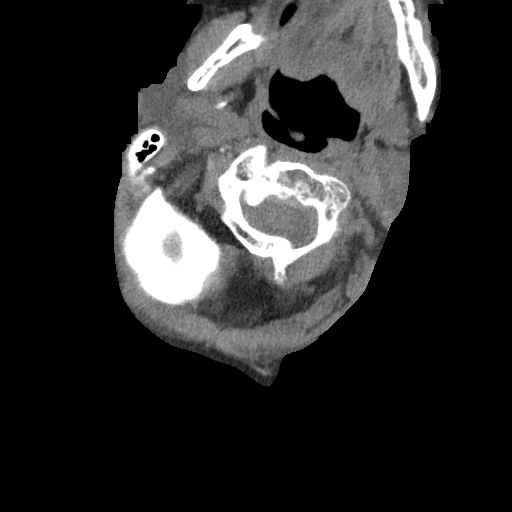

[Series 303: sagittal, idose (2) · sagittal · 0.34mm/px · 5 of 43 slices shown, 6 images]
[im 15/43  bone]
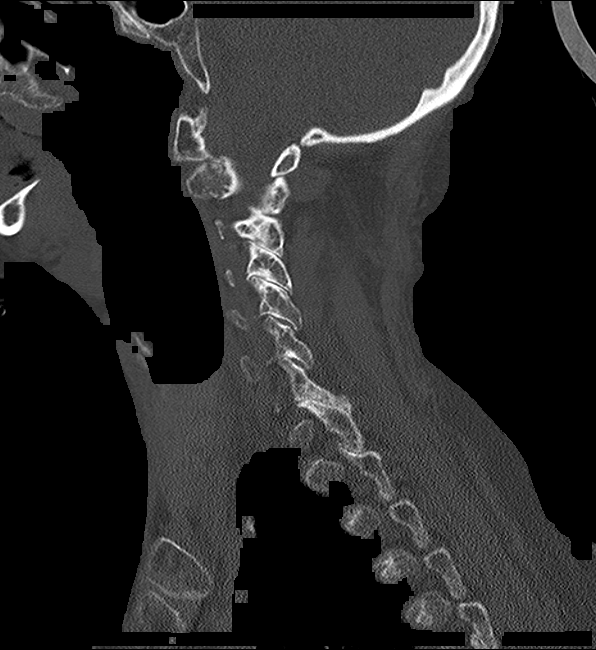
[im 18/43  bone]
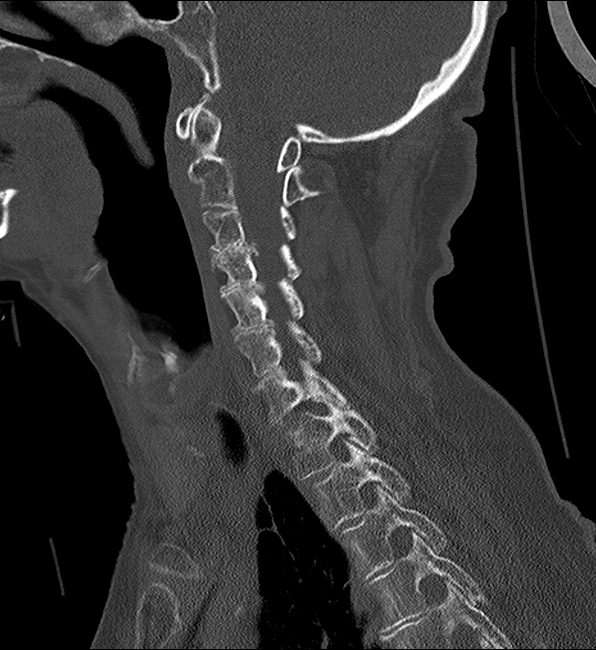
[im 22/43  soft-tissue]
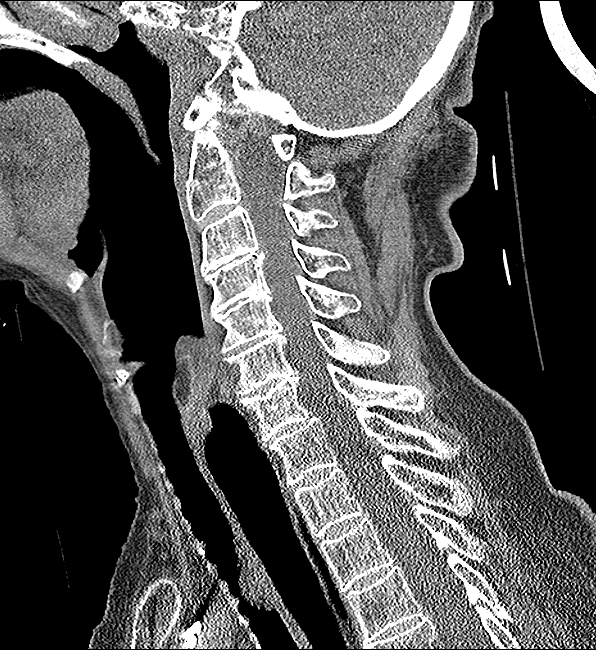
[im 22/43  bone]
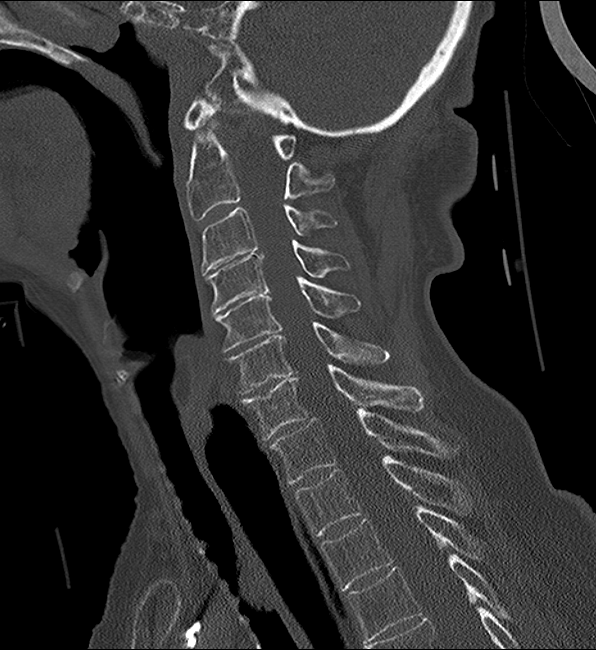
[im 25/43  bone]
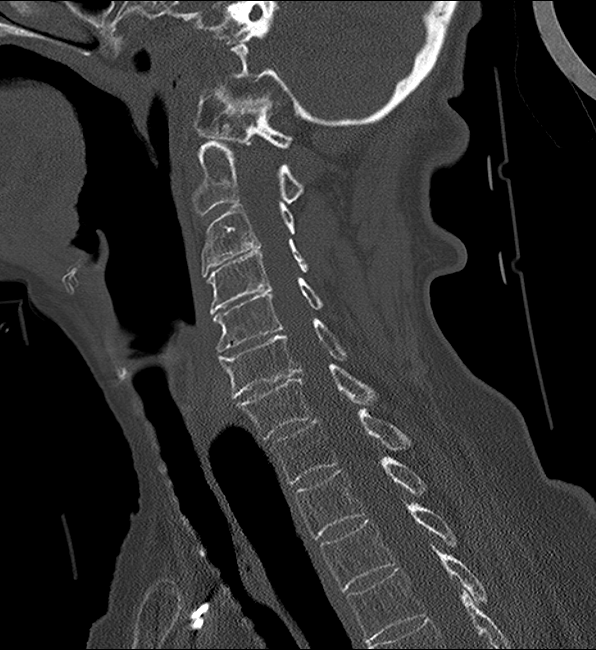
[im 29/43  bone]
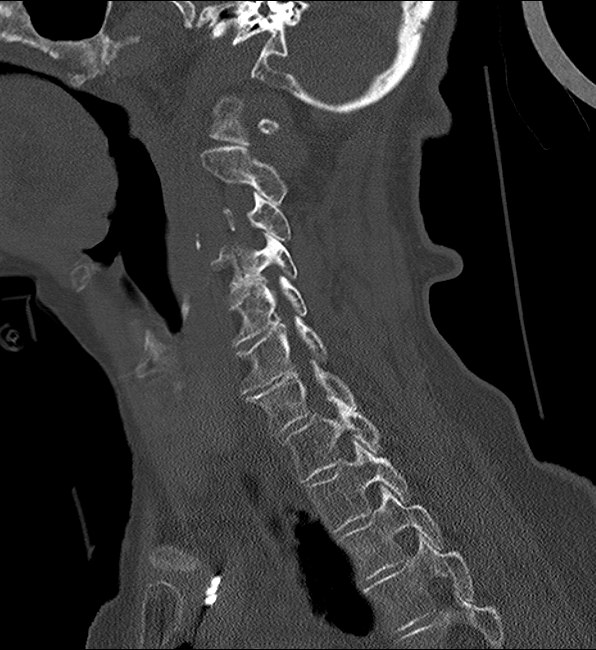

[Series 304: coronal, idose (2) · coronal · 0.36mm/px · 3 of 51 slices shown]
[im 11/51  bone]
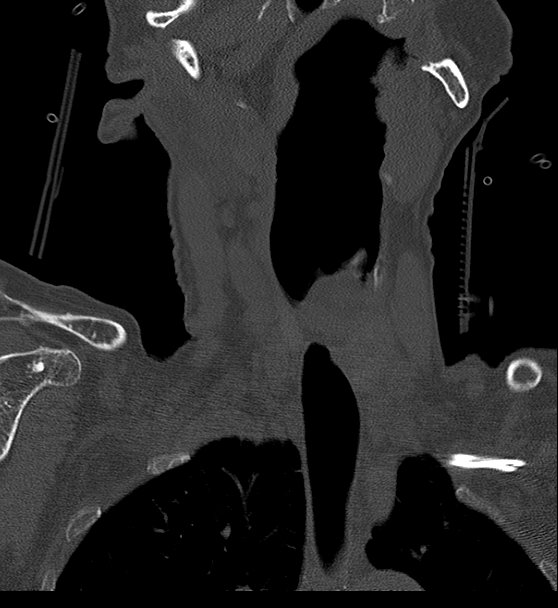
[im 21/51  bone]
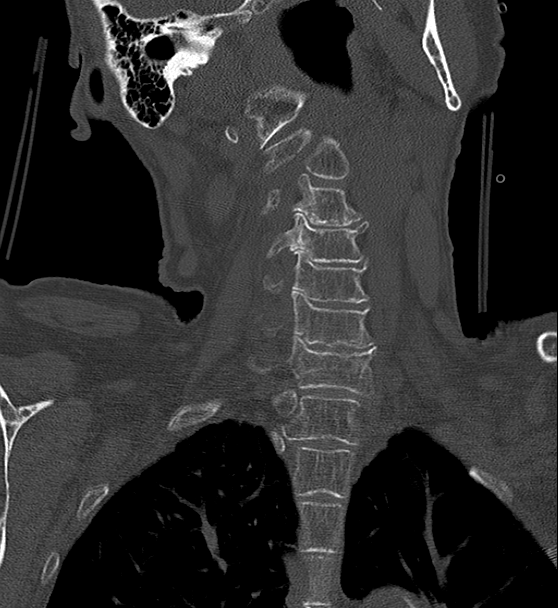
[im 31/51  bone]
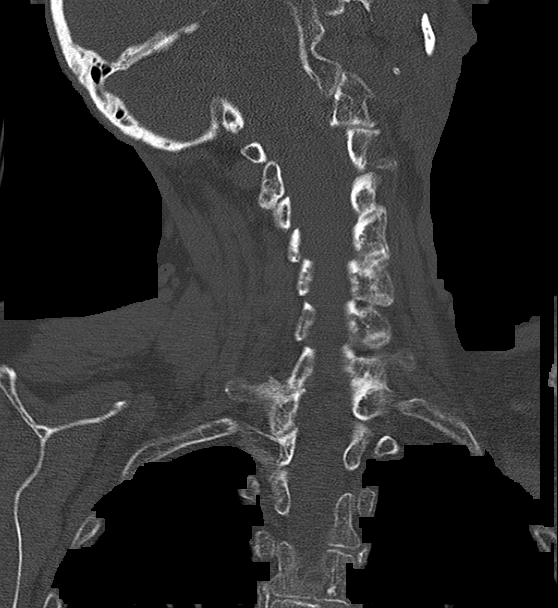

[Series 305: orthogonals, idose (2) · axial · 0.49mm/px · z∈[-93,-32]mm · 2 of 96 slices shown, 3 images]
[im 32/96  soft-tissue]
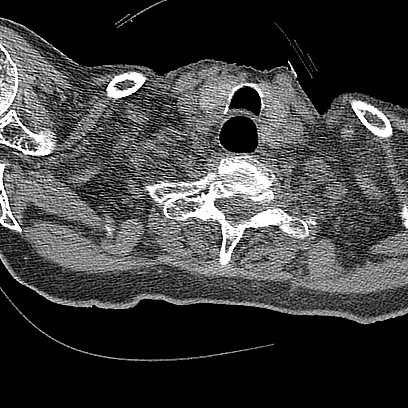
[im 32/96  bone]
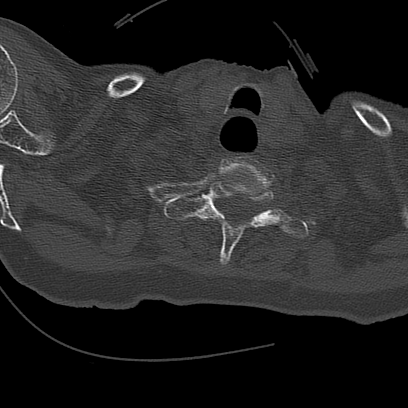
[im 64/96  bone]
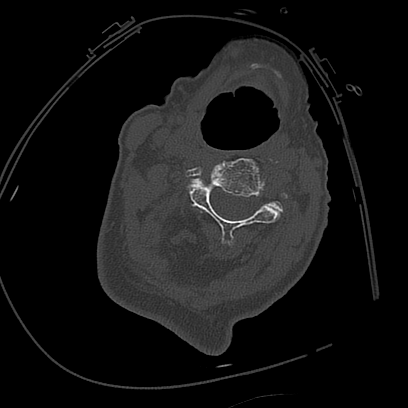

[13 of 33 positions shown; findings below may reference images not displayed]

FINDINGS: CT HEAD FINDINGS

Bony calvarium appears intact. Mild diffuse cortical atrophy is
noted. Mild chronic ischemic white matter disease is noted. No mass
effect or midline shift is noted. Ventricular size is within normal
limits. There is no evidence of mass lesion or hemorrhage. Low
density is noted in the right cerebellar hemisphere concerning for
infarction of indeterminate age.

CT CERVICAL SPINE FINDINGS

Mildly displaced fracture is seen involving the right side of the C2
vertebral body which extends into vertebral artery foramen. There
also appears to be a nondisplaced fracture involving the left side
of the C2 vertebral body at the base of the dens. There appears to
be malalignment or some degree of rotary subluxation of C1-2 which
may be traumatic in origin. Degenerative disc disease is noted at
C3-4, C4-5, C5-6 and C6-7.
IMPRESSION: Mild diffuse cortical atrophy. Mild chronic ischemic white matter
disease. Low density is noted in right cerebellar hemisphere
concerning for infarction of indeterminate age. MRI may be performed
for further evaluation.

Mildly displaced fracture is seen involving the right side of the C2
vertebral body which extends into the vertebral artery foramen.
There also appears to be a nondisplaced fracture involving the left
side of the C2 vertebral body at the base of the dens. Malalignment
or rotary subluxation of the atlantoaxial joint is noted which may
be traumatic in origin. Critical Value/emergent results were called
to Dr. PIOTR ALAMEDA , who verbally acknowledged these results.
